# Patient Record
Sex: Female | Born: 1960 | Race: White | Hispanic: No | Marital: Married | Smoking: Never smoker
Health system: Southern US, Community
[De-identification: ages and names within clinical notes are randomized; demographics above are authoritative.]

## PROBLEM LIST (undated history)

## (undated) DIAGNOSIS — J069 Acute upper respiratory infection, unspecified: Secondary | ICD-10-CM

## (undated) DIAGNOSIS — M2242 Chondromalacia patellae, left knee: Secondary | ICD-10-CM

## (undated) DIAGNOSIS — J45909 Unspecified asthma, uncomplicated: Secondary | ICD-10-CM

## (undated) DIAGNOSIS — E7800 Pure hypercholesterolemia, unspecified: Secondary | ICD-10-CM

## (undated) DIAGNOSIS — N950 Postmenopausal bleeding: Secondary | ICD-10-CM

## (undated) DIAGNOSIS — Z87442 Personal history of urinary calculi: Secondary | ICD-10-CM

## (undated) DIAGNOSIS — M2241 Chondromalacia patellae, right knee: Secondary | ICD-10-CM

## (undated) HISTORY — DX: Chondromalacia patellae, left knee: M22.42

## (undated) HISTORY — PX: OTHER SURGICAL HISTORY: SHX169

## (undated) HISTORY — DX: Pure hypercholesterolemia, unspecified: E78.00

## (undated) HISTORY — DX: Chondromalacia patellae, right knee: M22.41

## (undated) HISTORY — DX: Unspecified asthma, uncomplicated: J45.909

---

## 1994-02-10 DIAGNOSIS — T8859XA Other complications of anesthesia, initial encounter: Secondary | ICD-10-CM

## 1994-02-10 HISTORY — DX: Other complications of anesthesia, initial encounter: T88.59XA

## 1996-02-11 HISTORY — PX: TUBAL LIGATION: SHX77

## 1998-10-31 ENCOUNTER — Other Ambulatory Visit: Admission: RE | Admit: 1998-10-31 | Discharge: 1998-10-31 | Payer: Self-pay | Admitting: Gynecology

## 1998-11-19 ENCOUNTER — Encounter: Admission: RE | Admit: 1998-11-19 | Discharge: 1999-02-17 | Payer: Self-pay | Admitting: Gynecology

## 2000-04-07 ENCOUNTER — Other Ambulatory Visit: Admission: RE | Admit: 2000-04-07 | Discharge: 2000-04-07 | Payer: Self-pay | Admitting: Gynecology

## 2001-04-07 ENCOUNTER — Other Ambulatory Visit: Admission: RE | Admit: 2001-04-07 | Discharge: 2001-04-07 | Payer: Self-pay | Admitting: Gynecology

## 2001-04-07 ENCOUNTER — Ambulatory Visit (HOSPITAL_COMMUNITY): Admission: RE | Admit: 2001-04-07 | Discharge: 2001-04-07 | Payer: Self-pay | Admitting: Gynecology

## 2001-04-07 ENCOUNTER — Encounter: Payer: Self-pay | Admitting: Gynecology

## 2002-04-11 ENCOUNTER — Other Ambulatory Visit: Admission: RE | Admit: 2002-04-11 | Discharge: 2002-04-11 | Payer: Self-pay | Admitting: Gynecology

## 2002-12-12 ENCOUNTER — Inpatient Hospital Stay (HOSPITAL_COMMUNITY): Admission: EM | Admit: 2002-12-12 | Discharge: 2002-12-15 | Payer: Self-pay | Admitting: Emergency Medicine

## 2003-09-01 ENCOUNTER — Other Ambulatory Visit: Admission: RE | Admit: 2003-09-01 | Discharge: 2003-09-01 | Payer: Self-pay | Admitting: Gynecology

## 2004-03-04 ENCOUNTER — Emergency Department (HOSPITAL_COMMUNITY): Admission: EM | Admit: 2004-03-04 | Discharge: 2004-03-04 | Payer: Self-pay | Admitting: Emergency Medicine

## 2004-10-01 ENCOUNTER — Other Ambulatory Visit: Admission: RE | Admit: 2004-10-01 | Discharge: 2004-10-01 | Payer: Self-pay | Admitting: Gynecology

## 2006-03-25 ENCOUNTER — Other Ambulatory Visit: Admission: RE | Admit: 2006-03-25 | Discharge: 2006-03-25 | Payer: Self-pay | Admitting: Gynecology

## 2006-05-21 ENCOUNTER — Emergency Department (HOSPITAL_COMMUNITY): Admission: EM | Admit: 2006-05-21 | Discharge: 2006-05-21 | Payer: Self-pay | Admitting: Emergency Medicine

## 2007-04-04 HISTORY — PX: ENDOMETRIAL ABLATION: SHX621

## 2007-05-10 ENCOUNTER — Ambulatory Visit (HOSPITAL_COMMUNITY): Admission: RE | Admit: 2007-05-10 | Discharge: 2007-05-10 | Payer: Self-pay | Admitting: Family Medicine

## 2007-05-14 ENCOUNTER — Other Ambulatory Visit: Admission: RE | Admit: 2007-05-14 | Discharge: 2007-05-14 | Payer: Self-pay | Admitting: Gynecology

## 2008-12-26 ENCOUNTER — Other Ambulatory Visit: Admission: RE | Admit: 2008-12-26 | Discharge: 2008-12-26 | Payer: Self-pay | Admitting: Gynecology

## 2008-12-26 ENCOUNTER — Encounter: Payer: Self-pay | Admitting: Gynecology

## 2008-12-26 ENCOUNTER — Ambulatory Visit: Payer: Self-pay | Admitting: Gynecology

## 2009-08-24 IMAGING — CT CT HEAD W/O CM
1 series · 16 of 30 positions shown, 20 images · non-contrast
Comparison: None

CLINICAL DATA: HEAD INJURY

CT HEAD WITHOUT CONTRAST
TECHNIQUE: Contiguous axial images were obtained from the base of
the skull through the vertex without contrast

[Series 2: headtrauma 4.8 h37s · axial · 0.45mm/px · z∈[+1268,+1406]mm · 16 of 30 slices shown, 20 images]
[im 2/30  brain]
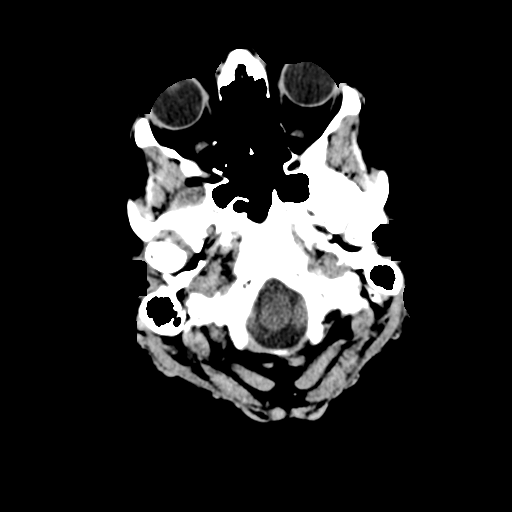
[im 2/30  bone]
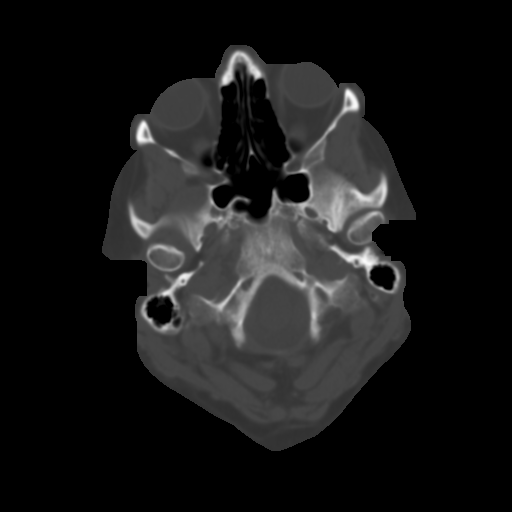
[im 4/30  brain]
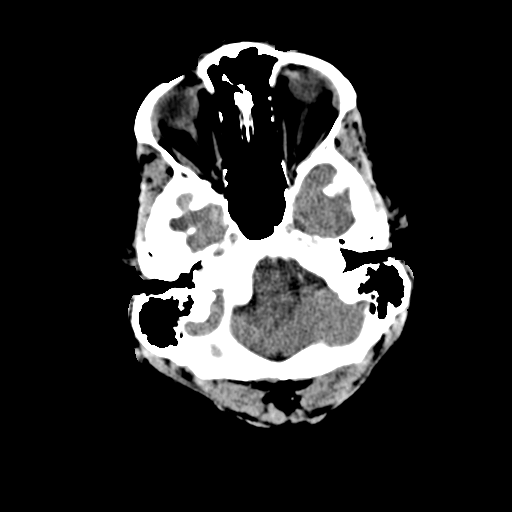
[im 6/30  brain]
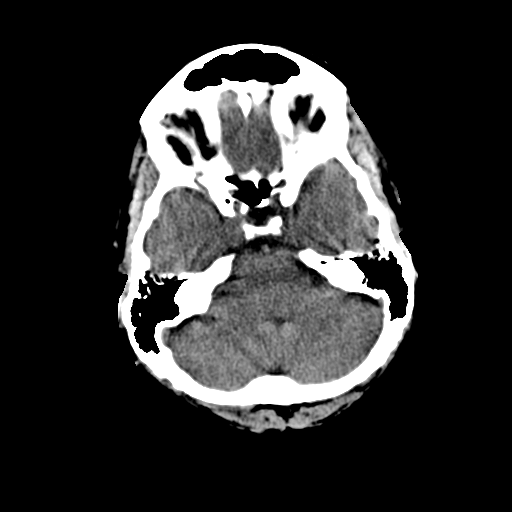
[im 8/30  brain]
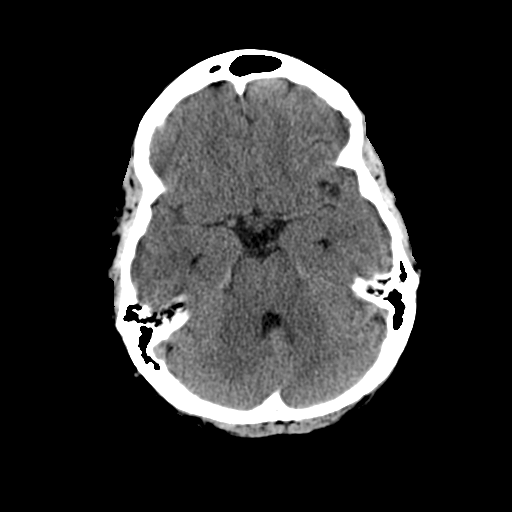
[im 9/30  brain]
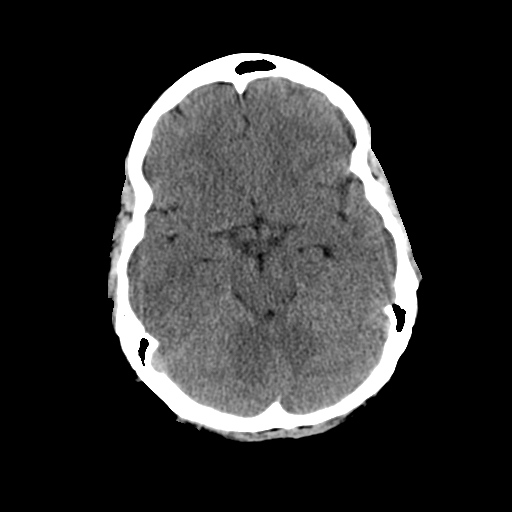
[im 9/30  bone]
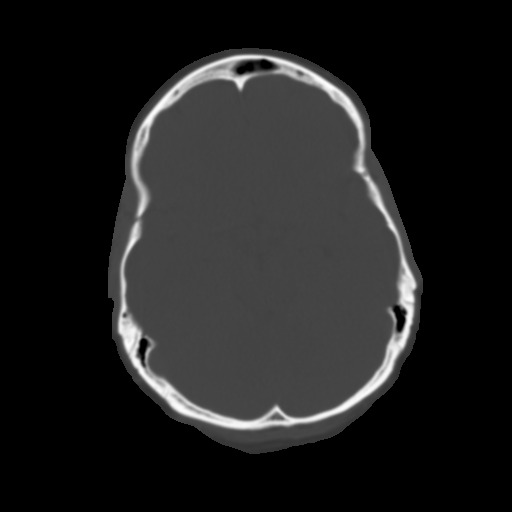
[im 11/30  brain]
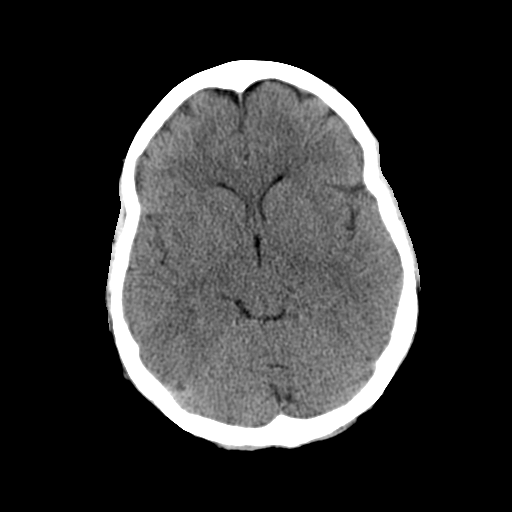
[im 13/30  brain]
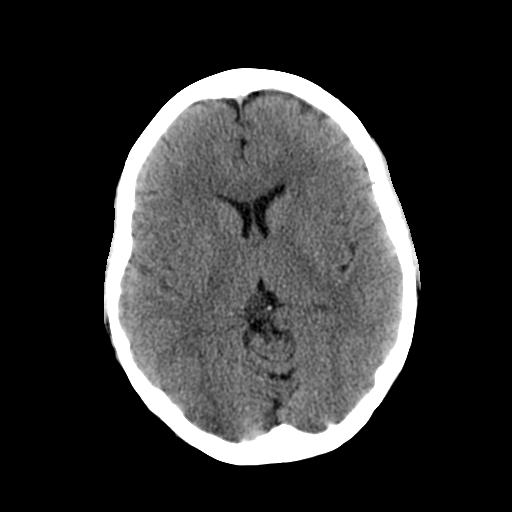
[im 15/30  brain]
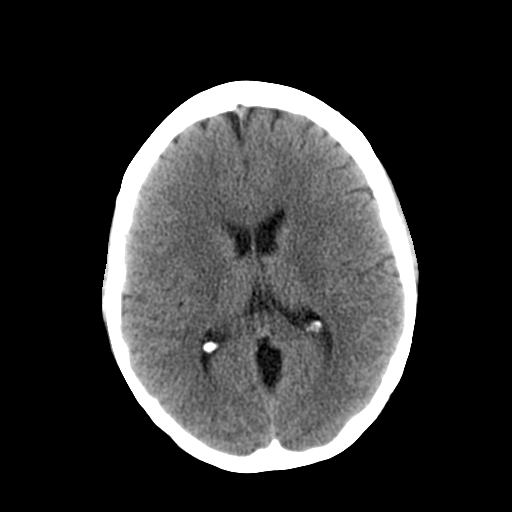
[im 16/30  brain]
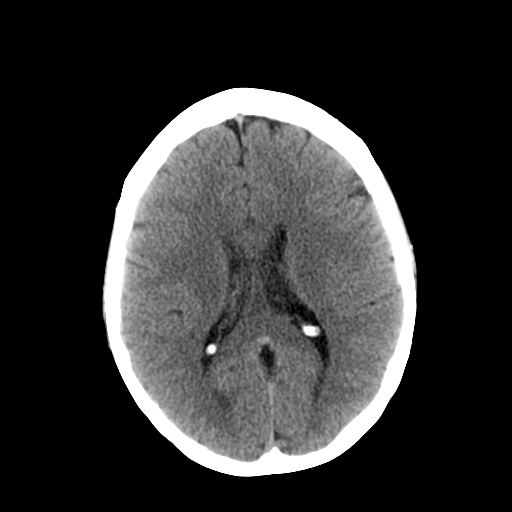
[im 16/30  bone]
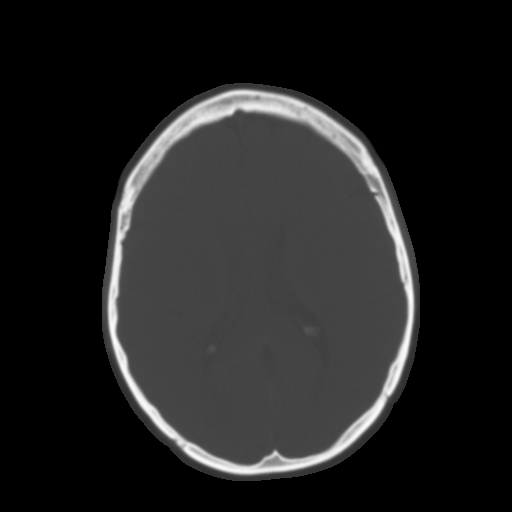
[im 18/30  brain]
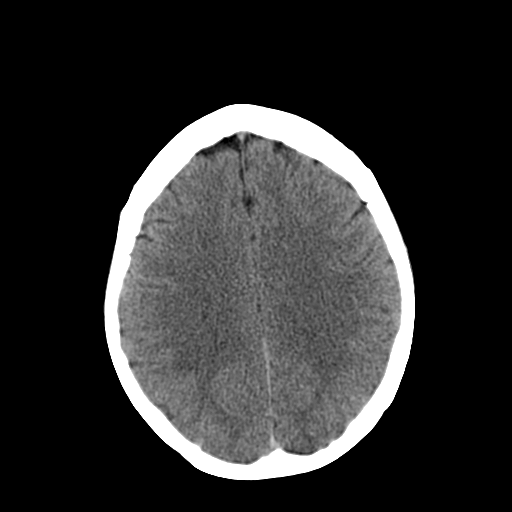
[im 20/30  brain]
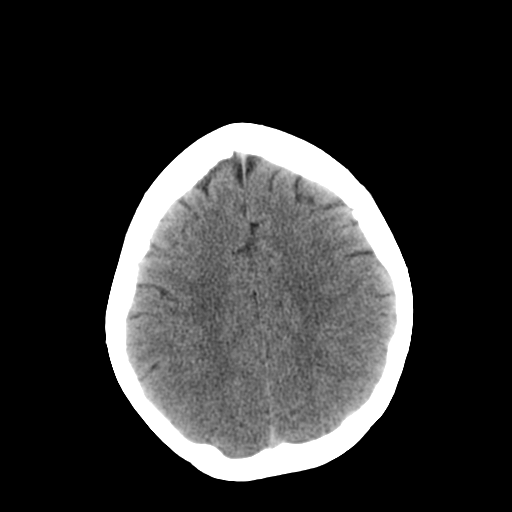
[im 22/30  brain]
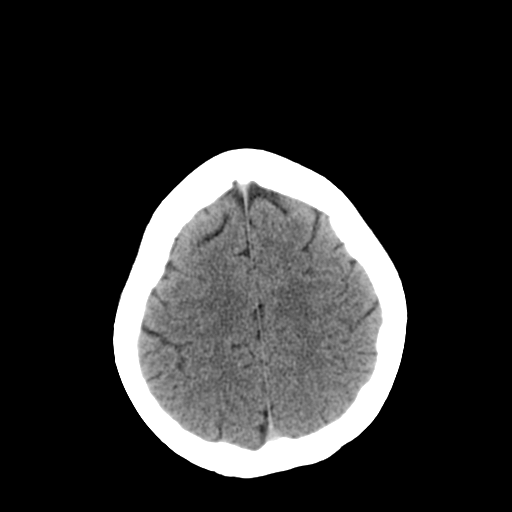
[im 23/30  brain]
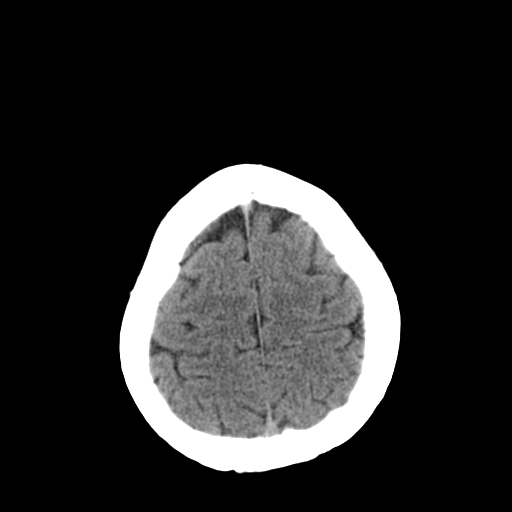
[im 23/30  bone]
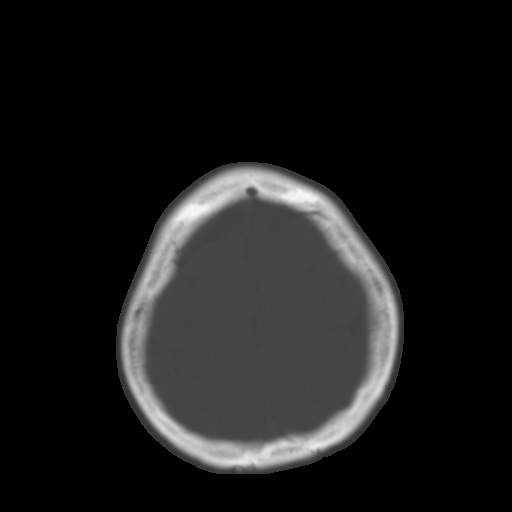
[im 25/30  brain]
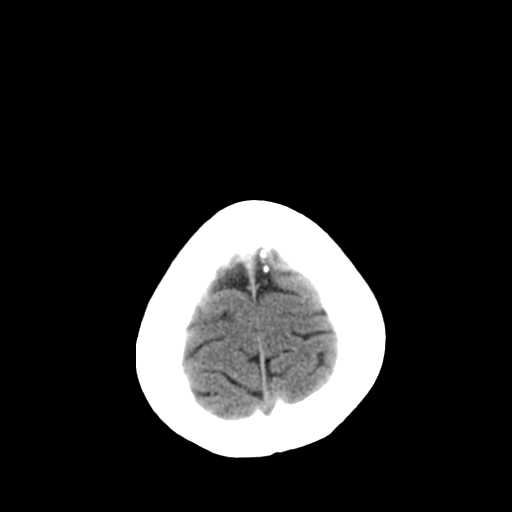
[im 27/30  brain]
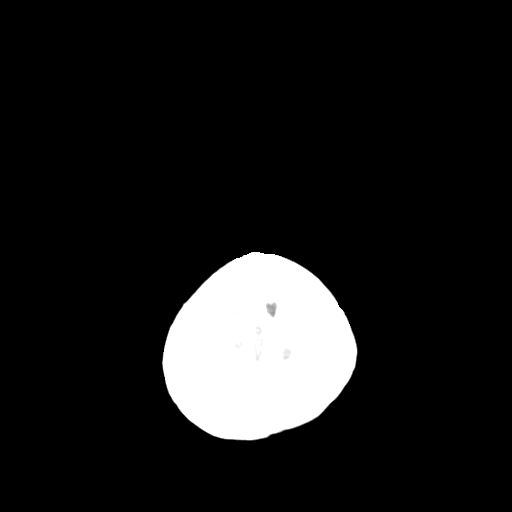
[im 29/30  brain]
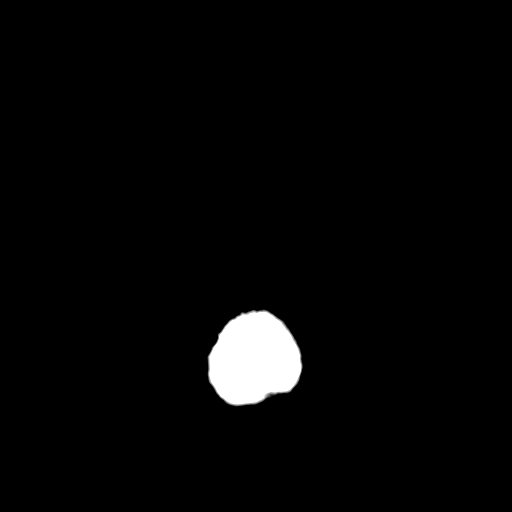

[16 of 30 positions shown; findings below may reference images not displayed]

FINDINGS: The brain has a normal appearance without evidence for
hemorrhage, acute infarction, hydrocephalus, or mass lesion.  There
is no extra axial fluid collection.  The skull and paranasal
sinuses are normal.
IMPRESSION: Normal CT of the head without contrast.

## 2009-08-24 IMAGING — CR DG CERVICAL SPINE COMPLETE 4+V
6 series · 6 of 6 positions shown · non-contrast
Comparison: None available

CLINICAL DATA: Headache, neck pain, hit head with to to see cover.

CERVICAL SPINE - COMPLETE 4+ VIEW

[view not recorded (1 of 6)]
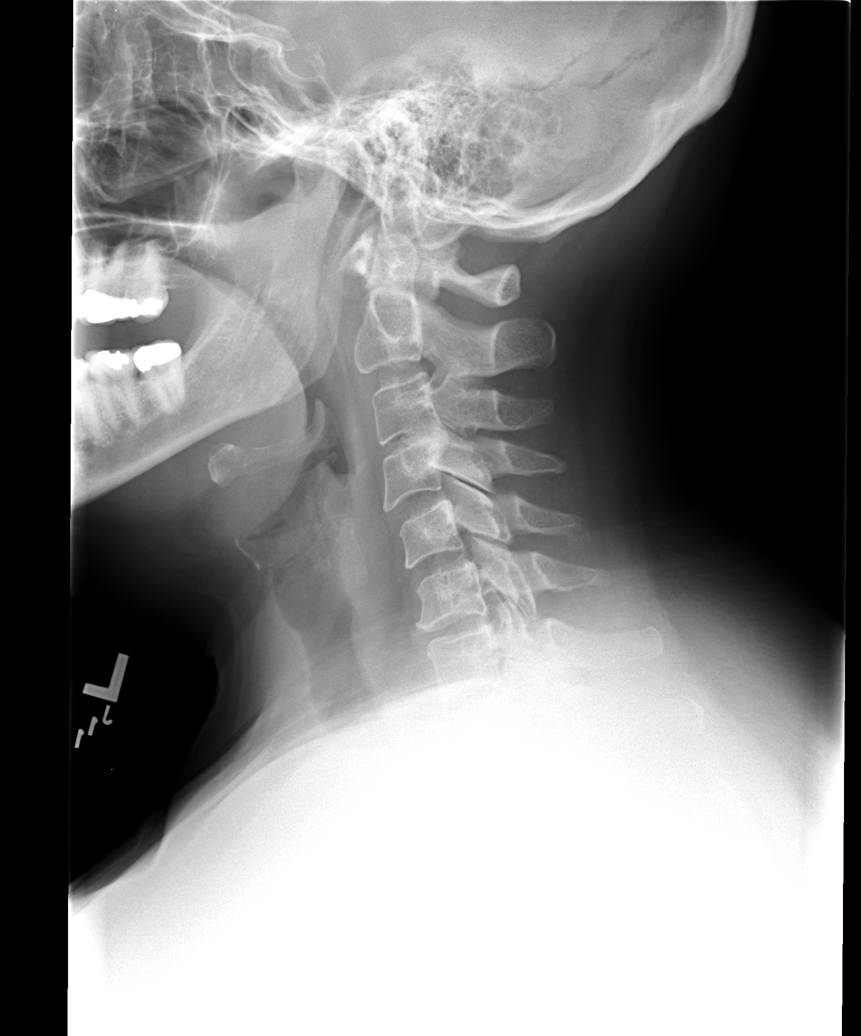

[view not recorded (2 of 6)]
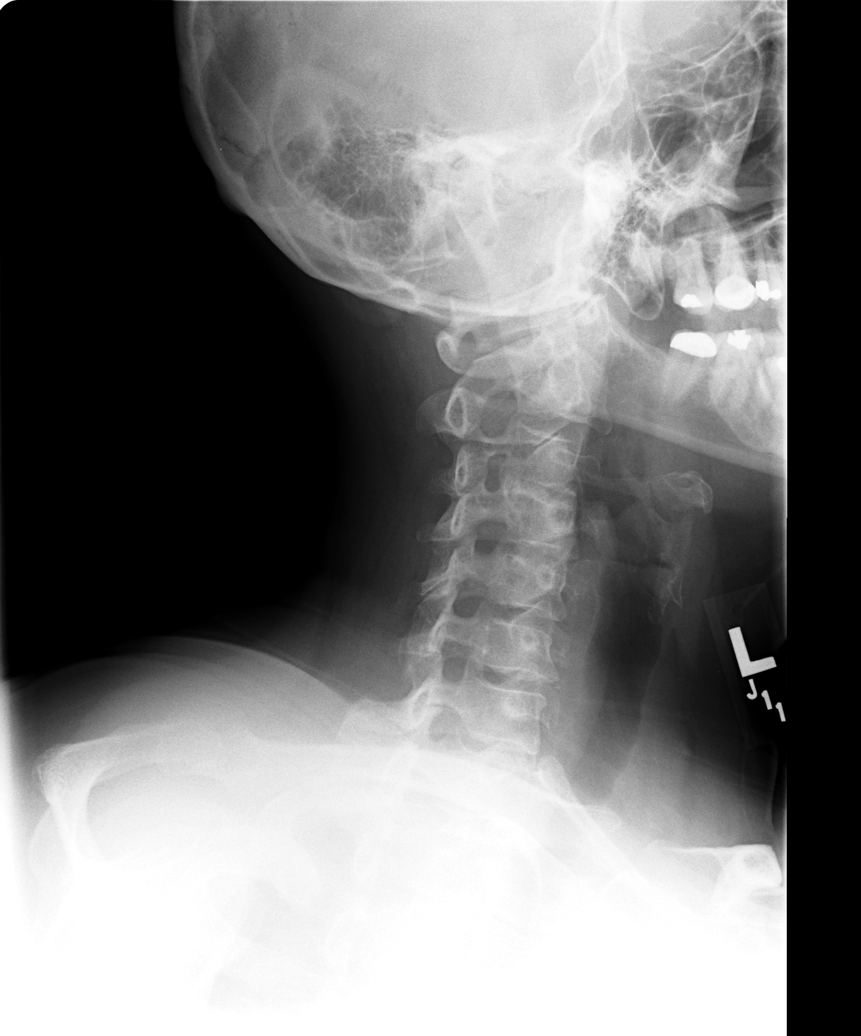

[view not recorded (3 of 6)]
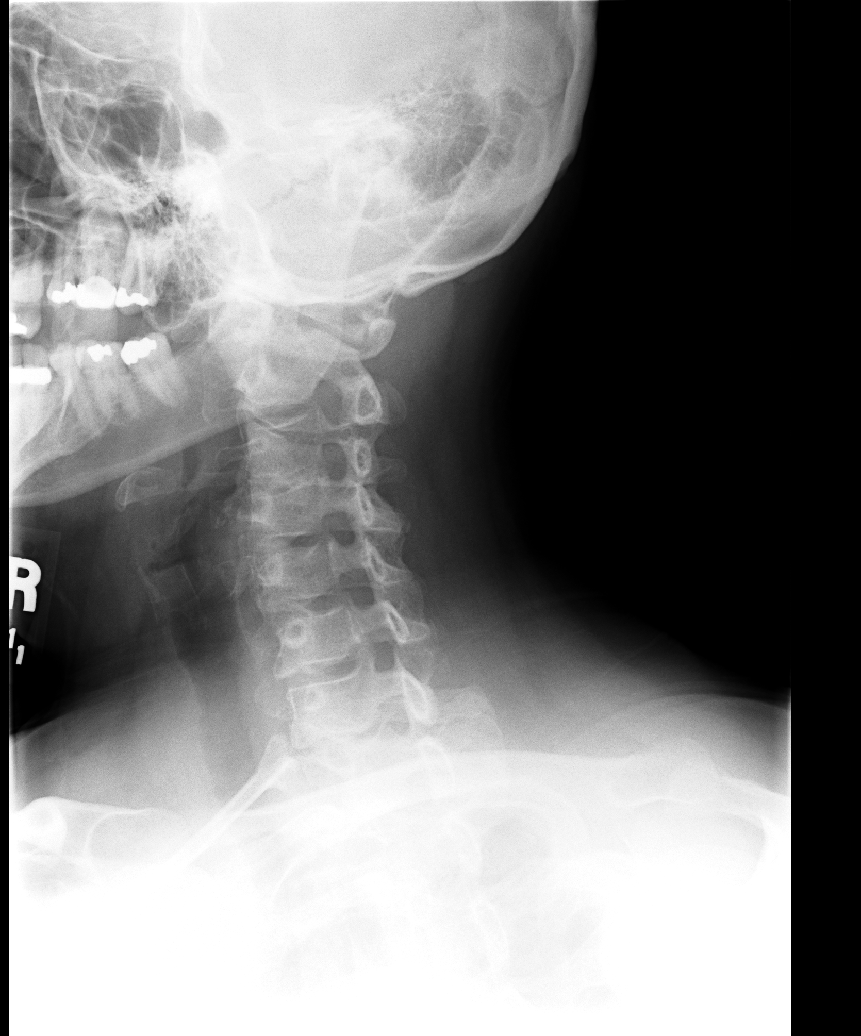

[view not recorded (4 of 6)]
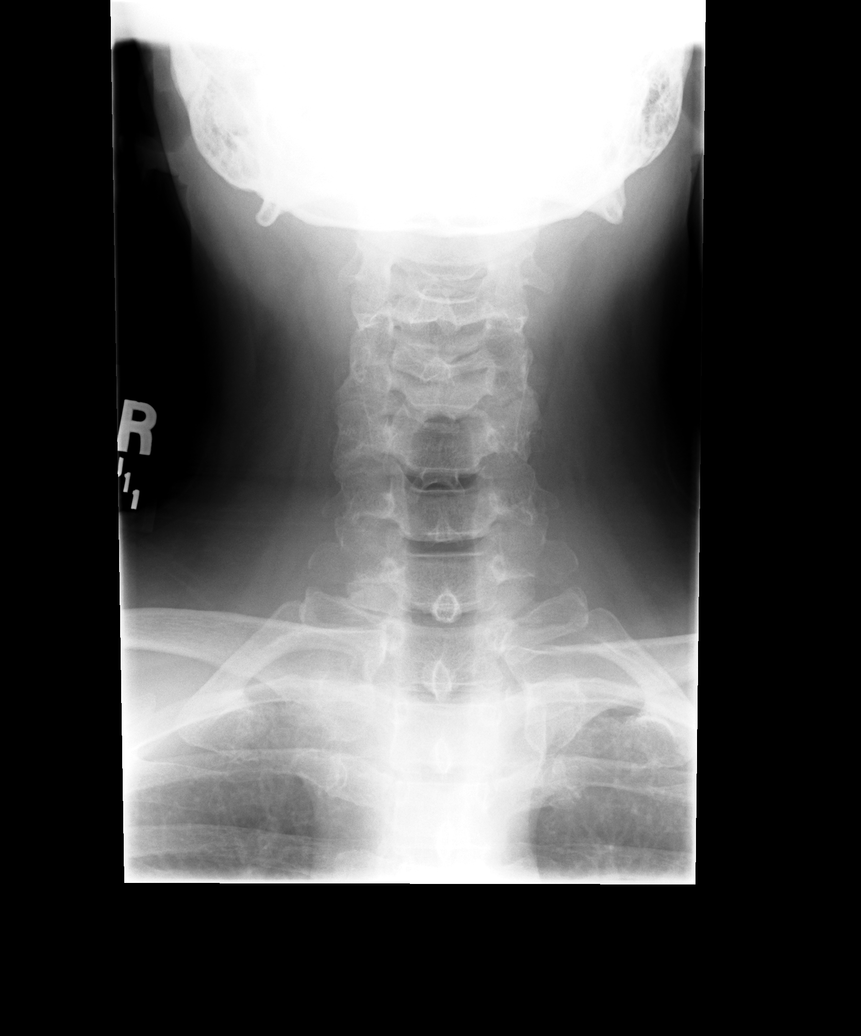

[view not recorded (5 of 6)]
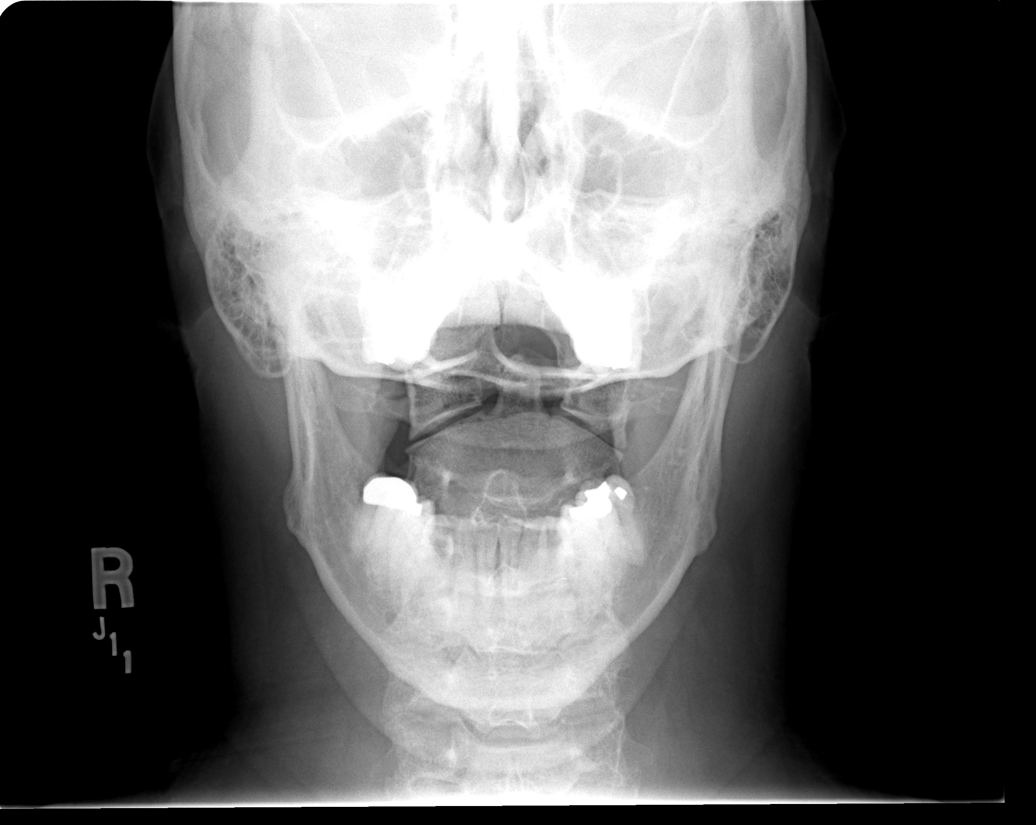

[view not recorded (6 of 6)]
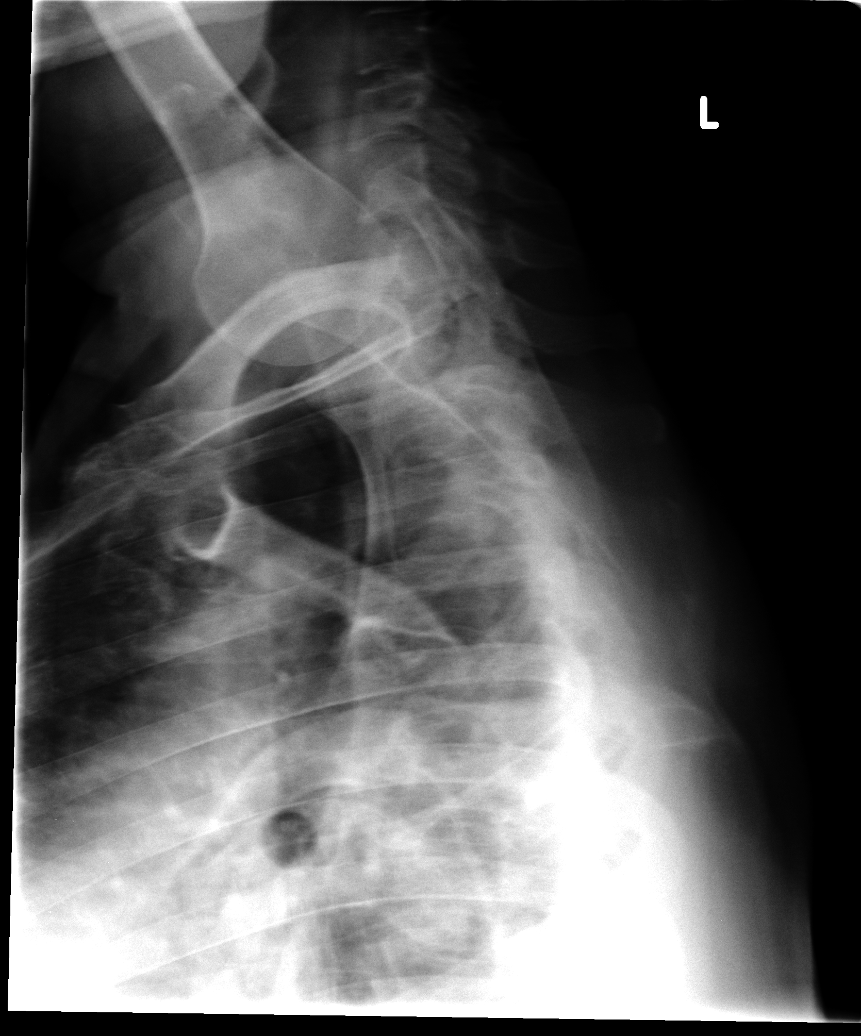

[6 of 6 positions shown; findings below may reference images not displayed]

FINDINGS: Alignment anatomic.  C6-C7 spondylosis, with minimal disc
space loss.  No fracture identified.  Prevertebral soft tissues
within normal limits.  No definite foraminal narrowing.  Odontoid
view unremarkable.  Cervicothoracic junction appears normal.
Swimmer's view nondiagnostic.
IMPRESSION: 1.  No osseous cervical spine trauma identified.

## 2010-02-06 ENCOUNTER — Other Ambulatory Visit
Admission: RE | Admit: 2010-02-06 | Discharge: 2010-02-06 | Payer: Self-pay | Source: Home / Self Care | Admitting: Gynecology

## 2010-02-06 ENCOUNTER — Ambulatory Visit: Payer: Self-pay | Admitting: Gynecology

## 2010-03-20 ENCOUNTER — Ambulatory Visit: Payer: Self-pay

## 2010-06-28 NOTE — H&P (Signed)
NAME:  Allison Frazier, Allison Frazier                         ACCOUNT NO.:  [id number]   MEDICAL RECORD NO.:  [id number]                   PATIENT TYPE:  INP   LOCATION:  A217                                 FACILITY:  [facility]   PHYSICIAN:  Corrie Mckusick, M.D.               DATE OF BIRTH:  16-Oct-1960   DATE OF ADMISSION:  12/12/2002  DATE OF DISCHARGE:                                HISTORY & PHYSICAL   PRIMARY PHYSICIAN:  Patrica Duel, M.D.   ADMISSION DIAGNOSIS:  Chest pain.   HISTORY OF PRESENTING ILLNESS:  The patient is a 50 year old female who was  on her treadmill and began to have tachycardia in the 150s, 160s.  She  stopped and checked her heart rate, and it was at that rate.  At that time  she had some left-sided chest pain which radiated to the left jaw and left  arm.  She also had some increase in her shortness of breath despite the fact  that she was already on the treadmill.  No nausea and no increased  diaphoresis that she could tell.   She has never had chest pain like this before.  She has never had exertional  angina of any kind.  She does have quite a strong family history of cardiac  disease in her father.  Lipids were elevated a few months ago in the 220  range when they were checked.  She came in the emergency department, and  initial ECG showed some slight ST changes.  It was prudent to admit her for  rule out and further risk stratification.   PAST MEDICAL HISTORY:  None.   PAST SURGICAL HISTORY:  1. C-section.  2. Tubal.   MEDICATIONS:  None.   ALLERGIES:  None.   PHYSICAL EXAM:  VITAL SIGNS:  Temperature 98.7, blood pressure 113/73, heart  rate 78, respiratory rate 20.  GENERAL:  When I saw the patient she was pleasant, talkative, in no acute  distress, and no chest pain.  HEENT:  Nasal and oropharynx are clear.  NECK:  No JVD.  CHEST:  Clear to auscultation bilaterally.  CARDIOVASCULAR:  Regular rate and rhythm.  Normal S1, S2.  No murmurs.  ABDOMEN:   Soft and nontender.  EXTREMITIES:  No cyanosis, no clubbing, no edema.   LABORATORIES:  CBC, Chem-12 normal.  Initial CPK, MB, troponin normal.   Chest x-ray negative per the emergency department report.   ECG on admission showed normal sinus rhythm, normal axis, normal intervals,  with just some nonspecific ST changes.   ASSESSMENT:  The patient is a 50 year old female with possible angina.   PLAN:  1. Admit for rule out myocardial infarction.  2. CPK, MB, troponin serially.  3. ECG daily.  4. Telemetry.  5. Aspirin 325 daily.  6. Hold on heparin for now.  7. Consult Dr. Domingo Sep with [facility].     ___________________________________________  Corrie Mckusick, M.D.   JCG/MEDQ  D:  12/13/2002  T:  12/13/2002  Job:  [id number]

## 2010-06-28 NOTE — Discharge Summary (Signed)
   NAME:  Allison Frazier, Allison Frazier                         ACCOUNT NO.:  [id number]   MEDICAL RECORD NO.:  [id number]                   PATIENT TYPE:  INP   LOCATION:  A217                                 FACILITY:  [facility]   PHYSICIAN:  Corrie Mckusick, M.D.               DATE OF BIRTH:  1960-05-30   DATE OF ADMISSION:  12/12/2002  DATE OF DISCHARGE:  12/15/2002                                 DISCHARGE SUMMARY   DISCHARGE DIAGNOSIS:  Chest pain, ruled out myocardial infarction.   For history of present illness and past medical history, please see admitted  H&P.   HOSPITAL COURSE:  A 50 year old female who was on the treadmill and began to  have tachycardia in the 150s-160s with some left-sided chest pain.  She was  admitted for rule out myocardial infarction with serial enzymes and  telemetry.  Dr. Domingo Sep was consulted from [facility].  Chest  CT was done which was negative.  Lovenox 1 mg/kg subcu q.12h. was started.  CPK and troponins were all negative x3.   Dr. Sherwood Gambler, on the 3rd, added a PPI and got a gallbladder ultrasound.  Gallbladder ultrasound showed a single stone and a hepatobiliary scan was  ordered which was negative.  She was set up as an outpatient with  [facility] for echocardiogram and nuclear stress test.  Low  dose beta blocker at 25 mg daily was started for discharge.  She was stable  and doing quite well and was ready for discharge on 12/15/02.  Again, follow  up with Dr. Domingo Sep on Monday, November 8, at 3 p.m.  She resumed her prior  hospitalization medications, along with baby aspirin 81 mg daily as well as  Toprol XL 25 mg daily.  She is to return if there is any chest pain  recurrence at all.   PHYSICAL EXAMINATION:  Discharge physical.  VITAL SIGNS:  She is afebrile.  Vital signs stable.  GENERAL:  A pleasant female in no acute distress.  CHEST:  Clear to auscultation bilaterally.  CARDIOVASCULAR:  Regular rate and rhythm, normal S1,  S2.  No murmurs.  ABDOMEN:  Soft, nontender.   CONDITION ON DISCHARGE:  Improved and stable.     ___________________________________________                                         Corrie Mckusick, M.D.   JCG/MEDQ  D:  12/22/2002  T:  12/22/2002  Job:  [id number]

## 2011-04-09 DIAGNOSIS — E7800 Pure hypercholesterolemia, unspecified: Secondary | ICD-10-CM | POA: Insufficient documentation

## 2011-04-16 ENCOUNTER — Encounter: Payer: Self-pay | Admitting: Gynecology

## 2011-04-30 ENCOUNTER — Ambulatory Visit (INDEPENDENT_AMBULATORY_CARE_PROVIDER_SITE_OTHER): Payer: 59 | Admitting: Gynecology

## 2011-04-30 ENCOUNTER — Encounter: Payer: Self-pay | Admitting: Gynecology

## 2011-04-30 VITALS — BP 128/92 | Ht 66.0 in | Wt 243.0 lb

## 2011-04-30 DIAGNOSIS — Z862 Personal history of diseases of the blood and blood-forming organs and certain disorders involving the immune mechanism: Secondary | ICD-10-CM

## 2011-04-30 DIAGNOSIS — R635 Abnormal weight gain: Secondary | ICD-10-CM

## 2011-04-30 DIAGNOSIS — N942 Vaginismus: Secondary | ICD-10-CM

## 2011-04-30 DIAGNOSIS — Z8639 Personal history of other endocrine, nutritional and metabolic disease: Secondary | ICD-10-CM

## 2011-04-30 DIAGNOSIS — Z01419 Encounter for gynecological examination (general) (routine) without abnormal findings: Secondary | ICD-10-CM

## 2011-04-30 LAB — GLUCOSE, RANDOM: Glucose, Bld: 97 mg/dL (ref 70–99)

## 2011-04-30 LAB — LIPID PANEL
Cholesterol: 238 mg/dL — ABNORMAL HIGH (ref 0–200)
HDL: 52 mg/dL (ref >39)
LDL Cholesterol: 155 mg/dL — ABNORMAL HIGH (ref 0–99)
Total CHOL/HDL Ratio: 4.6 Ratio
Triglycerides: 154 mg/dL — ABNORMAL HIGH (ref <150)
VLDL: 31 mg/dL (ref 0–40)

## 2011-04-30 LAB — CBC WITH DIFFERENTIAL/PLATELET
Basophils Absolute: 0.1 10*3/uL (ref 0.0–0.1)
Basophils Relative: 1 % (ref 0–1)
Eosinophils Absolute: 0.1 10*3/uL (ref 0.0–0.7)
Eosinophils Relative: 1 % (ref 0–5)
HCT: 45.4 % (ref 36.0–46.0)
Hemoglobin: 14.8 g/dL (ref 12.0–15.0)
Lymphocytes Relative: 25 % (ref 12–46)
Lymphs Abs: 1.6 10*3/uL (ref 0.7–4.0)
MCH: 28.2 pg (ref 26.0–34.0)
MCHC: 32.6 g/dL (ref 30.0–36.0)
MCV: 86.6 fL (ref 78.0–100.0)
Monocytes Absolute: 0.5 10*3/uL (ref 0.1–1.0)
Monocytes Relative: 8 % (ref 3–12)
Neutro Abs: 4.1 10*3/uL (ref 1.7–7.7)
Neutrophils Relative %: 66 % (ref 43–77)
Platelets: 273 10*3/uL (ref 150–400)
RBC: 5.24 MIL/uL — ABNORMAL HIGH (ref 3.87–5.11)
RDW: 14 % (ref 11.5–15.5)
WBC: 6.3 10*3/uL (ref 4.0–10.5)

## 2011-04-30 LAB — TSH: TSH: 0.782 u[IU]/mL (ref 0.350–4.500)

## 2011-04-30 MED ORDER — IBUPROFEN 800 MG PO TABS: 800.0000 mg | ORAL_TABLET | ORAL | Status: DC | PRN

## 2011-04-30 NOTE — Progress Notes (Signed)
Allison Frazier [birth year] [id number]   History:    51 y.o.  for annual exam who has had a previous tubal sterilization procedure and having normal cycles once a month sometimes lasting between 2-4 days. Her blood pressure today was 120/92. She stated her primary physician and placed on Zocor in the past because of hypercholesterolemia she took for 6 months but discontinued secondary to myalgias. She is taking omega 3 fish oil. Review of her record again she was weighing 2/39 of 242. Last Pap smear normal December 2011. Last mammogram para 2012. Patient does her monthly self breast examinations.  Past medical history,surgical history, family history and social history were all reviewed and documented in the EPIC chart.  Gynecologic History Patient's last menstrual period was 04/14/2011. Contraception: tubal ligation Last Pap: 2011. Results were: normal Last mammogram: 2012. Results were: normal  Obstetric History OB History    Grav Para Term Preterm Abortions TAB SAB Ect Mult Living   3 3 3       3      # Outc Date GA Lbr Len/2nd Wgt Sex Del Anes PTL Lv   1 TRM     F SVD  No Yes   2 TRM     M CS  No Yes   3 TRM     F CS  No Yes       ROS:  Was performed and pertinent positives and negatives are included in the history.  Exam: chaperone present  BP 128/92  Ht 5\' 6"  (1.676 m)  Wt 243 lb (110.224 kg)  BMI 39.22 kg/m2  LMP 04/14/2011  Body mass index is 39.22 kg/(m^2).  General appearance : Well developed well nourished female. No acute distress HEENT: Neck supple, trachea midline, no carotid bruits, no thyroidmegaly Lungs: Clear to auscultation, no rhonchi or wheezes, or rib retractions  Heart: Regular rate and rhythm, no murmurs or gallops Breast:Examined in sitting and supine position were symmetrical in appearance, no palpable masses or tenderness,  no skin retraction, no nipple inversion, no nipple discharge, no skin discoloration, no axillary or supraclavicular  lymphadenopathy Abdomen: no palpable masses or tenderness, no rebound or guarding Extremities: no edema or skin discoloration or tenderness  Pelvic:  Bartholin, Urethra, Skene Glands: Within normal limits             Vagina: No gross lesions or discharge  Cervix: No gross lesions or discharge  Uterus  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 female for annual exam will return back to the office within a week for an ultrasound for better assessment of her adnexa and her uterus did in fact that she is overweight and because of her vaginismus and adequate evaluation was not possible. She is encouraged her monthly self breast examination. Requisition to schedule her mammogram and colonoscopy were provided as well today. The following labs will be drawn today fasting lipid profile, CBC, TSH, fasting blood sugar and urinalysis. We discussed the new Pap smear screening guidelines since her last Pap was in 2011 her her next pack to schedule for 2014. Repeat blood pressure 120/86.   Ok Edwards MD, 10:50 AM 04/30/2011

## 2011-05-01 ENCOUNTER — Other Ambulatory Visit: Payer: Self-pay | Admitting: *Deleted

## 2011-05-01 DIAGNOSIS — E7800 Pure hypercholesterolemia, unspecified: Secondary | ICD-10-CM

## 2011-05-01 LAB — URINALYSIS W MICROSCOPIC + REFLEX CULTURE
Bilirubin Urine: NEGATIVE
Casts: NONE SEEN
Crystals: NONE SEEN
Glucose, UA: NEGATIVE mg/dL
Hgb urine dipstick: NEGATIVE
Ketones, ur: NEGATIVE mg/dL
Leukocytes, UA: NEGATIVE
Nitrite: NEGATIVE
Protein, ur: NEGATIVE mg/dL
Specific Gravity, Urine: 1.026 (ref 1.005–1.030)
Urobilinogen, UA: 0.2 mg/dL (ref 0.0–1.0)
pH: 6 (ref 5.0–8.0)

## 2011-05-02 ENCOUNTER — Other Ambulatory Visit: Payer: Self-pay | Admitting: *Deleted

## 2011-05-02 DIAGNOSIS — R718 Other abnormality of red blood cells: Secondary | ICD-10-CM

## 2011-05-02 LAB — URINE CULTURE
Colony Count: NO GROWTH
Organism ID, Bacteria: NO GROWTH

## 2011-05-05 ENCOUNTER — Ambulatory Visit (INDEPENDENT_AMBULATORY_CARE_PROVIDER_SITE_OTHER): Payer: 59

## 2011-05-05 ENCOUNTER — Other Ambulatory Visit: Payer: Self-pay | Admitting: Gynecology

## 2011-05-05 ENCOUNTER — Ambulatory Visit (INDEPENDENT_AMBULATORY_CARE_PROVIDER_SITE_OTHER): Payer: 59 | Admitting: Gynecology

## 2011-05-05 DIAGNOSIS — E65 Localized adiposity: Secondary | ICD-10-CM

## 2011-05-05 DIAGNOSIS — N831 Corpus luteum cyst of ovary, unspecified side: Secondary | ICD-10-CM

## 2011-05-05 DIAGNOSIS — D252 Subserosal leiomyoma of uterus: Secondary | ICD-10-CM

## 2011-05-05 DIAGNOSIS — N8300 Follicular cyst of ovary, unspecified side: Secondary | ICD-10-CM

## 2011-05-05 DIAGNOSIS — D259 Leiomyoma of uterus, unspecified: Secondary | ICD-10-CM

## 2011-05-05 DIAGNOSIS — D251 Intramural leiomyoma of uterus: Secondary | ICD-10-CM

## 2011-05-05 DIAGNOSIS — R718 Other abnormality of red blood cells: Secondary | ICD-10-CM

## 2011-05-05 DIAGNOSIS — N942 Vaginismus: Secondary | ICD-10-CM

## 2011-05-05 NOTE — Patient Instructions (Signed)
Remember to get your mammogram and in 6 months to get your fasting labs and follow up with your GP. See attached low cholesterol diet:                                                   Cholesterol Control Diet  Cholesterol levels in your body are determined significantly by your diet. Cholesterol levels may also be related to heart disease. The following material helps to explain this relationship and discusses what you can do to help keep your heart healthy. Not all cholesterol is bad. Low-density lipoprotein (LDL) cholesterol is the "bad" cholesterol. It may cause fatty deposits to build up inside your arteries. High-density lipoprotein (HDL) cholesterol is "good." It helps to remove the "bad" LDL cholesterol from your blood. Cholesterol is a very important risk factor for heart disease. Other risk factors are high blood pressure, smoking, stress, heredity, and weight. The heart muscle gets its supply of blood through the coronary arteries. If your LDL cholesterol is high and your HDL cholesterol is low, you are at risk for having fatty deposits build up in your coronary arteries. This leaves less room through which blood can flow. Without sufficient blood and oxygen, the heart muscle cannot function properly and you may feel chest pains (angina pectoris). When a coronary artery closes up entirely, a part of the heart muscle may die, causing a heart attack (myocardial infarction). CHECKING CHOLESTEROL When your caregiver sends your blood to a lab to be analyzed for cholesterol, a complete lipid (fat) profile may be done. With this test, the total amount of cholesterol and levels of LDL and HDL are determined. Triglycerides are a type of fat that circulates in the blood and can also be used to determine heart disease risk. The list below describes what the numbers should be: Test: Total Cholesterol.  Less than 200 mg/dl.  Test: LDL "bad cholesterol."  Less than 100 mg/dl.   Less than 70 mg/dl if you  are at very high risk of a heart attack or sudden cardiac death.  Test: HDL "good cholesterol."  Greater than 50 mg/dl for women.   Greater than 40 mg/dl for men.  Test: Triglycerides.  Less than 150 mg/dl.  CONTROLLING CHOLESTEROL WITH DIET Although exercise and lifestyle factors are important, your diet is key. That is because certain foods are known to raise cholesterol and others to lower it. The goal is to balance foods for their effect on cholesterol and more importantly, to replace saturated and trans fat with other types of fat, such as monounsaturated fat, polyunsaturated fat, and omega-3 fatty acids. On average, a person should consume no more than 15 to 17 g of saturated fat daily. Saturated and trans fats are considered "bad" fats, and they will raise LDL cholesterol. Saturated fats are primarily found in animal products such as meats, butter, and cream. However, that does not mean you need to sacrifice all your favorite foods. Today, there are good tasting, low-fat, low-cholesterol substitutes for most of the things you like to eat. Choose low-fat or nonfat alternatives. Choose round or loin cuts of red meat, since these types of cuts are lowest in fat and cholesterol. Chicken (without the skin), fish, veal, and ground [place] breast are excellent choices. Eliminate fatty meats, such as hot dogs and salami. Even shellfish have little or no  saturated fat. Have a 3 oz (85 g) portion when you eat lean meat, poultry, or fish. Trans fats are also called "partially hydrogenated oils." They are oils that have been scientifically manipulated so that they are solid at room temperature resulting in a longer shelf life and improved taste and texture of foods in which they are added. Trans fats are found in stick margarine, some tub margarines, cookies, crackers, and baked goods.  When baking and cooking, oils are an excellent substitute for butter. The monounsaturated oils are especially beneficial  since it is believed they lower LDL and raise HDL. The oils you should avoid entirely are saturated tropical oils, such as coconut and palm.  Remember to eat liberally from food groups that are naturally free of saturated and trans fat, including fish, fruit, vegetables, beans, grains (barley, rice, couscous, bulgur wheat), and pasta (without cream sauces).  IDENTIFYING FOODS THAT LOWER CHOLESTEROL  Soluble fiber may lower your cholesterol. This type of fiber is found in fruits such as apples, vegetables such as broccoli, potatoes, and carrots, legumes such as beans, peas, and lentils, and grains such as barley. Foods fortified with plant sterols (phytosterol) may also lower cholesterol. You should eat at least 2 g per day of these foods for a cholesterol lowering effect.  Read package labels to identify low-saturated fats, trans fats free, and low-fat foods at the supermarket. Select cheeses that have only 2 to 3 g saturated fat per ounce. Use a heart-healthy tub margarine that is free of trans fats or partially hydrogenated oil. When buying baked goods (cookies, crackers), avoid partially hydrogenated oils. Breads and muffins should be made from whole grains (whole-wheat or whole oat flour, instead of "flour" or "enriched flour"). Buy non-creamy canned soups with reduced salt and no added fats.  FOOD PREPARATION TECHNIQUES  Never deep-fry. If you must fry, either stir-fry, which uses very little fat, or use non-stick cooking sprays. When possible, broil, bake, or roast meats, and steam vegetables. Instead of dressing vegetables with butter or margarine, use lemon and herbs, applesauce and cinnamon (for squash and sweet potatoes), nonfat yogurt, salsa, and low-fat dressings for salads.  LOW-SATURATED FAT / LOW-FAT FOOD SUBSTITUTES Meats / Saturated Fat (g)  Avoid: Steak, marbled (3 oz/85 g) / 11 g   Choose: Steak, lean (3 oz/85 g) / 4 g   Avoid: Hamburger (3 oz/85 g) / 7 g   Choose: Hamburger, lean  (3 oz/85 g) / 5 g   Avoid: Ham (3 oz/85 g) / 6 g   Choose: Ham, lean cut (3 oz/85 g) / 2.4 g   Avoid: Chicken, with skin, dark meat (3 oz/85 g) / 4 g   Choose: Chicken, skin removed, dark meat (3 oz/85 g) / 2 g   Avoid: Chicken, with skin, light meat (3 oz/85 g) / 2.5 g   Choose: Chicken, skin removed, light meat (3 oz/85 g) / 1 g  Dairy / Saturated Fat (g)  Avoid: Whole milk (1 cup) / 5 g   Choose: Low-fat milk, 2% (1 cup) / 3 g   Choose: Low-fat milk, 1% (1 cup) / 1.5 g   Choose: Skim milk (1 cup) / 0.3 g   Avoid: Hard cheese (1 oz/28 g) / 6 g   Choose: Skim milk cheese (1 oz/28 g) / 2 to 3 g   Avoid: Cottage cheese, 4% fat (1 cup) / 6.5 g   Choose: Low-fat cottage cheese, 1% fat (1 cup) / 1.5 g  Avoid: Ice cream (1 cup) / 9 g   Choose: Sherbet (1 cup) / 2.5 g   Choose: Nonfat frozen yogurt (1 cup) / 0.3 g   Choose: Frozen fruit bar / trace   Avoid: Whipped cream (1 tbs) / 3.5 g   Choose: Nondairy whipped topping (1 tbs) / 1 g  Condiments / Saturated Fat (g)  Avoid: Mayonnaise (1 tbs) / 2 g   Choose: Low-fat mayonnaise (1 tbs) / 1 g   Avoid: Butter (1 tbs) / 7 g   Choose: Extra light margarine (1 tbs) / 1 g   Avoid: Coconut oil (1 tbs) / 11.8 g   Choose: Olive oil (1 tbs) / 1.8 g   Choose: Corn oil (1 tbs) / 1.7 g   Choose: Safflower oil (1 tbs) / 1.2 g   Choose: Sunflower oil (1 tbs) / 1.4 g   Choose: Soybean oil (1 tbs) / 2.4 g   Choose: Canola oil (1 tbs) / 1 g  Document Released: 01/27/2005 Document Revised: 10/09/2010 Document Reviewed: 07/18/2010 [facility] Patient Information 2012 [place], [place].  Exercise to Lose Weight Exercise and a healthy diet may help you lose weight. Your doctor may suggest specific exercises. EXERCISE IDEAS AND TIPS  Choose low-cost things you enjoy doing, such as walking, bicycling, or exercising to workout videos.   Take stairs instead of the elevator.   Walk during your lunch break.   Park your car  further away from work or school.   Go to a gym or an exercise class.   Start with 5 to 10 minutes of exercise each day. Build up to 30 minutes of exercise 4 to 6 days a week.   Wear shoes with good support and comfortable clothes.   Stretch before and after working out.   Work out until you breathe harder and your heart beats faster.   Drink extra water when you exercise.   Do not do so much that you hurt yourself, feel dizzy, or get very short of breath.  Exercises that burn about 150 calories:  Running 1  miles in 15 minutes.   Playing volleyball for 45 to 60 minutes.   Washing and waxing a car for 45 to 60 minutes.   Playing touch football for 45 minutes.   Walking 1  miles in 35 minutes.   Pushing a stroller 1  miles in 30 minutes.   Playing basketball for 30 minutes.   Raking leaves for 30 minutes.   Bicycling 5 miles in 30 minutes.   Walking 2 miles in 30 minutes.   Dancing for 30 minutes.   Shoveling snow for 15 minutes.   Swimming laps for 20 minutes.   Walking up stairs for 15 minutes.   Bicycling 4 miles in 15 minutes.   Gardening for 30 to 45 minutes.   Jumping rope for 15 minutes.   Washing windows or floors for 45 to 60 minutes.  Document Released: 03/01/2010 Document Revised: 10/09/2010 Document Reviewed: 03/01/2010 [facility] Patient Information 2012 [place], [place].

## 2011-05-05 NOTE — Progress Notes (Signed)
Patient is a 51 year old who was seen in the office on March 20 for her annual gynecological examination. Patient has had a prior tubal sterilization procedure is having normal menstrual cycle. Due to her vaginismus and her abdominal girth and adequate pelvic exam was not possible and she was asked to return today for an ultrasound.  Ultrasound report: Uterus measures 11.8 x 7.3 x 4.6 cm endometrial stripe 11.2 mm (patient on day 22 of cycle) patient with several subserosal and intramural myomas the largest one measuring 14 x 15 mm. Right ovary with follicle left ovary corpus luteum cyst measuring 20 x 18 mm negative color flow.  Patient with past history of hypercholesterolemia her primary physician and placed on Zocor she took it for short time because of myalgias. Lipid profile done here in the office on March 20 with total cholesterol elevated 239, triglycerides were elevated at 154 and her LDL was elevated at 155. Her HDL was normal at 52 and her VLDL was normal at 31 and her cholesterol/HDL ratio was normal at 4.6. It was recommended she followup with her internist in 6 months which is due for an exam and blood work and consider a different type a statin. We'll provide her with cholesterol-lowering diet as well as recommendation for a regular exercise. She was reassured that these fibroids are small and since she is asymptomatic we'll followup with an ultrasound in one year. Of note her recent fasting blood sugar was normal at 97. Her urinalysis did demonstrate 7-10 RBCs and she left the office for a urine sample today we'll be checking again today.

## 2011-05-06 LAB — URINALYSIS W MICROSCOPIC + REFLEX CULTURE
Bacteria, UA: NONE SEEN
Bilirubin Urine: NEGATIVE
Casts: NONE SEEN
Crystals: NONE SEEN
Glucose, UA: NEGATIVE mg/dL
Hgb urine dipstick: NEGATIVE
Ketones, ur: NEGATIVE mg/dL
Leukocytes, UA: NEGATIVE
Nitrite: NEGATIVE
Protein, ur: NEGATIVE mg/dL
Specific Gravity, Urine: 1.026 (ref 1.005–1.030)
Urobilinogen, UA: 0.2 mg/dL (ref 0.0–1.0)
pH: 6.5 (ref 5.0–8.0)

## 2011-05-13 ENCOUNTER — Encounter: Payer: Self-pay | Admitting: Gynecology

## 2011-06-03 ENCOUNTER — Ambulatory Visit: Payer: Self-pay | Admitting: Gynecology

## 2011-06-06 ENCOUNTER — Encounter: Payer: Self-pay | Admitting: Gynecology

## 2011-08-22 ENCOUNTER — Ambulatory Visit: Payer: Self-pay | Admitting: Unknown Physician Specialty

## 2011-09-02 ENCOUNTER — Encounter: Payer: Self-pay | Admitting: Gynecology

## 2012-05-16 ENCOUNTER — Other Ambulatory Visit: Payer: Self-pay | Admitting: Gynecology

## 2012-05-17 NOTE — Telephone Encounter (Signed)
Pt will be called to schedule her annual exam.

## 2012-06-10 ENCOUNTER — Encounter: Payer: Self-pay | Admitting: Gynecology

## 2012-06-10 ENCOUNTER — Ambulatory Visit (INDEPENDENT_AMBULATORY_CARE_PROVIDER_SITE_OTHER): Payer: 59 | Admitting: Gynecology

## 2012-06-10 ENCOUNTER — Other Ambulatory Visit (HOSPITAL_COMMUNITY)
Admission: RE | Admit: 2012-06-10 | Discharge: 2012-06-10 | Disposition: A | Discharge status: Home / Self Care | Payer: 59 | Source: Ambulatory Visit | Attending: Gynecology | Admitting: Gynecology

## 2012-06-10 VITALS — BP 124/82 | Ht 65.0 in | Wt 243.0 lb

## 2012-06-10 DIAGNOSIS — Z01419 Encounter for gynecological examination (general) (routine) without abnormal findings: Secondary | ICD-10-CM

## 2012-06-10 DIAGNOSIS — R635 Abnormal weight gain: Secondary | ICD-10-CM

## 2012-06-10 DIAGNOSIS — Z1151 Encounter for screening for human papillomavirus (HPV): Secondary | ICD-10-CM | POA: Insufficient documentation

## 2012-06-10 DIAGNOSIS — E785 Hyperlipidemia, unspecified: Secondary | ICD-10-CM

## 2012-06-10 DIAGNOSIS — N915 Oligomenorrhea, unspecified: Secondary | ICD-10-CM

## 2012-06-10 DIAGNOSIS — N951 Menopausal and female climacteric states: Secondary | ICD-10-CM

## 2012-06-10 DIAGNOSIS — D259 Leiomyoma of uterus, unspecified: Secondary | ICD-10-CM

## 2012-06-10 LAB — CBC WITH DIFFERENTIAL/PLATELET
Basophils Absolute: 0 10*3/uL (ref 0.0–0.1)
Basophils Relative: 1 % (ref 0–1)
Eosinophils Absolute: 0.1 10*3/uL (ref 0.0–0.7)
Eosinophils Relative: 2 % (ref 0–5)
HCT: 42.4 % (ref 36.0–46.0)
Hemoglobin: 14 g/dL (ref 12.0–15.0)
Lymphocytes Relative: 27 % (ref 12–46)
Lymphs Abs: 1.5 10*3/uL (ref 0.7–4.0)
MCH: 27.5 pg (ref 26.0–34.0)
MCHC: 33 g/dL (ref 30.0–36.0)
MCV: 83.1 fL (ref 78.0–100.0)
Monocytes Absolute: 0.4 10*3/uL (ref 0.1–1.0)
Monocytes Relative: 7 % (ref 3–12)
Neutro Abs: 3.5 10*3/uL (ref 1.7–7.7)
Neutrophils Relative %: 63 % (ref 43–77)
Platelets: 308 10*3/uL (ref 150–400)
RBC: 5.1 MIL/uL (ref 3.87–5.11)
RDW: 14.3 % (ref 11.5–15.5)
WBC: 5.4 10*3/uL (ref 4.0–10.5)

## 2012-06-10 LAB — COMPREHENSIVE METABOLIC PANEL
ALT: 20 U/L (ref 0–35)
AST: 16 U/L (ref 0–37)
Albumin: 4.3 g/dL (ref 3.5–5.2)
Alkaline Phosphatase: 73 U/L (ref 39–117)
BUN: 12 mg/dL (ref 6–23)
CO2: 26 mEq/L (ref 19–32)
Calcium: 10.1 mg/dL (ref 8.4–10.5)
Chloride: 104 mEq/L (ref 96–112)
Creat: 0.76 mg/dL (ref 0.50–1.10)
Glucose, Bld: 98 mg/dL (ref 70–99)
Potassium: 4.7 mEq/L (ref 3.5–5.3)
Sodium: 138 mEq/L (ref 135–145)
Total Bilirubin: 0.5 mg/dL (ref 0.3–1.2)
Total Protein: 6.6 g/dL (ref 6.0–8.3)

## 2012-06-10 LAB — LIPID PANEL
Cholesterol: 204 mg/dL — ABNORMAL HIGH (ref 0–200)
HDL: 58 mg/dL (ref >39)
LDL Cholesterol: 129 mg/dL — ABNORMAL HIGH (ref 0–99)
Total CHOL/HDL Ratio: 3.5 Ratio
Triglycerides: 84 mg/dL (ref <150)
VLDL: 17 mg/dL (ref 0–40)

## 2012-06-10 LAB — TSH: TSH: 0.854 u[IU]/mL (ref 0.350–4.500)

## 2012-06-10 NOTE — Patient Instructions (Addendum)
Perimenopause Perimenopause is the time when your body begins to move into the menopause (no menstrual period for 12 straight months). It is a natural process. Perimenopause can begin 2 to 8 years before the menopause and usually lasts for one year after the menopause. During this time, your ovaries may or may not produce an egg. The ovaries vary in their production of estrogen and progesterone hormones each month. This can cause irregular menstrual periods, difficulty in getting pregnant, vaginal bleeding between periods and uncomfortable symptoms. CAUSES  Irregular production of the ovarian hormones, estrogen and progesterone, and not ovulating every month.  Other causes include:  Tumor of the pituitary gland in the brain.  Medical disease that affects the ovaries.  Radiation treatment.  Chemotherapy.  Unknown causes.  Heavy smoking and excessive alcohol intake can bring on perimenopause sooner. SYMPTOMS   Hot flashes.  Night sweats.  Irregular menstrual periods.  Decrease sex drive.  Vaginal dryness.  Headaches.  Mood swings.  Depression.  Memory problems.  Irritability.  Tiredness.  Weight gain.  Trouble getting pregnant.  The beginning of losing bone cells (osteoporosis).  The beginning of hardening of the arteries (atherosclerosis). DIAGNOSIS  Your caregiver will make a diagnosis by analyzing your age, menstrual history and your symptoms. They will do a physical exam noting any changes in your body, especially your female organs. Female hormone tests may or may not be helpful depending on the amount and when you produce the female hormones. However, other hormone tests may be helpful (ex. thyroid hormone) to rule out other problems. TREATMENT  The decision to treat during the perimenopause should be made by you and your caregiver depending on how the symptoms are affecting you and your life style. There are various treatments available such as:  Treating  individual symptoms with a specific medication for that symptom (ex. tranquilizer for depression).  Herbal medications that can help specific symptoms.  Counseling.  Group therapy.  No treatment. HOME CARE INSTRUCTIONS   Before seeing your caregiver, make a list of your menstrual periods (when the occur, how heavy they are, how long between periods and how long they last), your symptoms and when they started.  Take the medication as recommended by your caregiver.  Sleep and rest.  Exercise.  Eat a diet that contains calcium (good for your bones) and soy (acts like estrogen hormone).  Do not smoke.  Avoid alcoholic beverages.  Taking vitamin E may help in certain cases.  Take calcium and vitamin D supplements to help prevent bone loss.  Group therapy is sometimes helpful.  Acupuncture may help in some cases. SEEK MEDICAL CARE IF:   You have any of the above and want to know if it is perimenopause.  You want advice and treatment for any of your symptoms mentioned above.  You need a referral to a specialist (gynecologist, psychiatrist or psychologist). SEEK IMMEDIATE MEDICAL CARE IF:   You have vaginal bleeding.  Your period lasts longer than 8 days.  You periods are recurring sooner than 21 days.  You have bleeding after intercourse.  You have severe depression.  You have pain when you urinate.  You have severe headaches.  You develop vision problems. Document Released: 03/06/2004 Document Revised: 04/21/2011 Document Reviewed: 11/25/2007 [facility] Patient Information 2013 [place], [place].  Hormone Therapy At menopause, your body begins making less estrogen and progesterone hormones. This causes the body to stop having menstrual periods. This is because estrogen and progesterone hormones control your periods and menstrual cycle.  A lack of estrogen may cause symptoms such as:  Hot flushes (or hot flashes).  Vaginal dryness.  Dry skin.  Loss of sex  drive.  Risk of bone loss (osteoporosis). When this happens, you may choose to take hormone therapy to get back the estrogen lost during menopause. When the hormone estrogen is given alone, it is usually referred to as ET (Estrogen Therapy). When the hormone progestin is combined with estrogen, it is generally called HT (Hormone Therapy). This was formerly known as hormone replacement therapy (HRT). Your caregiver can help you make a decision on what will be best for you. The decision to use HT seems to change often as new studies are done. Many studies do not agree on the benefits of hormone replacement therapy. LIKELY BENEFITS OF HT INCLUDE PROTECTION FROM:  Hot Flushes (also called hot flashes) - A hot flush is a sudden feeling of heat that spreads over the face and body. The skin may redden like a blush. It is connected with sweats and sleep disturbance. Women going through menopause may have hot flushes a few times a month or several times per day depending on the woman.  Osteoporosis (bone loss)- Estrogen helps guard against bone loss. After menopause, a woman's bones slowly lose calcium and become weak and brittle. As a result, bones are more likely to break. The hip, wrist, and spine are affected most often. Hormone therapy can help slow bone loss after menopause. Weight bearing exercise and taking calcium with vitamin D also can help prevent bone loss. There are also medications that your caregiver can prescribe that can help prevent osteoporosis.  Vaginal Dryness - Loss of estrogen causes changes in the vagina. Its lining may become thin and dry. These changes can cause pain and bleeding during sexual intercourse. Dryness can also lead to infections. This can cause burning and itching. (Vaginal estrogen treatment can help relieve pain, itching, and dryness.)  Urinary Tract Infections are more common after menopause because of lack of estrogen. Some women also develop urinary incontinence  because of low estrogen levels in the vagina and bladder.  Possible other benefits of estrogen include a positive effect on mood and short-term memory in women. RISKS AND COMPLICATIONS  Using estrogen alone without progesterone causes the lining of the uterus to grow. This increases the risk of lining of the uterus (endometrial) cancer. Your caregiver should give another hormone called progestin if you have a uterus.  Women who take combined (estrogen and progestin) HT appear to have an increased risk of breast cancer. The risk appears to be small, but increases throughout the time that HT is taken.  Combined therapy also makes the breast tissue slightly denser which makes it harder to read mammograms (breast X-rays).  Combined, estrogen and progesterone therapy can be taken together every day, in which case there may be spotting of blood. HT therapy can be taken cyclically in which case you will have menstrual periods. Cyclically means HT is taken for a set amount of days, then not taken, then this process is repeated.  HT may increase the risk of stroke, heart attack, breast cancer and forming blood clots in your leg.  Transdermal estrogen (estrogen that is absorbed through the skin with a patch or a cream) may have more positive results with:  Cholesterol.  Blood pressure.  Blood clots. Having the following conditions may indicate you should not have HT:  Endometrial cancer.  Liver disease.  Breast cancer.  Heart disease.  History of  blood clots.  Stroke. TREATMENT   If you choose to take HT and have a uterus, usually estrogen and progestin are prescribed.  Your caregiver will help you decide the best way to take the medications.  Possible ways to take estrogen include:  Pills.  Patches.  Gels.  Sprays.  Vaginal estrogen cream, rings and tablets.  It is best to take the lowest dose possible that will help your symptoms and take them for the shortest period of  time that you can.  Hormone therapy can help relieve some of the problems (symptoms) that affect women at menopause. Before making a decision about HT, talk to your caregiver about what is best for you. Be well informed and comfortable with your decisions. HOME CARE INSTRUCTIONS   Follow your caregivers advice when taking the medications.  A Pap test is done to screen for cervical cancer.  The first Pap test should be done at age 30.  Between ages 8 and 54, Pap tests are repeated every 2 years.  Beginning at age 23, you are advised to have a Pap test every 3 years as long as your past 3 Pap tests have been normal.  Some women have medical problems that increase the chance of getting cervical cancer. Talk to your caregiver about these problems. It is especially important to talk to your caregiver if a new problem develops soon after your last Pap test. In these cases, your caregiver may recommend more frequent screening and Pap tests.  The above recommendations are the same for women who have or have not gotten the vaccine for HPV (Human Papillomavirus).  If you had a hysterectomy for a problem that was not a cancer or a condition that could lead to cancer, then you no longer need Pap tests. However, even if you no longer need a Pap test, a regular exam is a good idea to make sure no other problems are starting.   If you are between ages 100 and 62, and you have had normal Pap tests going back 10 years, you no longer need Pap tests. However, even if you no longer need a Pap test, a regular exam is a good idea to make sure no other problems are starting.   If you have had past treatment for cervical cancer or a condition that could lead to cancer, you need Pap tests and screening for cancer for at least 20 years after your treatment.  If Pap tests have been discontinued, risk factors (such as a new sexual partner) need to be re-assessed to determine if screening should be  resumed.  Some women may need screenings more often if they are at high risk for cervical cancer.  Get mammograms done as per the advice of your caregiver. SEEK IMMEDIATE MEDICAL CARE IF:  You develop abnormal vaginal bleeding.  You have pain or swelling in your legs, shortness of breath, or chest pain.  You develop dizziness or headaches.  You have lumps or changes in your breasts or armpits.  You have slurred speech.  You develop weakness or numbness of your arms or legs.  You have pain, burning, or bleeding when urinating.  You develop abdominal pain. Document Released: 10/26/2002 Document Revised: 04/21/2011 Document Reviewed: 02/13/2010 [facility] Patient Information 2013 [place], [place].

## 2012-06-10 NOTE — Progress Notes (Signed)
Allison Frazier February 08, 1961 [id number]   History:    52 y.o.  for annual gyn exam with the only complaint is of her having cycles every other month and when she does have a cycle is very heavy for the first few days. She denies in any intermenstrual spotting. Patient has previous tubal sterilization procedure. Patient with past history of borderline hypertension and hyperlipidemia. There was an attempt last year to place her on Zocor but she had to discontinue it because of leg cramping. Patient has had issues with her weight. In 2009 patient had endometrial ablation. She has no vasomotor symptoms reported. She had a colonoscopy this year which was normal she also had a normal mammogram this year which was normal. Patient would know prior history of abnormal Pap smears. Her Tdap vaccine is up-to-date.  Past medical history,surgical history, family history and social history were all reviewed and documented in the EPIC chart.  Gynecologic History Patient's last menstrual period was 06/03/2012. Contraception: tubal ligation Last Pap: 2011. Results were: normal Last mammogram: 2014. Results were: normal  Obstetric History OB History   Grav Para Term Preterm Abortions TAB SAB Ect Mult Living   3 3 3       3      # Outc Date GA Lbr Len/2nd Wgt Sex Del Anes PTL Lv   1 TRM     F SVD  No Yes   2 TRM     M CS  No Yes   3 TRM     F CS  No Yes       ROS: A ROS was performed and pertinent positives and negatives are included in the history.  GENERAL: No fevers or chills. HEENT: No change in vision, no earache, sore throat or sinus congestion. NECK: No pain or stiffness. CARDIOVASCULAR: No chest pain or pressure. No palpitations. PULMONARY: No shortness of breath, cough or wheeze. GASTROINTESTINAL: No abdominal pain, nausea, vomiting or diarrhea, melena or bright red blood per rectum. GENITOURINARY: No urinary frequency, urgency, hesitancy or dysuria. MUSCULOSKELETAL: No joint or muscle pain, no back  pain, no recent trauma. DERMATOLOGIC: No rash, no itching, no lesions. ENDOCRINE: No polyuria, polydipsia, no heat or cold intolerance. No recent change in weight. HEMATOLOGICAL: No anemia or easy bruising or bleeding. NEUROLOGIC: No headache, seizures, numbness, tingling or weakness. PSYCHIATRIC: No depression, no loss of interest in normal activity or change in sleep pattern.     Exam: chaperone present  BP 140/90  Ht 5\' 5"  (1.651 m)  Wt 243 lb (110.224 kg)  BMI 40.44 kg/m2  LMP 06/03/2012  Body mass index is 40.44 kg/(m^2).  General appearance : Well developed well nourished female. No acute distress HEENT: Neck supple, trachea midline, no carotid bruits, no thyroidmegaly Lungs: Clear to auscultation, no rhonchi or wheezes, or rib retractions  Heart: Regular rate and rhythm, no murmurs or gallops Breast:Examined in sitting and supine position were symmetrical in appearance, no palpable masses or tenderness,  no skin retraction, no nipple inversion, no nipple discharge, no skin discoloration, no axillary or supraclavicular lymphadenopathy Abdomen: no palpable masses or tenderness, no rebound or guarding Extremities: no edema or skin discoloration or tenderness  Pelvic:  Bartholin, Urethra, Skene Glands: Within normal limits             Vagina: No gross lesions or discharge  Cervix: No gross lesions or discharge  Uterus  10 week size slightly irregular, normal size, shape and consistency, non-tender and mobile  Adnexa  Without  masses or tenderness  Anus and perineum  normal   Rectovaginal  normal sphincter tone without palpated masses or tenderness             Hemoccult colonoscopy this year negative     Assessment/Plan:  52 y.o. female for annual exam with apparent perimenopausal like symptoms. We are going to check her FSH level today. We'll also check her fasting lipid profile along with her comprehensive metabolic panel CBC and urinalysis, TSH and Pap smear. She will return  back to the office in one to 2 weeks for an ultrasound for followup of her fibroid uterus and discuss the above results. I have stressed upon her the importance of being followed by an internist and have given her name of some of my colleagues  in the community who are part of the [place] network so they would be able to look at her labs that are being drawn today. I have given the patient literature information on perimenopause as well as on hormone replacement therapy.    Ok Edwards MD, 10:47 AM 06/10/2012

## 2012-06-11 ENCOUNTER — Other Ambulatory Visit: Payer: Self-pay | Admitting: Gynecology

## 2012-06-11 ENCOUNTER — Telehealth: Payer: Self-pay

## 2012-06-11 LAB — URINALYSIS W MICROSCOPIC + REFLEX CULTURE
Bilirubin Urine: NEGATIVE
Crystals: NONE SEEN
Glucose, UA: NEGATIVE mg/dL
Ketones, ur: NEGATIVE mg/dL
Leukocytes, UA: NEGATIVE
Nitrite: NEGATIVE
Protein, ur: NEGATIVE mg/dL
Specific Gravity, Urine: 1.023 (ref 1.005–1.030)
Urobilinogen, UA: 0.2 mg/dL (ref 0.0–1.0)
pH: 5.5 (ref 5.0–8.0)

## 2012-06-11 LAB — FOLLICLE STIMULATING HORMONE: FSH: 15.9 m[IU]/mL

## 2012-06-11 MED ORDER — IBUPROFEN 800 MG PO TABS: 800.0000 mg | ORAL_TABLET | Freq: Three times a day (TID) | ORAL | Status: DC | PRN

## 2012-06-11 MED ORDER — MEDROXYPROGESTERONE ACETATE 10 MG PO TABS: ORAL_TABLET | ORAL | Status: DC

## 2012-06-11 NOTE — Telephone Encounter (Signed)
Patient was just in for yearly.  I spoke with her about labs today. She asked me at the time if you would prescribe her Ibuprofen 800 with refills for the year. OK?

## 2012-06-11 NOTE — Telephone Encounter (Signed)
Motrin 800 mg tid prn #30 refill 3

## 2012-06-11 NOTE — Telephone Encounter (Signed)
rx escribed to pharmacy 

## 2012-06-14 LAB — URINE CULTURE: Colony Count: 8000

## 2012-06-23 ENCOUNTER — Ambulatory Visit (INDEPENDENT_AMBULATORY_CARE_PROVIDER_SITE_OTHER): Payer: 59 | Admitting: Gynecology

## 2012-06-23 ENCOUNTER — Other Ambulatory Visit: Payer: Self-pay | Admitting: Gynecology

## 2012-06-23 ENCOUNTER — Encounter: Payer: Self-pay | Admitting: Gynecology

## 2012-06-23 ENCOUNTER — Ambulatory Visit (INDEPENDENT_AMBULATORY_CARE_PROVIDER_SITE_OTHER): Payer: 59

## 2012-06-23 DIAGNOSIS — D251 Intramural leiomyoma of uterus: Secondary | ICD-10-CM

## 2012-06-23 DIAGNOSIS — D259 Leiomyoma of uterus, unspecified: Secondary | ICD-10-CM

## 2012-06-23 DIAGNOSIS — N920 Excessive and frequent menstruation with regular cycle: Secondary | ICD-10-CM

## 2012-06-23 DIAGNOSIS — N852 Hypertrophy of uterus: Secondary | ICD-10-CM

## 2012-06-23 DIAGNOSIS — N831 Corpus luteum cyst of ovary, unspecified side: Secondary | ICD-10-CM

## 2012-06-23 DIAGNOSIS — N915 Oligomenorrhea, unspecified: Secondary | ICD-10-CM

## 2012-06-23 DIAGNOSIS — E785 Hyperlipidemia, unspecified: Secondary | ICD-10-CM

## 2012-06-23 DIAGNOSIS — E663 Overweight: Secondary | ICD-10-CM

## 2012-06-23 NOTE — Progress Notes (Signed)
Patient was seen in the office on May 1 for her annual exam. Patient has history of oligomenorrhea and was prescribed Provera to take 10 mg every 30 days if she did not have a spontaneous menses. Patient has previous tubal sterilization procedure. Patient with past history of borderline hypertension and hyperlipidemia. There was an attempt last year to place her on Zocor but she had to discontinue it because of leg cramping. Patient has had issues with her weight. In 2009 patient had endometrial ablation. She has no vasomotor symptoms reported. This patient had a history of leiomyomatous uteri she was asked to have an ultrasound come to the office today as well to discuss her lab results from her annual exam.   Ultrasound report as follows: Uterus measured 14 x 4 x 8.0 x 4.3 cm with endometrial stripe of 13 mm (patient getting ready to start her menses) patient with several subserosal intramural myomas the largest one measuring 44 x 32 mm. Right ovary with 2 hypoechoic cyst with internal low level echoes measured 17 x 40 mm and 17 x 17 mm. Left ovary was normal.  Labs from May 1 of this year: Normal comprehensive metabolic panel, CBC, TSH,blood sugar, FSH and Pap smear. Her lipid profile as compared to last year as follows:  Results for KENYATTE, GRUBER (MRN [id number]) as of 06/23/2012 15:06  Ref. Range 04/30/2011 10:27 06/10/2012 10:59  Cholesterol Latest Range: 0-200 mg/dL 086 (H) 578 (H)  Triglycerides Latest Range: <150 mg/dL 469 (H) 84  HDL Latest Range: >39 mg/dL 52 58  LDL (calc) Latest Range: 0-99 mg/dL 629 (H) 528 (H)  VLDL Latest Range: 0-40 mg/dL 31 17  Total CHOL/HDL Ratio No range found 4.6 3.5    Assessment/plan: Patient with episodes of oligomenorrhea every 2 months probably attributed to her weight. Patient will continue to take Provera 10 mg for 10 days of each month if she does not have a spontaneous menses every 30 days. Once again we address her weight issues importance of exercise  and regular diet. Patient's follow has history of cardiovascular disease. We discussed importance of starting her on a statin. Review her last lipid profile indicated that her LDL is still elevated at 129 although it was 155 last year. We are going to try different statin and hopefully she will not have the muscle aches as she did before. She had normal liver function tests recently. She will be started on Lipitor 10 mg by mouth daily. We will followup in one year with an ultrasound on her fibroid uterus. Literature information on diet and exercise was provided before.

## 2012-06-29 ENCOUNTER — Other Ambulatory Visit: Payer: Self-pay | Admitting: *Deleted

## 2012-06-29 MED ORDER — ATORVASTATIN CALCIUM 10 MG PO TABS: 10.0000 mg | ORAL_TABLET | Freq: Every day | ORAL | Status: DC

## 2012-06-29 NOTE — Telephone Encounter (Signed)
Pt states Dr Glenetta Hew didn't send at her OV. Per OV to start on Lipitor 10mg  Rx sent KW

## 2012-07-10 ENCOUNTER — Emergency Department (HOSPITAL_COMMUNITY): Payer: 59

## 2012-07-10 ENCOUNTER — Observation Stay (HOSPITAL_COMMUNITY)
Admission: EM | Admit: 2012-07-10 | Discharge: 2012-07-11 | Disposition: A | Discharge status: Home / Self Care | Payer: 59 | Attending: Internal Medicine | Admitting: Internal Medicine

## 2012-07-10 ENCOUNTER — Encounter (HOSPITAL_COMMUNITY): Payer: Self-pay | Admitting: *Deleted

## 2012-07-10 DIAGNOSIS — Z6841 Body Mass Index (BMI) 40.0 and over, adult: Secondary | ICD-10-CM | POA: Insufficient documentation

## 2012-07-10 DIAGNOSIS — E7800 Pure hypercholesterolemia, unspecified: Secondary | ICD-10-CM | POA: Insufficient documentation

## 2012-07-10 DIAGNOSIS — R079 Chest pain, unspecified: Principal | ICD-10-CM | POA: Insufficient documentation

## 2012-07-10 DIAGNOSIS — R0602 Shortness of breath: Secondary | ICD-10-CM | POA: Insufficient documentation

## 2012-07-10 DIAGNOSIS — E785 Hyperlipidemia, unspecified: Secondary | ICD-10-CM | POA: Insufficient documentation

## 2012-07-10 DIAGNOSIS — R110 Nausea: Secondary | ICD-10-CM | POA: Insufficient documentation

## 2012-07-10 LAB — HEPATIC FUNCTION PANEL
ALT: 17 U/L (ref 0–35)
Alkaline Phosphatase: 73 U/L (ref 39–117)
Bilirubin, Direct: 0.1 mg/dL (ref 0.0–0.3)
Indirect Bilirubin: 0.2 mg/dL — ABNORMAL LOW (ref 0.3–0.9)
Total Bilirubin: 0.3 mg/dL (ref 0.3–1.2)

## 2012-07-10 LAB — BASIC METABOLIC PANEL
CO2: 26 mEq/L (ref 19–32)
Calcium: 10.1 mg/dL (ref 8.4–10.5)
Creatinine, Ser: 0.77 mg/dL (ref 0.50–1.10)
Glucose, Bld: 104 mg/dL — ABNORMAL HIGH (ref 70–99)

## 2012-07-10 LAB — CBC
Hemoglobin: 13.5 g/dL (ref 12.0–15.0)
MCH: 28.1 pg (ref 26.0–34.0)
MCHC: 33.1 g/dL (ref 30.0–36.0)
MCV: 84.8 fL (ref 78.0–100.0)
RBC: 4.81 MIL/uL (ref 3.87–5.11)

## 2012-07-10 MED ORDER — NITROGLYCERIN 0.4 MG SL SUBL: 0.4000 mg | SUBLINGUAL_TABLET | Freq: Once | SUBLINGUAL | Status: DC

## 2012-07-10 MED ORDER — PANTOPRAZOLE SODIUM 40 MG IV SOLR
40.0000 mg | Freq: Once | INTRAVENOUS | Status: AC
Start: 2012-07-10 — End: 2012-07-10
  Administered 2012-07-10: 40 mg via INTRAVENOUS
  Filled 2012-07-10: qty 40

## 2012-07-10 MED ORDER — NITROGLYCERIN 0.4 MG SL SUBL
0.4000 mg | SUBLINGUAL_TABLET | Freq: Once | SUBLINGUAL | Status: AC
  Administered 2012-07-10: 0.4 mg via SUBLINGUAL
  Filled 2012-07-10: qty 25

## 2012-07-10 MED ORDER — ONDANSETRON HCL 4 MG/2ML IJ SOLN: 4.0000 mg | Freq: Three times a day (TID) | INTRAMUSCULAR | Status: DC | PRN

## 2012-07-10 NOTE — ED Notes (Signed)
Asa 81 mg given viw ems and zofran 4 mg ivp

## 2012-07-10 NOTE — H&P (Signed)
[facility] History and Physical  Allison Frazier  ZOX:[id number]  DOB: [birth year]   DOA: 07/10/2012   PCP:   Colette Ribas, MD   Chief Complaint:  Chest pain for about half an hour this afternoon  HPI: Allison Frazier is an 52 y.o. female.   Morbidly obese Caucasian lady with mild hyperlipidemia not yet started on the statin, and no past history of coronary artery disease, presents with sudden onset of burning epigastric pain, change in throat tightness and a pressing associated with shortness of breath some nausea, and rates it an 8/10. She felt it might be indigestion but did not take an antacid instead took 2 aspirin, and some expired nitroglycerin which had no effect. EMS was called and she received a total of 2 doses of sublingual nitroglycerin which eventually resolved the pain completely. She arrived to the emergency room within an hour and was pain free.  EKG and cardiac enzymes unremarkable and the hospitalist service was called for admission.  Patient does not exercise and is not on any particular dietary regimen for her obesity  For the past 2 years, She takes ibuprofen monthly for menstrual cramp; last menstrual period May 21 She has been prescribed progesterone tablets by her gynecologist but has not yet started Rewiew of Systems:   All systems negative except as marked bold or noted in the HPI;  Constitutional:    malaise, fever and chills. ;  Eyes:   eye pain, redness and discharge. ;  ENMT:   ear pain, hoarseness, nasal congestion, sinus pressure and sore throat. ;  Respiratory:   cough, hemoptysis, wheezing and stridor. ;  Gastrointestinal:  nausea, vomiting, diarrhea, constipation, abdominal pain, melena, blood in stool, hematemesis, jaundice and rectal bleeding. unusual weight loss..   Genitourinary:    frequency, dysuria, incontinence,flank pain and hematuria; Musculoskeletal:   back pain and neck pain.  swelling and trauma.;  Skin: .  pruritus, rash,  abrasions, bruising and skin lesion.; ulcerations Neuro:    headache, lightheadedness and neck stiffness.  weakness, altered level of consciousness, altered mental status, extremity weakness, burning feet, involuntary movement, seizure and syncope.  Psych:    anxiety, depression, insomnia, tearfulness, panic attacks, hallucinations, paranoia, suicidal or homicidal ideation   Past Medical History  Diagnosis Date  . PMDD (premenstrual dysphoric disorder)   . High cholesterol   . Chondromalacia of both patellae     Past Surgical History  Procedure Laterality Date  . Cesarean section  1996  . Cesarean section  1998  . Tubal ligation  1998  . Lasix      REFRACTIVE SURGERY-  RIGHT EYE  . Endometrial ablation  04/04/07    HER OPTION    Medications:  HOME MEDS: Prior to Admission medications   Medication Sig Start Date End Date Taking? Authorizing Provider  aspirin EC 81 MG tablet Take 162-324 mg by mouth 2 (two) times daily. Two tablets chewed on initial dose, then advised to take 4 additional tablets prior to EMS arrival   Yes Historical Provider, MD  cetirizine (ZYRTEC) 10 MG tablet Take 10 mg by mouth daily.   Yes Historical Provider, MD  ibuprofen (ADVIL,MOTRIN) 800 MG tablet Take 1 tablet (800 mg total) by mouth every 8 (eight) hours as needed for pain. 06/11/12  Yes Ok Edwards, MD  Oxymetazoline HCl [facility] LONG-ACTING NA) Place 1 spray into the nose daily as needed (for allergy symptoms).   Yes Historical Provider, MD  atorvastatin (LIPITOR) 10 MG tablet  Take 1 tablet (10 mg total) by mouth daily. 06/29/12   Ok Edwards, MD  medroxyPROGESTERone (PROVERA) 10 MG tablet Take one tablet by mouth daily for ten days. 06/11/12   Ok Edwards, MD     Allergies:  Allergies  Allergen Reactions  . Simvastatin Other (See Comments)    BODY ACHES    Social History:   reports that she has never smoked. She has never used smokeless tobacco. She reports that  drinks alcohol. She  reports that she does not use illicit drugs.  Family History: Family History  Problem Relation Age of Onset  . Heart disease Father      Physical Exam: Filed Vitals:   07/10/12 1916 07/10/12 1930  BP: 130/74 114/63  Pulse: 98 86  Temp: 99.2 F (37.3 C)   TempSrc: Oral   Resp: 16   Height: 5\' 5"  (1.651 m)   Weight: 109.77 kg (242 lb)   SpO2: 97% 100%   Blood pressure 114/63, pulse 86, temperature 99.2 F (37.3 C), temperature source Oral, resp. rate 16, height 5\' 5"  (1.651 m), weight 109.77 kg (242 lb), last menstrual period 06/03/2012, SpO2 100.00%.  GEN:  Pleasant obese middle-aged Caucasian lady sitting up bed in no acute distress;  cooperative with exam PSYCH:  alert and oriented x4;  neither anxious nor depressed; affect is appropriate. HEENT: Mucous membranes pink and anicteric; PERRLA; EOM intact; no cervical lymphadenopathy nor thyromegaly or carotid bruit; no JVD; Breasts:: Not examined CHEST WALL: No tenderness CHEST: Normal respiration, clear to auscultation bilaterally HEART: Regular rate and rhythm; no murmurs rubs or gallops BACK: No kyphosis no scoliosis; no CVA tenderness ABDOMEN: Obese, soft non-tender; no masses, no organomegaly, normal abdominal bowel sounds; no intertriginous candida. Rectal Exam: Not done EXTREMITIES:  age-appropriate arthropathy of the hands and knees; no edema; no ulcerations. Genitalia: not examined PULSES: 2+ and symmetric SKIN: Normal hydration no rash or ulceration CNS: Cranial nerves 2-12 grossly intact no focal lateralizing neurologic deficit   Labs on Admission:  Basic Metabolic Panel:  Recent Labs Lab 07/10/12 1930  NA 139  K 4.0  CL 102  CO2 26  GLUCOSE 104*  BUN 22  CREATININE 0.77  CALCIUM 10.1   Liver Function Tests:  Recent Labs Lab 07/10/12 1930  AST 14  ALT 17  ALKPHOS 73  BILITOT 0.3  PROT 6.7  ALBUMIN 3.9    Recent Labs Lab 07/10/12 1930  LIPASE 29   No results found for this basename:  AMMONIA,  in the last 168 hours CBC:  Recent Labs Lab 07/10/12 1930  WBC 8.1  HGB 13.5  HCT 40.8  MCV 84.8  PLT 301   Cardiac Enzymes:  Recent Labs Lab 07/10/12 1930  TROPONINI <0.30   BNP: No components found with this basename: POCBNP,  D-dimer: No components found with this basename: D-DIMER,  CBG: No results found for this basename: GLUCAP,  in the last 168 hours  Radiological Exams on Admission: Dg Chest Port 1 View  07/10/2012   *RADIOLOGY REPORT*  Clinical Data: Chest pain and shortness of breath.  PORTABLE CHEST - 1 VIEW  Comparison: CT chest to 12/13/2002.  Findings: Heart size is exaggerated by low lung volumes.  The visualized soft tissues and bony thorax are unremarkable.  IMPRESSION:  1.  Low lung volumes. 2.  No acute cardiopulmonary disease.   Original Report Authenticated By: Marin Roberts, M.D.    EKG: Independently reviewed. Normal sinus rhythm   Assessment/Plan  Principal Problem:  Chest pain Active Problems:   High cholesterol   Morbid obesity   PLAN: Bring this lady in on observation to rule out acute coronary syndrome; will give empiric treatment for GERD Consult importance of weight loss; advise outpatient cardiac stress test if lab work and EKG remained normal throughout his hospital course. Start Lipitor as prescribed by her primary care physician  Other plans as per orders.  Code Status: Full Family Communication: Rounds discussed with patient husband and family at bedside Disposition Plan: Likely home tomorrow    Mckinnley Smithey Nocturnist [facility] Pager [phone]   07/10/2012, 9:14 PM

## 2012-07-10 NOTE — ED Notes (Signed)
Took 6 bay asa and one nitro which was expired.

## 2012-07-10 NOTE — ED Provider Notes (Signed)
History  This chart was scribed for Dione Booze, MD by Bennett Scrape, ED Scribe. This patient was seen in room APA04/APA04 and the patient's care was started at 7:28 PM.  CSN: [id number]  Arrival date & time 07/10/12  1905   First MD Initiated Contact with Patient 07/10/12 1928      Chief Complaint  Patient presents with  . Chest Pain    feels ilke indigestion     The history is provided by the patient. No language interpreter was used.    HPI Comments: Allison Frazier is a 52 y.o. female brought in by ambulance, who presents to the Emergency Department complaining of gradually improving, constant, non-radiating epigastric abdominal pain described as "sharp indigestion" that started while at rest that then became a tightness when she went to sit down with associated SOB. She rates her pain an 8 out of 10 at its worst and rates it a 0-1 out of 10 currently. She denies having any modifying factors. She states that she developed nausea when EMS arrived. EMS administered 81 mg ASA, 1 SL NTG and 4 mg Zofran en route and pt took 6 baby ASA and one expired nitro from her husband's prescription prior to EMS arrival with improvement. She denies having a h/o cardiac or pulmonary problems, HTN and DM. She reports that her father had a CABG in his early 48s but denies any other significant family history. She denies emesis, cough and diaphoresis as associated symptoms. She has a h/o HLD and is an occasional alcohol user but denies smoking.  PCP is Dr. Phillips Odor.  Past Medical History  Diagnosis Date  . PMDD (premenstrual dysphoric disorder)   . High cholesterol   . Chondromalacia of both patellae     Past Surgical History  Procedure Laterality Date  . Cesarean section  1996  . Cesarean section  1998  . Tubal ligation  1998  . Lasix      REFRACTIVE SURGERY-  RIGHT EYE  . Endometrial ablation  04/04/07    HER OPTION    Family History  Problem Relation Age of Onset  . Heart disease Father      History  Substance Use Topics  . Smoking status: Never Smoker   . Smokeless tobacco: Never Used  . Alcohol Use: Yes     Comment: social    OB History   Grav Para Term Preterm Abortions TAB SAB Ect Mult Living   3 3 3       3       Review of Systems  Respiratory: Positive for shortness of breath. Negative for cough.   Cardiovascular: Negative for chest pain.  Gastrointestinal: Positive for nausea and abdominal pain. Negative for vomiting.  Neurological: Negative for headaches.  All other systems reviewed and are negative.    Allergies  Simvastatin  Home Medications   Current Outpatient Rx  Name  Route  Sig  Dispense  Refill  . atorvastatin (LIPITOR) 10 MG tablet   Oral   Take 1 tablet (10 mg total) by mouth daily.   30 tablet   6   . calcium carbonate (OS-CAL) 600 MG TABS   Oral   Take 600 mg by mouth 2 (two) times daily with a meal.         . cetirizine (ZYRTEC) 10 MG tablet   Oral   Take 10 mg by mouth daily.         . cholecalciferol (VITAMIN D) 1000 UNITS tablet   Oral  Take 1,000 Units by mouth daily.         . fish oil-omega-3 fatty acids 1000 MG capsule   Oral   Take 2 g by mouth daily.         . Fluoxetine HCl, PMDD, (SARAFEM) 10 MG TABS   Oral   Take by mouth.         Marland Kitchen ibuprofen (ADVIL,MOTRIN) 800 MG tablet   Oral   Take 1 tablet (800 mg total) by mouth every 8 (eight) hours as needed for pain.   30 tablet   3     Pt needs to schedule her annual exam   . medroxyPROGESTERone (PROVERA) 10 MG tablet      Take one tablet by mouth daily for ten days.   30 tablet   3     Triage Vitals: BP 130/74  Pulse 98  Temp(Src) 99.2 F (37.3 C) (Oral)  Resp 16  Ht 5\' 5"  (1.651 m)  Wt 242 lb (109.77 kg)  BMI 40.27 kg/m2  SpO2 97%  LMP 06/03/2012  Physical Exam  Nursing note and vitals reviewed. Constitutional: She is oriented to person, place, and time. She appears well-developed and well-nourished. No distress.  HENT:   Head: Normocephalic and atraumatic.  Eyes: Conjunctivae and EOM are normal.  Neck: Neck supple. No tracheal deviation present.  Cardiovascular: Normal rate and regular rhythm.   No murmur heard. Pulmonary/Chest: Effort normal and breath sounds normal. No respiratory distress.  Abdominal: Soft. There is tenderness (mild epigstric tenderness). There is no rebound and no guarding.  Musculoskeletal: Normal range of motion. She exhibits no edema.  Neurological: She is alert and oriented to person, place, and time.  Skin: Skin is warm and dry.  Psychiatric: She has a normal mood and affect. Her behavior is normal.    ED Course  Procedures (including critical care time)  Medications  nitroGLYCERIN (NITROSTAT) SL tablet 0.4 mg (not administered)    DIAGNOSTIC STUDIES: Oxygen Saturation is 97% on room air, normal by my interpretation.    COORDINATION OF CARE: 7:41 PM-Informed pt that her EKG is normal and advised pt that her symptoms are not likely to be cardiac related. Discussed treatment plan which includes CXR, CBC panel, CMP, UA with pt at bedside and pt agreed to plan.   Results for orders placed during the hospital encounter of 07/10/12  CBC      Result Value Range   WBC 8.1  4.0 - 10.5 K/uL   RBC 4.81  3.87 - 5.11 MIL/uL   Hemoglobin 13.5  12.0 - 15.0 g/dL   HCT 16.1  09.6 - 04.5 %   MCV 84.8  78.0 - 100.0 fL   MCH 28.1  26.0 - 34.0 pg   MCHC 33.1  30.0 - 36.0 g/dL   RDW 40.9  81.1 - 91.4 %   Platelets 301  150 - 400 K/uL  BASIC METABOLIC PANEL      Result Value Range   Sodium 139  135 - 145 mEq/L   Potassium 4.0  3.5 - 5.1 mEq/L   Chloride 102  96 - 112 mEq/L   CO2 26  19 - 32 mEq/L   Glucose, Bld 104 (*) 70 - 99 mg/dL   BUN 22  6 - 23 mg/dL   Creatinine, Ser 7.82  0.50 - 1.10 mg/dL   Calcium 95.6  8.4 - 21.3 mg/dL   GFR calc non Af Amer >90  >90 mL/min   GFR calc Af Amer >  90  >90 mL/min  TROPONIN I      Result Value Range   Troponin I <0.30  <0.30 ng/mL   HEPATIC FUNCTION PANEL      Result Value Range   Total Protein 6.7  6.0 - 8.3 g/dL   Albumin 3.9  3.5 - 5.2 g/dL   AST 14  0 - 37 U/L   ALT 17  0 - 35 U/L   Alkaline Phosphatase 73  39 - 117 U/L   Total Bilirubin 0.3  0.3 - 1.2 mg/dL   Bilirubin, Direct 0.1  0.0 - 0.3 mg/dL   Indirect Bilirubin 0.2 (*) 0.3 - 0.9 mg/dL  LIPASE, BLOOD      Result Value Range   Lipase 29  11 - 59 U/L   Dg Chest Port 1 View  07/10/2012   *RADIOLOGY REPORT*  Clinical Data: Chest pain and shortness of breath.  PORTABLE CHEST - 1 VIEW  Comparison: CT chest to 12/13/2002.  Findings: Heart size is exaggerated by low lung volumes.  The visualized soft tissues and bony thorax are unremarkable.  IMPRESSION:  1.  Low lung volumes. 2.  No acute cardiopulmonary disease.   Original Report Authenticated By: Marin Roberts, M.D.    ECG shows normal sinus rhythm with a rate of 95, no ectopy. Normal axis. Normal P wave. Normal QRS. Normal intervals. Normal ST and T waves. Impression: normal ECG. Compared with ECG of 05/21/2006, and no significant changes are seen.   1. Chest pain       MDM  Chest pain/epigastric pain. Description of pain is worrisome for cardiac disease but I suspect that this is GI in origin. Almost complete relief with single nitroglycerin is worrisome and she will require observation for serial cardiac markers.  After second nitroglycerin, she is a completely for pain. She is also given a dose of pantoprazole. Initial workup is negative then she will be admitted for serial cardiac markers. Case is discussed with Dr. Orvan Falconer of [facility] who agrees to admit the patient under observation status.  I personally performed the services described in this documentation, which was scribed in my presence. The recorded information has been reviewed and is accurate.          Dione Booze, MD 07/10/12 [id number]

## 2012-07-11 ENCOUNTER — Encounter (HOSPITAL_COMMUNITY): Payer: Self-pay | Admitting: Internal Medicine

## 2012-07-11 DIAGNOSIS — R079 Chest pain, unspecified: Secondary | ICD-10-CM | POA: Diagnosis present

## 2012-07-11 LAB — COMPREHENSIVE METABOLIC PANEL
AST: 14 U/L (ref 0–37)
Albumin: 3.5 g/dL (ref 3.5–5.2)
Alkaline Phosphatase: 67 U/L (ref 39–117)
BUN: 20 mg/dL (ref 6–23)
Chloride: 103 mEq/L (ref 96–112)
Potassium: 4 mEq/L (ref 3.5–5.1)
Sodium: 137 mEq/L (ref 135–145)
Total Bilirubin: 0.5 mg/dL (ref 0.3–1.2)
Total Protein: 6.5 g/dL (ref 6.0–8.3)

## 2012-07-11 LAB — HEMOGLOBIN A1C
Hgb A1c MFr Bld: 5.2 % (ref <5.7)
Mean Plasma Glucose: 103 mg/dL (ref <117)

## 2012-07-11 LAB — TROPONIN I
Troponin I: <0.3 ng/mL (ref <0.30)
Troponin I: <0.3 ng/mL (ref <0.30)

## 2012-07-11 LAB — MAGNESIUM: Magnesium: 2 mg/dL (ref 1.5–2.5)

## 2012-07-11 LAB — CBC
MCHC: 33 g/dL (ref 30.0–36.0)
Platelets: 296 10*3/uL (ref 150–400)
RDW: 14.5 % (ref 11.5–15.5)
WBC: 7.9 10*3/uL (ref 4.0–10.5)

## 2012-07-11 LAB — LIPID PANEL: Total CHOL/HDL Ratio: 3.9 RATIO

## 2012-07-11 LAB — TSH: TSH: 2.032 u[IU]/mL (ref 0.350–4.500)

## 2012-07-11 MED ORDER — PANTOPRAZOLE SODIUM 40 MG PO TBEC: 40.0000 mg | DELAYED_RELEASE_TABLET | Freq: Every day | ORAL | Status: DC

## 2012-07-11 MED ORDER — TRAZODONE HCL 50 MG PO TABS: 50.0000 mg | ORAL_TABLET | Freq: Every evening | ORAL | Status: DC | PRN

## 2012-07-11 MED ORDER — ASPIRIN EC 81 MG PO TBEC
81.0000 mg | DELAYED_RELEASE_TABLET | Freq: Every day | ORAL | Status: DC
  Administered 2012-07-11: 81 mg via ORAL
  Filled 2012-07-11: qty 1

## 2012-07-11 MED ORDER — PANTOPRAZOLE SODIUM 40 MG PO TBEC: 40.0000 mg | DELAYED_RELEASE_TABLET | Freq: Two times a day (BID) | ORAL | Status: DC

## 2012-07-11 MED ORDER — LORATADINE 10 MG PO TABS
10.0000 mg | ORAL_TABLET | Freq: Every day | ORAL | Status: DC
  Administered 2012-07-11: 10 mg via ORAL
  Filled 2012-07-11: qty 1

## 2012-07-11 MED ORDER — ENOXAPARIN SODIUM 40 MG/0.4ML ~~LOC~~ SOLN
40.0000 mg | SUBCUTANEOUS | Status: DC
  Administered 2012-07-11: 40 mg via SUBCUTANEOUS
  Filled 2012-07-11: qty 0.4

## 2012-07-11 MED ORDER — PANTOPRAZOLE SODIUM 40 MG PO TBEC
40.0000 mg | DELAYED_RELEASE_TABLET | Freq: Two times a day (BID) | ORAL | Status: DC
  Administered 2012-07-11: 40 mg via ORAL
  Filled 2012-07-11: qty 1

## 2012-07-11 MED ORDER — ATORVASTATIN CALCIUM 10 MG PO TABS: 10.0000 mg | ORAL_TABLET | Freq: Every day | ORAL | Status: DC

## 2012-07-11 MED ORDER — PANTOPRAZOLE SODIUM 40 MG IV SOLR: 40.0000 mg | Freq: Once | INTRAVENOUS | Status: DC

## 2012-07-11 MED ORDER — ACETAMINOPHEN 325 MG PO TABS: 650.0000 mg | ORAL_TABLET | ORAL | Status: DC | PRN

## 2012-07-11 MED ORDER — ONDANSETRON HCL 4 MG/2ML IJ SOLN: 4.0000 mg | INTRAMUSCULAR | Status: DC | PRN

## 2012-07-11 NOTE — Discharge Summary (Signed)
Physician Discharge Summary  Allison Frazier ZOX:[id number] DOB: 15-May-1960 DOA: 07/10/2012  PCP: Colette Ribas, MD  Admit date: 07/10/2012 Discharge date: 07/11/2012  Time spent: 45 minutes  Recommendations for Outpatient Follow-up:  1. Patient will followup with [place] [facility] [facility] for stress test. The patient works as a respiratory therapist in the stress lab.  Discharge Diagnoses:  Principal Problem:   Chest pain Active Problems:   High cholesterol   Morbid obesity   Discharge Condition: Improved  Diet recommendation: Low salt  Filed Weights   07/10/12 1916 07/10/12 2143  Weight: 109.77 kg (242 lb) 111.4 kg (245 lb 9.5 oz)    History of present illness:  Allison Frazier is an 52 y.o. female. Morbidly obese Caucasian lady with mild hyperlipidemia not yet started on the statin, and no past history of coronary artery disease, presents with sudden onset of burning epigastric pain, change in throat tightness and a pressing associated with shortness of breath some nausea, and rates it an 8/10. She felt it might be indigestion but did not take an antacid instead took 2 aspirin, and some expired nitroglycerin which had no effect. EMS was called and she received a total of 2 doses of sublingual nitroglycerin which eventually resolved the pain completely. She arrived to the emergency room within an hour and was pain free.  EKG and cardiac enzymes unremarkable and the hospitalist service was called for admission.  Patient does not exercise and is not on any particular dietary regimen for her obesity  For the past 2 years, She takes ibuprofen monthly for menstrual cramp; last menstrual period May 21  She has been prescribed progesterone tablets by her gynecologist but has not yet started   Hospital Course:  This lady was admitted to the hospital for chest pain. She described it as an indigestion that occurred while at rest. She had taken 2 aspirins without significant relief  of her symptoms. She took nitroglycerin which eventually resolved her symptoms. She was admitted to the hospital for observation. EKG did not show any acute changes and cardiac enzymes were found to be negative. Patient was started on Protonix for possible GERD. She does report improvement in her symptoms. Due to her risk factors, it was recommended that she undergo an outpatient stress test. 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 She reports that she will follow up over there to schedule an outpatient stress test. She is felt stable for discharge home today and has been advised to return to the emergency room if she has any recurrence of pain, shortness of breath or diaphoresis.  Procedures:  None   Consultations:  none  Discharge Exam: Filed Vitals:   07/10/12 2143 07/10/12 2208 07/11/12 0419 07/11/12 1300  BP: 135/81  112/58 109/60  Pulse: 83  70 78  Temp: 98 F (36.7 C)  98.2 F (36.8 C) 98.1 F (36.7 C)  TempSrc: Oral  Oral Oral  Resp: 16  16 16   Height: 5\' 5"  (1.651 m)     Weight: 111.4 kg (245 lb 9.5 oz)     SpO2: 95% 95% 99% 98%    General: No acute distress Cardiovascular: S1, S2, regular rate and rhythm Respiratory: Clear to auscultation bilaterally  Discharge Instructions  Discharge Orders   Future Orders Complete By Expires     Diet - low sodium heart healthy  As directed     Increase activity slowly  As directed  Medication List    STOP taking these medications       ibuprofen 800 MG tablet  Commonly known as:  ADVIL,MOTRIN      TAKE these medications       aspirin EC 81 MG tablet  Take 162-324 mg by mouth 2 (two) times daily. Two tablets chewed on initial dose, then advised to take 4 additional tablets prior to EMS arrival     atorvastatin 10 MG tablet  Commonly known as:  LIPITOR  Take 1 tablet (10 mg total) by mouth daily.     cetirizine 10 MG tablet  Commonly known as:  ZYRTEC  Take 10 mg by  mouth daily.     medroxyPROGESTERone 10 MG tablet  Commonly known as:  PROVERA  Take one tablet by mouth daily for ten days.     pantoprazole 40 MG tablet  Commonly known as:  PROTONIX  Take 1 tablet (40 mg total) by mouth daily.     SINEX LONG-ACTING NA  Place 1 spray into the nose daily as needed (for allergy symptoms).       Allergies  Allergen Reactions  . Simvastatin Other (See Comments)    BODY ACHES       Follow-up Information   Follow up with Colette Ribas, MD. (As needed)    Contact information:   [address] [place] [address] [phone]       Follow up with follow up with cardiology at [facility] for stress test asap.       The results of significant diagnostics from this hospitalization (including imaging, microbiology, ancillary and laboratory) are listed below for reference.    Significant Diagnostic Studies: Dg Chest Port 1 View  07/10/2012   *RADIOLOGY REPORT*  Clinical Data: Chest pain and shortness of breath.  PORTABLE CHEST - 1 VIEW  Comparison: CT chest to 12/13/2002.  Findings: Heart size is exaggerated by low lung volumes.  The visualized soft tissues and bony thorax are unremarkable.  IMPRESSION:  1.  Low lung volumes. 2.  No acute cardiopulmonary disease.   Original Report Authenticated By: Marin Roberts, M.D.    Microbiology: No results found for this or any previous visit (from the past 240 hour(s)).   Labs: Basic Metabolic Panel:  Recent Labs Lab 07/10/12 1930 07/11/12 0758  NA 139 137  K 4.0 4.0  CL 102 103  CO2 26 26  GLUCOSE 104* 113*  BUN 22 20  CREATININE 0.77 0.70  CALCIUM 10.1 9.3  MG  --  2.0   Liver Function Tests:  Recent Labs Lab 07/10/12 1930 07/11/12 0758  AST 14 14  ALT 17 17  ALKPHOS 73 67  BILITOT 0.3 0.5  PROT 6.7 6.5  ALBUMIN 3.9 3.5    Recent Labs Lab 07/10/12 1930  LIPASE 29   No results found for this basename: AMMONIA,  in the last 168  hours CBC:  Recent Labs Lab 07/10/12 1930 07/11/12 0758  WBC 8.1 7.9  HGB 13.5 13.7  HCT 40.8 41.5  MCV 84.8 84.9  PLT 301 296   Cardiac Enzymes:  Recent Labs Lab 07/10/12 1930 07/11/12 0152 07/11/12 0758  TROPONINI <0.30 <0.30 <0.30   BNP: BNP (last 3 results) No results found for this basename: PROBNP,  in the last 8760 hours CBG: No results found for this basename: GLUCAP,  in the last 168 hours     Signed:  MEMON,JEHANZEB  [facility] 07/11/2012, 2:28 PM

## 2013-02-09 ENCOUNTER — Encounter (HOSPITAL_COMMUNITY): Payer: Self-pay | Admitting: Emergency Medicine

## 2013-02-09 ENCOUNTER — Emergency Department (HOSPITAL_COMMUNITY)
Admission: EM | Admit: 2013-02-09 | Discharge: 2013-02-09 | Disposition: A | Discharge status: Home / Self Care | Payer: 59 | Attending: Emergency Medicine | Admitting: Emergency Medicine

## 2013-02-09 DIAGNOSIS — Z7982 Long term (current) use of aspirin: Secondary | ICD-10-CM | POA: Insufficient documentation

## 2013-02-09 DIAGNOSIS — Z79899 Other long term (current) drug therapy: Secondary | ICD-10-CM | POA: Insufficient documentation

## 2013-02-09 DIAGNOSIS — I10 Essential (primary) hypertension: Secondary | ICD-10-CM | POA: Insufficient documentation

## 2013-02-09 DIAGNOSIS — R5381 Other malaise: Secondary | ICD-10-CM | POA: Insufficient documentation

## 2013-02-09 DIAGNOSIS — E7800 Pure hypercholesterolemia, unspecified: Secondary | ICD-10-CM | POA: Insufficient documentation

## 2013-02-09 DIAGNOSIS — K219 Gastro-esophageal reflux disease without esophagitis: Secondary | ICD-10-CM | POA: Insufficient documentation

## 2013-02-09 DIAGNOSIS — Z8742 Personal history of other diseases of the female genital tract: Secondary | ICD-10-CM | POA: Insufficient documentation

## 2013-02-09 DIAGNOSIS — Z8739 Personal history of other diseases of the musculoskeletal system and connective tissue: Secondary | ICD-10-CM | POA: Insufficient documentation

## 2013-02-09 DIAGNOSIS — R531 Weakness: Secondary | ICD-10-CM

## 2013-02-09 LAB — COMPREHENSIVE METABOLIC PANEL
ALT: 52 U/L — ABNORMAL HIGH (ref 0–35)
Alkaline Phosphatase: 101 U/L (ref 39–117)
CO2: 25 mEq/L (ref 19–32)
GFR calc Af Amer: >90 mL/min (ref >90)
GFR calc non Af Amer: >90 mL/min (ref >90)
Glucose, Bld: 93 mg/dL (ref 70–99)
Potassium: 4.3 mEq/L (ref 3.7–5.3)
Sodium: 140 mEq/L (ref 137–147)

## 2013-02-09 LAB — CBC WITH DIFFERENTIAL/PLATELET
Eosinophils Relative: 3 % (ref 0–5)
Hemoglobin: 14.5 g/dL (ref 12.0–15.0)
Lymphocytes Relative: 15 % (ref 12–46)
Lymphs Abs: 0.9 10*3/uL (ref 0.7–4.0)
MCV: 84.1 fL (ref 78.0–100.0)
Monocytes Relative: 10 % (ref 3–12)
Neutrophils Relative %: 71 % (ref 43–77)
Platelets: 246 10*3/uL (ref 150–400)
RBC: 5.23 MIL/uL — ABNORMAL HIGH (ref 3.87–5.11)
WBC: 5.8 10*3/uL (ref 4.0–10.5)

## 2013-02-09 NOTE — ED Provider Notes (Signed)
CSN: [id number]     Arrival date & time 02/09/13  1253 History   First MD Initiated Contact with Patient 02/09/13 1345     Chief Complaint  Patient presents with  . Hypertension   (Consider location/radiation/quality/duration/timing/severity/associated sxs/prior Treatment) HPI Comments: Patient is a 52 year old female with history of high cholesterol and acid reflux. She presents today with complaints of not feeling well. 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 She denies any headache, chest pain, or shortness of breath. She states she just didn't feel quite right. She went to her primary doctor's office for evaluation. Apparently her blood pressure was taken there and was found to be 200 systolic. She was then sent directly here to be evaluated. She is currently feeling better and her blood pressure has improved.  Patient is a 52 y.o. female presenting with hypertension. The history is provided by the patient.  Hypertension This is a new problem. The current episode started 1 to 2 hours ago. The problem occurs constantly. The problem has been resolved. Pertinent negatives include no chest pain and no shortness of breath. Nothing aggravates the symptoms. Nothing relieves the symptoms. She has tried nothing for the symptoms. The treatment provided no relief.    Past Medical History  Diagnosis Date  . PMDD (premenstrual dysphoric disorder)   . High cholesterol   . Chondromalacia of both patellae    Past Surgical History  Procedure Laterality Date  . Cesarean section  1996  . Cesarean section  1998  . Tubal ligation  1998  . Lasix      REFRACTIVE SURGERY-  RIGHT EYE  . Endometrial ablation  04/04/07    HER OPTION   Family History  Problem Relation Age of Onset  . Heart disease Father    History  Substance Use Topics  . Smoking status: Never Smoker   . Smokeless tobacco: Never Used  . Alcohol Use: Yes     Comment:  social   OB History   Grav Para Term Preterm Abortions TAB SAB Ect Mult Living   3 3 3       3      Review of Systems  Constitutional: Positive for fatigue.  Respiratory: Negative for shortness of breath.   Cardiovascular: Negative for chest pain.  All other systems reviewed and are negative.    Allergies  Simvastatin  Home Medications   Current Outpatient Rx  Name  Route  Sig  Dispense  Refill  . aspirin EC 81 MG tablet   Oral   Take 162-324 mg by mouth 2 (two) times daily. Two tablets chewed on initial dose, then advised to take 4 additional tablets prior to EMS arrival         . atorvastatin (LIPITOR) 10 MG tablet   Oral   Take 1 tablet (10 mg total) by mouth daily.   30 tablet   6   . cetirizine (ZYRTEC) 10 MG tablet   Oral   Take 10 mg by mouth daily.         . medroxyPROGESTERone (PROVERA) 10 MG tablet      Take one tablet by mouth daily for ten days.   30 tablet   3   . Oxymetazoline HCl (SINEX LONG-ACTING NA)   Nasal   Place 1 spray into the nose daily as needed (for allergy symptoms).         . pantoprazole (PROTONIX) 40 MG tablet  Oral   Take 1 tablet (40 mg total) by mouth daily.   30 tablet   1    BP 142/75  Pulse 86  Temp(Src) 98.4 F (36.9 C) (Oral)  Resp 16  SpO2 97%  LMP 12/29/2012 Physical Exam  Nursing note and vitals reviewed. Constitutional: She is oriented to person, place, and time. She appears well-developed and well-nourished. No distress.  HENT:  Head: Normocephalic and atraumatic.  Eyes: EOM are normal. Pupils are equal, round, and reactive to light.  Neck: Normal range of motion. Neck supple.  Cardiovascular: Normal rate and regular rhythm.  Exam reveals no gallop and no friction rub.   No murmur heard. Pulmonary/Chest: Effort normal and breath sounds normal. No respiratory distress. She has no wheezes.  Abdominal: Soft. Bowel sounds are normal. She exhibits no distension. There is no tenderness.  Musculoskeletal:  Normal range of motion.  Neurological: She is alert and oriented to person, place, and time. No cranial nerve deficit. She exhibits normal muscle tone. Coordination normal.  Skin: Skin is warm and dry. She is not diaphoretic.    ED Course  Procedures (including critical care time) Labs Review Labs Reviewed  CBC WITH DIFFERENTIAL  COMPREHENSIVE METABOLIC PANEL  TROPONIN I  TSH   Imaging Review No results found.  EKG Interpretation    Date/Time:  Wednesday February 09 2013 13:04:09 EST Ventricular Rate:  101 PR Interval:  140 QRS Duration: 82 QT Interval:  332 QTC Calculation: 430 R Axis:   53 Text Interpretation:  Sinus tachycardia Otherwise normal ECG Confirmed by DELOS  MD, Lavontay Kirk ([id number]) on 02/09/2013 2:33:24 PM            MDM  No diagnosis found. Patient is a 52 year old female presents after an episode of elevated blood pressure and weakness. This came on abruptly today while she was at work. She went to her doctor's office and due to the elevated blood pressure was sent here for further workup. Workup here reveals a normal EKG and laboratory studies which are essentially unremarkable. She was observed here for 2 hours during which time her blood pressure improved and her symptoms resolved. I am uncertain as to the exact etiology of this episode however nothing appears emergent. I will discharge her with instructions to followup or return if her symptoms worsen or change. She had no chest discomfort or shortness of breath during this episode and her EKG and troponin are negative. I strongly doubt a cardiac etiology however she understands to return if she develops chest pain or other symptoms.    Geoffery Lyons, MD 02/09/13 [phone]

## 2013-02-09 NOTE — ED Notes (Signed)
Pt. Stated, i was across the street and the nurse took my BP and I feel a little weak , and just unusual.

## 2013-02-17 ENCOUNTER — Encounter: Payer: Self-pay | Admitting: Cardiovascular Disease

## 2013-02-17 ENCOUNTER — Ambulatory Visit (INDEPENDENT_AMBULATORY_CARE_PROVIDER_SITE_OTHER): Payer: 59 | Admitting: Cardiovascular Disease

## 2013-02-17 VITALS — BP 139/89 | HR 98 | Ht 65.0 in | Wt 250.0 lb

## 2013-02-17 DIAGNOSIS — E7800 Pure hypercholesterolemia, unspecified: Secondary | ICD-10-CM

## 2013-02-17 DIAGNOSIS — R55 Syncope and collapse: Secondary | ICD-10-CM

## 2013-02-17 DIAGNOSIS — R002 Palpitations: Secondary | ICD-10-CM

## 2013-02-17 DIAGNOSIS — H539 Unspecified visual disturbance: Secondary | ICD-10-CM

## 2013-02-17 MED ORDER — NITROGLYCERIN 0.4 MG SL SUBL: 0.4000 mg | SUBLINGUAL_TABLET | SUBLINGUAL | Status: DC | PRN

## 2013-02-17 NOTE — Assessment & Plan Note (Signed)
Suggested she try Lipitor 10 mg daily. If she develops myalgias, could try 10 mg alternating with 5 mg. Also add coenzyme Q 10

## 2013-02-17 NOTE — Assessment & Plan Note (Signed)
Etiology of her episodes is not clear. She has vision blurriness, a strange feeling come over her like she is going to pass out, blood pressure is very high, red-faced, all from a sitting position on previous 4 episodes. She is curious to make sure that she's not having underlying arrhythmia. We'll order a 30 day monitor as episodes are rare and 48-hour monitor would likely not capture her episodes. 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

## 2013-02-17 NOTE — Progress Notes (Signed)
Patient ID: Allison Frazier, female    DOB: 1960-05-19, 53 y.o.   MRN: [id number]  HPI Comments: Allison Frazier is a pleasant 53 year old woman who works at [employer] with history of hyperlipidemia, obesity, presenting for evaluation of recent episodes of near syncope.  She reports that she's had for episodes of near syncope dating back to the summer of 2014. May 31st 2014 she was seen in the emergency room for a feeling that she was going to pass out/lightheadedness, feeling hot all over, severe hypertension with systolic pressures in the 200s, blurry vision. She reports that symptoms last for approximately 15 minutes or so. She has had 4 episodes total. Typically they come on while in a sitting position. Uncertain if she is having palpitations but feels her pulse is regular. Initially symptoms started 2 days after taking hormones provided by her GYN. She is uncertain if this is related. She did not take additional hormones after the event and has had several additional episodes.  Most recent episode 02/09/2013. One episode one week prior to that. On lasting approximately 15 minutes with similar symptoms of blurred vision, lightheadedness, red in the face, systolic pressures of 431, feeling that she is going to pass out, , feeling hot all over. Over the past several weeks, 3 weeks in particular, she has felt very tired.  She reports that she is tolerating Lipitor 10 mg every other day. Cholesterol has dropped from 240 down to 198  EKG is essentially normal with no significant ST or T wave changes   Outpatient Encounter Prescriptions as of 02/17/2013  Medication Sig  . aspirin EC 81 MG tablet Take 81 mg by mouth daily.   Marland Kitchen atorvastatin (LIPITOR) 10 MG tablet Take 10 mg by mouth every other day.  . cetirizine (ZYRTEC) 10 MG tablet Take 10 mg by mouth daily.  Marland Kitchen doxylamine, Sleep, (UNISOM) 25 MG tablet Take 25 mg by mouth at bedtime as needed for sleep.  . fluticasone (FLONASE) 50  MCG/ACT nasal spray Place 2 sprays into both nostrils daily.  Marland Kitchen ibuprofen (ADVIL,MOTRIN) 800 MG tablet Take 800 mg by mouth as needed.   . Oxymetazoline HCl (SINEX LONG-ACTING NA) Place 1 spray into the nose daily as needed (for allergy symptoms).  . pantoprazole (PROTONIX) 40 MG tablet Take 40 mg by mouth as needed (for reflux).   . Pseudoephedrine-DM-GG (PSEUDO-GUAIFEN-DEX PO) Take by mouth daily.     Review of Systems  Constitutional: Positive for fatigue.  HENT: Negative.   Eyes: Negative.   Respiratory: Negative.   Cardiovascular: Positive for palpitations.  Gastrointestinal: Negative.   Endocrine: Negative.   Musculoskeletal: Negative.   Skin: Negative.   Allergic/Immunologic: Negative.   Neurological: Negative.        Near syncope  Hematological: Negative.   Psychiatric/Behavioral: Negative.   All other systems reviewed and are negative.    BP 139/89  Pulse 98  Ht 5\' 5"  (1.651 m)  Wt 250 lb (113.399 kg)  BMI 41.60 kg/m2  LMP 12/29/2012  Physical Exam  Nursing note and vitals reviewed. Constitutional: She is oriented to person, place, and time. She appears well-developed and well-nourished.  HENT:  Head: Normocephalic.  Nose: Nose normal.  Mouth/Throat: Oropharynx is clear and moist.  Eyes: Conjunctivae are normal. Pupils are equal, round, and reactive to light.  Neck: Normal range of motion. Neck supple. No JVD present.  Cardiovascular: Normal rate, regular rhythm, S1 normal, S2 normal, normal heart sounds and intact distal pulses.  Exam reveals no gallop and no friction rub.   No murmur heard. Pulmonary/Chest: Effort normal and breath sounds normal. No respiratory distress. She has no wheezes. She has no rales. She exhibits no tenderness.  Abdominal: Soft. Bowel sounds are normal. She exhibits no distension. There is no tenderness.  Musculoskeletal: Normal range of motion. She exhibits no edema and no tenderness.  Lymphadenopathy:    She has no cervical  adenopathy.  Neurological: She is alert and oriented to person, place, and time. Coordination normal.  Skin: Skin is warm and dry. No rash noted. No erythema.  Psychiatric: She has a normal mood and affect. Her behavior is normal. Judgment and thought content normal.    Assessment and Plan

## 2013-02-17 NOTE — Patient Instructions (Addendum)
We will order an event monitor for near syncope, vision changes Take NTG for severely  Elevated blood pressures, near syncope episodes We will set up a treadmill at some point (check schedule)  Please call us if you have new issues that need to be addressed before your next appt.

## 2013-02-17 NOTE — Assessment & Plan Note (Signed)
We have encouraged continued exercise, careful diet management in an effort to lose weight. 

## 2013-02-22 DIAGNOSIS — R55 Syncope and collapse: Secondary | ICD-10-CM

## 2013-02-24 ENCOUNTER — Other Ambulatory Visit (HOSPITAL_COMMUNITY): Payer: Self-pay | Admitting: Family Medicine

## 2013-02-24 DIAGNOSIS — R55 Syncope and collapse: Secondary | ICD-10-CM

## 2013-02-28 ENCOUNTER — Other Ambulatory Visit (HOSPITAL_COMMUNITY): Payer: 59

## 2013-02-28 ENCOUNTER — Ambulatory Visit (HOSPITAL_COMMUNITY)
Admission: RE | Admit: 2013-02-28 | Discharge: 2013-02-28 | Disposition: A | Discharge status: Home / Self Care | Payer: 59 | Source: Ambulatory Visit | Attending: Family Medicine | Admitting: Family Medicine

## 2013-02-28 DIAGNOSIS — R55 Syncope and collapse: Secondary | ICD-10-CM

## 2013-03-01 ENCOUNTER — Ambulatory Visit (HOSPITAL_COMMUNITY)
Admission: RE | Admit: 2013-03-01 | Discharge: 2013-03-01 | Disposition: A | Discharge status: Home / Self Care | Payer: 59 | Source: Ambulatory Visit | Attending: Family Medicine | Admitting: Family Medicine

## 2013-03-01 DIAGNOSIS — R5381 Other malaise: Secondary | ICD-10-CM | POA: Insufficient documentation

## 2013-03-01 DIAGNOSIS — R55 Syncope and collapse: Secondary | ICD-10-CM | POA: Insufficient documentation

## 2013-03-01 DIAGNOSIS — R5383 Other fatigue: Secondary | ICD-10-CM

## 2013-03-01 MED ORDER — GADOBENATE DIMEGLUMINE 529 MG/ML IV SOLN
15.0000 mL | Freq: Once | INTRAVENOUS | Status: AC | PRN
Start: 2013-03-01 — End: 2013-03-01
  Administered 2013-03-01: 15 mL via INTRAVENOUS

## 2013-04-04 ENCOUNTER — Telehealth: Payer: Self-pay

## 2013-04-04 NOTE — Telephone Encounter (Signed)
Reviewed event monitor results w/ pt, "NSR w/ rare PVCs". Pt to call w/ any questions or concerns.

## 2013-04-06 ENCOUNTER — Encounter (INDEPENDENT_AMBULATORY_CARE_PROVIDER_SITE_OTHER): Payer: 59

## 2013-04-06 ENCOUNTER — Other Ambulatory Visit: Payer: Self-pay

## 2013-04-06 DIAGNOSIS — H539 Unspecified visual disturbance: Secondary | ICD-10-CM

## 2013-04-06 DIAGNOSIS — R002 Palpitations: Secondary | ICD-10-CM

## 2013-04-06 DIAGNOSIS — R55 Syncope and collapse: Secondary | ICD-10-CM

## 2013-05-31 ENCOUNTER — Encounter: Payer: Self-pay | Admitting: Women's Health

## 2013-05-31 ENCOUNTER — Ambulatory Visit (INDEPENDENT_AMBULATORY_CARE_PROVIDER_SITE_OTHER): Payer: 59 | Admitting: Women's Health

## 2013-05-31 DIAGNOSIS — N949 Unspecified condition associated with female genital organs and menstrual cycle: Secondary | ICD-10-CM

## 2013-05-31 DIAGNOSIS — N912 Amenorrhea, unspecified: Secondary | ICD-10-CM

## 2013-05-31 DIAGNOSIS — Z79890 Hormone replacement therapy (postmenopausal): Secondary | ICD-10-CM

## 2013-05-31 DIAGNOSIS — R350 Frequency of micturition: Secondary | ICD-10-CM

## 2013-05-31 DIAGNOSIS — N390 Urinary tract infection, site not specified: Secondary | ICD-10-CM

## 2013-05-31 LAB — URINALYSIS W MICROSCOPIC + REFLEX CULTURE
Bilirubin Urine: NEGATIVE
CASTS: NONE SEEN
CRYSTALS: NONE SEEN
Glucose, UA: NEGATIVE mg/dL
Ketones, ur: NEGATIVE mg/dL
NITRITE: NEGATIVE
PH: 5.5 (ref 5.0–8.0)
Protein, ur: NEGATIVE mg/dL
SPECIFIC GRAVITY, URINE: 1.025 (ref 1.005–1.030)
UROBILINOGEN UA: 0.2 mg/dL (ref 0.0–1.0)

## 2013-05-31 MED ORDER — ESTRADIOL 0.05 MG/24HR TD PTWK: 0.0500 mg | MEDICATED_PATCH | TRANSDERMAL | Status: DC

## 2013-05-31 MED ORDER — PROGESTERONE MICRONIZED 100 MG PO CAPS: 100.0000 mg | ORAL_CAPSULE | Freq: Every day | ORAL | Status: DC

## 2013-05-31 MED ORDER — CIPROFLOXACIN HCL 250 MG PO TABS: 250.0000 mg | ORAL_TABLET | Freq: Two times a day (BID) | ORAL | Status: DC

## 2013-05-31 NOTE — Progress Notes (Signed)
Patient ID: Allison Frazier, female   DOB: 03-19-1960, 53 y.o.   MRN: [id number] Presents with frequent urination for past week and feeling UTI not completely resolved from treatment from UTI at urgent care April 14 with Bactrim. Took three-day prescribed Bactrim and then also took a few additional doses from daughters acne prescription. Also complaint of  vaginal dryness, hot flushes for past few months and amenorrhea.   Denies discharge, fever, back pain. History of ablation for menorrhagia. LMP 12/2012. Same partner/BTL.  Exam:  Appears well, fanning self. UA: 21-50 WBC, 0-2 RBC, few bacteria  UTI Perimenopausal symptoms  Plan: Cipro 250 mg BID x 5 days Rx, proper use and side effects discussed. HRT options reviewed, Climara 0.05 mg weekly, proper use and risk of stroke/clot/breast cancer discussed. Prometrium 100 mg x 12 days, proper use and side effects discussed. Follow up at annual visit with Dr. Toney Rakes on Jul 01, 2013 for evaluation of medications/symptom relief. UTI prevention discussed. Urine culture and FSH pending.

## 2013-05-31 NOTE — Patient Instructions (Signed)
hrt Hormone Therapy At menopause, your body begins making less estrogen and progesterone hormones. This causes the body to stop having menstrual periods. This is because estrogen and progesterone hormones control your periods and menstrual cycle. A lack of estrogen may cause symptoms such as:  Hot flushes (or hot flashes).  Vaginal dryness.  Dry skin.  Loss of sex drive.  Risk of bone loss (osteoporosis). When this happens, you may choose to take hormone therapy to get back the estrogen lost during menopause. When the hormone estrogen is given alone, it is usually referred to as ET (Estrogen Therapy). When the hormone progestin is combined with estrogen, it is generally called HT (Hormone Therapy). This was formerly known as hormone replacement therapy (HRT). Your caregiver can help you make a decision on what will be best for you. The decision to use HT seems to change often as new studies are done. Many studies do not agree on the benefits of hormone replacement therapy. LIKELY BENEFITS OF HT INCLUDE PROTECTION FROM:  Hot Flushes (also called hot flashes) - A hot flush is a sudden feeling of heat that spreads over the face and body. The skin may redden like a blush. It is connected with sweats and sleep disturbance. Women going through menopause may have hot flushes a few times a month or several times per day depending on the woman.  Osteoporosis (bone loss)- Estrogen helps guard against bone loss. After menopause, a woman's bones slowly lose calcium and become weak and brittle. As a result, bones are more likely to break. The hip, wrist, and spine are affected most often. Hormone therapy can help slow bone loss after menopause. Weight bearing exercise and taking calcium with vitamin D also can help prevent bone loss. There are also medications that your caregiver can prescribe that can help prevent osteoporosis.  Vaginal Dryness - Loss of estrogen causes changes in the vagina. Its lining may  become thin and dry. These changes can cause pain and bleeding during sexual intercourse. Dryness can also lead to infections. This can cause burning and itching. (Vaginal estrogen treatment can help relieve pain, itching, and dryness.)  Urinary Tract Infections are more common after menopause because of lack of estrogen. Some women also develop urinary incontinence because of low estrogen levels in the vagina and bladder.  Possible other benefits of estrogen include a positive effect on mood and short-term memory in women. RISKS AND COMPLICATIONS  Using estrogen alone without progesterone causes the lining of the uterus to grow. This increases the risk of lining of the uterus (endometrial) cancer. Your caregiver should give another hormone called progestin if you have a uterus.  Women who take combined (estrogen and progestin) HT appear to have an increased risk of breast cancer. The risk appears to be small, but increases throughout the time that HT is taken.  Combined therapy also makes the breast tissue slightly denser which makes it harder to read mammograms (breast X-rays).  Combined, estrogen and progesterone therapy can be taken together every day, in which case there may be spotting of blood. HT therapy can be taken cyclically in which case you will have menstrual periods. Cyclically means HT is taken for a set amount of days, then not taken, then this process is repeated.  HT may increase the risk of stroke, heart attack, breast cancer and forming blood clots in your leg.  Transdermal estrogen (estrogen that is absorbed through the skin with a patch or a cream) may have more positive results  with:  Cholesterol.  Blood pressure.  Blood clots. Having the following conditions may indicate you should not have HT:  Endometrial cancer.  Liver disease.  Breast cancer.  Heart disease.  History of blood clots.  Stroke. TREATMENT   If you choose to take HT and have a uterus,  usually estrogen and progestin are prescribed.  Your caregiver will help you decide the best way to take the medications.  Possible ways to take estrogen include:  Pills.  Patches.  Gels.  Sprays.  Vaginal estrogen cream, rings and tablets.  It is best to take the lowest dose possible that will help your symptoms and take them for the shortest period of time that you can.  Hormone therapy can help relieve some of the problems (symptoms) that affect women at menopause. Before making a decision about HT, talk to your caregiver about what is best for you. Be well informed and comfortable with your decisions. HOME CARE INSTRUCTIONS   Follow your caregivers advice when taking the medications.  A Pap test is done to screen for cervical cancer.  The first Pap test should be done at age 85.  Between ages 60 and 60, Pap tests are repeated every 2 years.  Beginning at age 7, you are advised to have a Pap test every 3 years as long as your past 3 Pap tests have been normal.  Some women have medical problems that increase the chance of getting cervical cancer. Talk to your caregiver about these problems. It is especially important to talk to your caregiver if a new problem develops soon after your last Pap test. In these cases, your caregiver may recommend more frequent screening and Pap tests.  The above recommendations are the same for women who have or have not gotten the vaccine for HPV (Human Papillomavirus).  If you had a hysterectomy for a problem that was not a cancer or a condition that could lead to cancer, then you no longer need Pap tests. However, even if you no longer need a Pap test, a regular exam is a good idea to make sure no other problems are starting.   If you are between ages 63 and 57, and you have had normal Pap tests going back 10 years, you no longer need Pap tests. However, even if you no longer need a Pap test, a regular exam is a good idea to make sure no  other problems are starting.   If you have had past treatment for cervical cancer or a condition that could lead to cancer, you need Pap tests and screening for cancer for at least 20 years after your treatment.  If Pap tests have been discontinued, risk factors (such as a new sexual partner) need to be re-assessed to determine if screening should be resumed.  Some women may need screenings more often if they are at high risk for cervical cancer.  Get mammograms done as per the advice of your caregiver. SEEK IMMEDIATE MEDICAL CARE IF:  You develop abnormal vaginal bleeding.  You have pain or swelling in your legs, shortness of breath, or chest pain.  You develop dizziness or headaches.  You have lumps or changes in your breasts or armpits.  You have slurred speech.  You develop weakness or numbness of your arms or legs.  You have pain, burning, or bleeding when urinating.  You develop abdominal pain. Document Released: 10/26/2002 Document Revised: 04/21/2011 Document Reviewed: 02/13/2010 [facility] Patient Information 2014 [employer], [place]. Urinary Tract Infection Urinary  tract infections (UTIs) can develop anywhere along your urinary tract. Your urinary tract is your body's drainage system for removing wastes and extra water. Your urinary tract includes two kidneys, two ureters, a bladder, and a urethra. Your kidneys are a pair of bean-shaped organs. Each kidney is about the size of your fist. They are located below your ribs, one on each side of your spine. CAUSES Infections are caused by microbes, which are microscopic organisms, including fungi, viruses, and bacteria. These organisms are so small that they can only be seen through a microscope. Bacteria are the microbes that most commonly cause UTIs. SYMPTOMS  Symptoms of UTIs may vary by age and gender of the patient and by the location of the infection. Symptoms in Ademide Schaberg women typically include a frequent and intense urge to  urinate and a painful, burning feeling in the bladder or urethra during urination. Older women and men are more likely to be tired, shaky, and weak and have muscle aches and abdominal pain. A fever may mean the infection is in your kidneys. Other symptoms of a kidney infection include pain in your back or sides below the ribs, nausea, and vomiting. DIAGNOSIS To diagnose a UTI, your caregiver will ask you about your symptoms. Your caregiver also will ask to provide a urine sample. The urine sample will be tested for bacteria and white blood cells. White blood cells are made by your body to help fight infection. TREATMENT  Typically, UTIs can be treated with medication. Because most UTIs are caused by a bacterial infection, they usually can be treated with the use of antibiotics. The choice of antibiotic and length of treatment depend on your symptoms and the type of bacteria causing your infection. HOME CARE INSTRUCTIONS  If you were prescribed antibiotics, take them exactly as your caregiver instructs you. Finish the medication even if you feel better after you have only taken some of the medication.  Drink enough water and fluids to keep your urine clear or pale yellow.  Avoid caffeine, tea, and carbonated beverages. They tend to irritate your bladder.  Empty your bladder often. Avoid holding urine for long periods of time.  Empty your bladder before and after sexual intercourse.  After a bowel movement, women should cleanse from front to back. Use each tissue only once. SEEK MEDICAL CARE IF:   You have back pain.  You develop a fever.  Your symptoms do not begin to resolve within 3 days. SEEK IMMEDIATE MEDICAL CARE IF:   You have severe back pain or lower abdominal pain.  You develop chills.  You have nausea or vomiting.  You have continued burning or discomfort with urination. MAKE SURE YOU:   Understand these instructions.  Will watch your condition.  Will get help right  away if you are not doing well or get worse. Document Released: 11/06/2004 Document Revised: 07/29/2011 Document Reviewed: 03/07/2011 [facility] Patient Information 2014 [place].

## 2013-06-01 LAB — FOLLICLE STIMULATING HORMONE: FSH: 48.3 m[IU]/mL

## 2013-06-03 LAB — URINE CULTURE: Colony Count: >=100000

## 2013-06-14 ENCOUNTER — Other Ambulatory Visit: Payer: Self-pay | Admitting: Gynecology

## 2013-06-20 ENCOUNTER — Telehealth: Payer: Self-pay

## 2013-06-20 NOTE — Telephone Encounter (Signed)
Took antibiotic last week for UTI and now with a vag yeast infection. Would like Diflucan Rx. Leaving for vacation on Weds. Rx?

## 2013-06-21 MED ORDER — FLUCONAZOLE 150 MG PO TABS: 150.0000 mg | ORAL_TABLET | Freq: Once | ORAL | Status: DC

## 2013-06-21 NOTE — Telephone Encounter (Signed)
Left on voicemail this has been done.

## 2013-06-21 NOTE — Telephone Encounter (Signed)
Diflucan 150 rx ok

## 2013-07-01 ENCOUNTER — Encounter: Payer: 59 | Admitting: Gynecology

## 2013-07-22 ENCOUNTER — Encounter: Payer: 59 | Admitting: Gynecology

## 2013-08-23 ENCOUNTER — Encounter: Payer: 59 | Admitting: Gynecology

## 2013-09-20 ENCOUNTER — Encounter: Payer: 59 | Admitting: Gynecology

## 2013-12-12 ENCOUNTER — Encounter: Payer: Self-pay | Admitting: Women's Health

## 2015-04-02 ENCOUNTER — Other Ambulatory Visit (HOSPITAL_COMMUNITY)
Admission: RE | Admit: 2015-04-02 | Discharge: 2015-04-02 | Disposition: A | Discharge status: Home / Self Care | Payer: Managed Care, Other (non HMO) | Source: Ambulatory Visit | Attending: Gynecology | Admitting: Gynecology

## 2015-04-02 ENCOUNTER — Ambulatory Visit (INDEPENDENT_AMBULATORY_CARE_PROVIDER_SITE_OTHER): Payer: Managed Care, Other (non HMO) | Admitting: Gynecology

## 2015-04-02 ENCOUNTER — Encounter: Payer: Self-pay | Admitting: Gynecology

## 2015-04-02 VITALS — BP 128/80 | Ht 65.0 in | Wt 261.0 lb

## 2015-04-02 DIAGNOSIS — Z01419 Encounter for gynecological examination (general) (routine) without abnormal findings: Secondary | ICD-10-CM

## 2015-04-02 DIAGNOSIS — R232 Flushing: Secondary | ICD-10-CM

## 2015-04-02 DIAGNOSIS — Z124 Encounter for screening for malignant neoplasm of cervix: Secondary | ICD-10-CM | POA: Diagnosis not present

## 2015-04-02 DIAGNOSIS — Z1159 Encounter for screening for other viral diseases: Secondary | ICD-10-CM | POA: Diagnosis not present

## 2015-04-02 DIAGNOSIS — Z1151 Encounter for screening for human papillomavirus (HPV): Secondary | ICD-10-CM | POA: Diagnosis present

## 2015-04-02 DIAGNOSIS — Z79890 Hormone replacement therapy (postmenopausal): Secondary | ICD-10-CM

## 2015-04-02 DIAGNOSIS — E663 Overweight: Secondary | ICD-10-CM | POA: Diagnosis not present

## 2015-04-02 LAB — COMPREHENSIVE METABOLIC PANEL
ALBUMIN: 4.4 g/dL (ref 3.6–5.1)
ALK PHOS: 74 U/L (ref 33–130)
ALT: 25 U/L (ref 6–29)
AST: 13 U/L (ref 10–35)
BUN: 15 mg/dL (ref 7–25)
CALCIUM: 10.1 mg/dL (ref 8.6–10.4)
CO2: 21 mmol/L (ref 20–31)
Chloride: 104 mmol/L (ref 98–110)
Creat: 0.6 mg/dL (ref 0.50–1.05)
Glucose, Bld: 98 mg/dL (ref 65–99)
POTASSIUM: 4.2 mmol/L (ref 3.5–5.3)
Sodium: 138 mmol/L (ref 135–146)
TOTAL PROTEIN: 7 g/dL (ref 6.1–8.1)
Total Bilirubin: 0.6 mg/dL (ref 0.2–1.2)

## 2015-04-02 LAB — CBC WITH DIFFERENTIAL/PLATELET
BASOS ABS: 0.1 10*3/uL (ref 0.0–0.1)
Basophils Relative: 1 % (ref 0–1)
Eosinophils Absolute: 0.1 10*3/uL (ref 0.0–0.7)
Eosinophils Relative: 2 % (ref 0–5)
HEMATOCRIT: 45.6 % (ref 36.0–46.0)
Hemoglobin: 15.4 g/dL — ABNORMAL HIGH (ref 12.0–15.0)
LYMPHS ABS: 1.4 10*3/uL (ref 0.7–4.0)
LYMPHS PCT: 25 % (ref 12–46)
MCH: 28.7 pg (ref 26.0–34.0)
MCHC: 33.8 g/dL (ref 30.0–36.0)
MCV: 84.9 fL (ref 78.0–100.0)
MPV: 10.9 fL (ref 8.6–12.4)
Monocytes Absolute: 0.4 10*3/uL (ref 0.1–1.0)
Monocytes Relative: 7 % (ref 3–12)
NEUTROS PCT: 65 % (ref 43–77)
Neutro Abs: 3.6 10*3/uL (ref 1.7–7.7)
Platelets: 276 10*3/uL (ref 150–400)
RBC: 5.37 MIL/uL — ABNORMAL HIGH (ref 3.87–5.11)
RDW: 13.8 % (ref 11.5–15.5)
WBC: 5.6 10*3/uL (ref 4.0–10.5)

## 2015-04-02 LAB — LIPID PANEL
CHOL/HDL RATIO: 5.2 ratio — AB (ref <=5.0)
CHOLESTEROL: 240 mg/dL — AB (ref 125–200)
HDL: 46 mg/dL (ref >=46)
LDL Cholesterol: 144 mg/dL — ABNORMAL HIGH (ref <130)
Triglycerides: 250 mg/dL — ABNORMAL HIGH (ref <150)
VLDL: 50 mg/dL — ABNORMAL HIGH (ref <30)

## 2015-04-02 LAB — URINALYSIS W MICROSCOPIC + REFLEX CULTURE
BILIRUBIN URINE: NEGATIVE
CRYSTALS: NONE SEEN [HPF]
Casts: NONE SEEN [LPF]
Glucose, UA: NEGATIVE
Hgb urine dipstick: NEGATIVE
KETONES UR: NEGATIVE
Nitrite: NEGATIVE
Protein, ur: NEGATIVE
RBC / HPF: NONE SEEN RBC/HPF (ref <=2)
SPECIFIC GRAVITY, URINE: 1.023 (ref 1.001–1.035)
Yeast: NONE SEEN [HPF]
pH: 5.5 (ref 5.0–8.0)

## 2015-04-02 LAB — HEPATITIS C ANTIBODY: HCV AB: NEGATIVE

## 2015-04-02 LAB — TSH: TSH: 0.97 mIU/L

## 2015-04-02 MED ORDER — ESTRADIOL 1 MG PO TABS: 1.0000 mg | ORAL_TABLET | Freq: Every day | ORAL | Status: DC

## 2015-04-02 MED ORDER — PROGESTERONE MICRONIZED 100 MG PO CAPS: ORAL_CAPSULE | ORAL | Status: DC

## 2015-04-02 NOTE — Progress Notes (Signed)
Allison Frazier [birth year] [id number]   History:    55 y.o.  for annual gyn exam who was last gynecological examination was in 2015. Patient stated her last menstrual cycle was in 2015 at which time she had an [facility] which was in the menopausal range with a value 48.3. When she was treated for urinary tract infection April 2015 due to the fact that she was menopausal I are nurse practitioner start her on Climara transdermal patch once weekly 0.05 mg with the addition of Prometrium for 12 days of the month but she states she bled for an entire month that she discontinued. She is suffering from hot flashes, irritability, mood swing, vaginal dryness and decreased libido. She was weighing 243 pounds and is up to 261 pounds but is currently on a weight reduction program at [facility]. Patient with no previous abnormal Pap smear. She had a normal colonoscopy at the age of 38. And she's had a previous tubal ligation. She was being followed by [facility] but has not seen her PCP in over a year. She has had history of hyperlipidemia but discontinued the Zocor that she had been prescribed by them because of side effects. She's currently on no medication. She is fasting today.  Past medical history,surgical history, family history and social history were all reviewed and documented in the EPIC chart.  Gynecologic History Patient's last menstrual period was 12/29/2012. Contraception: tubal ligation Last Pap: 2014. Results were: normal Last mammogram: 2013. Results were: normal  Obstetric History OB History  Gravida Para Term Preterm AB SAB TAB Ectopic Multiple Living  3 3 3       3     # Outcome Date GA Lbr Len/2nd Weight Sex Delivery Anes PTL Lv  3 Term     F CS-Unspec  N Y  2 Term     M CS-Unspec  N Y  1 Term     F Vag-Spont  N Y       ROS: A ROS was performed and pertinent positives and negatives are included in the history.  GENERAL: No fevers or chills. HEENT:  No change in vision, no earache, sore throat or sinus congestion. NECK: No pain or stiffness. CARDIOVASCULAR: No chest pain or pressure. No palpitations. PULMONARY: No shortness of breath, cough or wheeze. GASTROINTESTINAL: No abdominal pain, nausea, vomiting or diarrhea, melena or bright red blood per rectum. GENITOURINARY: No urinary frequency, urgency, hesitancy or dysuria. MUSCULOSKELETAL: No joint or muscle pain, no back pain, no recent trauma. DERMATOLOGIC: No rash, no itching, no lesions. ENDOCRINE: No polyuria, polydipsia, no heat or cold intolerance. No recent change in weight. HEMATOLOGICAL: No anemia or easy bruising or bleeding. NEUROLOGIC: No headache, seizures, numbness, tingling or weakness. PSYCHIATRIC: No depression, no loss of interest in normal activity or change in sleep pattern.     Exam: chaperone present  BP 128/80 mmHg  Ht 5\' 5"  (1.651 m)  Wt 261 lb (118.389 kg)  BMI 43.43 kg/m2  LMP 12/29/2012  Body mass index is 43.43 kg/(m^2).  General appearance : Well developed well nourished female. No acute distress HEENT: Eyes: no retinal hemorrhage or exudates,  Neck supple, trachea midline, no carotid bruits, no thyroidmegaly Lungs: Clear to auscultation, no rhonchi or wheezes, or rib retractions  Heart: Regular rate and rhythm, no murmurs or gallops Breast:Examined in sitting and supine position were symmetrical in appearance, no palpable masses or tenderness,  no skin retraction, no nipple inversion, no nipple discharge,  no skin discoloration, no axillary or supraclavicular lymphadenopathy Abdomen: no palpable masses or tenderness, no rebound or guarding Extremities: no edema or skin discoloration or tenderness  Pelvic:  Bartholin, Urethra, Skene Glands: Within normal limits             Vagina: No gross lesions or discharge  Cervix: No gross lesions or discharge  Uterus  retroverted, normal size, shape and consistency, non-tender and mobile  Adnexa  Without masses or  tenderness  Anus and perineum  normal   Rectovaginal  normal sphincter tone without palpated masses or tenderness             Hemoccult cards provided     Assessment/Plan:  55 y.o. female for annual exam with past history of hyperlipidemia has not followed up with her PCP and she discontinued her Zocor herself because of side effects. Currently on no medication. We are going to check the following screening blood work: Fasting lipid profile, comprehensive metabolic panel, TSH, CBC, and urinalysis. Pap smear with HPV screening was done today. Patient was provided with fecal Hemoccult cards to submit to the office for testing. Patient to schedule mammogram in the next few months I recommended three-dimensional mammogram due to the fact that her previous mammogram demonstrated that her breasts were dense. For her vasomotor symptoms of the menopause she's going to be started on Estrace 1 mg daily with the addition of Prometrium 200 mg for the first 12 days of the month. The risks and benefits and pros and cons of hormone replacement therapy were discussed with the patient to include DVT, pulmonary embolism, and breast cancer. We discussed in detail the women's health initiative study as well as the new guidelines. Literature information was provided.   New CDC guidelines is recommending patients be tested once in her lifetime for hepatitis C antibody who were born between [birth year] through 1965. This was discussed with the patient today and has agreed to be tested today.   Terrance Mass MD, 10:16 AM 04/02/2015

## 2015-04-02 NOTE — Patient Instructions (Signed)
Progesterone capsules What is this medicine? PROGESTERONE (proe JES ter one) is a female hormone. This medicine is used to prevent the overgrowth of the lining of the uterus in women who are taking estrogens for the symptoms of menopause. It is also used to treat secondary amenorrhea. This is when a woman stops getting menstrual periods due to low levels of progesterone. This medicine may be used for other purposes; ask your health care provider or pharmacist if you have questions. What should I tell my health care provider before I take this medicine? They need to know if you have any of these conditions: -autoimmune disease like systemic lupus erythematosus (SLE) -blood vessel disease, blood clotting disorder, or suffered a stroke -breast, cervical or vaginal cancer -dementia -diabetes -kidney or liver disease -heart disease, high blood pressure or recent heart attack -high blood lipids or cholesterol -hysterectomy -recent miscarriage -tobacco smoker -vaginal bleeding -an unusual or allergic reaction to progesterone, peanuts, other medicines, foods, dyes, or preservatives -pregnant or trying to get pregnant -breast-feeding How should I use this medicine? Take this medicine by mouth with a glass of water. Follow the directions on the prescription label. Take your doses at regular intervals. Do not take your medicine more often than directed. Talk to your pediatrician regarding the use of this medicine in children. Special care may be needed. A patient package insert for the product will be given with each prescription and refill. Read this sheet carefully each time. The sheet may change frequently. Overdosage: If you think you have taken too much of this medicine contact a poison control center or emergency room at once. NOTE: This medicine is only for you. Do not share this medicine with others. What if I miss a dose? If you miss a dose, take it as soon as you can. If it is almost time  for your next dose, take only that dose. Do not take double or extra doses. What may interact with this medicine? Do not take this medicine with any of the following medications: -bosentan This medicine may also interact with the following medications: -barbiturate medicines for sleep or seizures -bexarotene -carbamazepine -ethotoin -ketoconazole -phenytoin -rifampin This list may not describe all possible interactions. Give your health care provider a list of all the medicines, herbs, non-prescription drugs, or dietary supplements you use. Also tell them if you smoke, drink alcohol, or use illegal drugs. Some items may interact with your medicine. What should I watch for while using this medicine? Visit your doctor or health care professional for regular checks on your progress. This medicine can cause swelling, tenderness, or bleeding of the gums. Be careful when brushing and flossing teeth. See your dentist regularly for routine dental care. You may get drowsy or dizzy. Do not drive, use machinery, or do anything that needs mental alertness until you know how this drug affects you. Do not stand or sit up quickly, especially if you are an older patient. This reduces the risk of dizzy or fainting spells. What side effects may I notice from receiving this medicine? Side effects that you should report to your doctor or health care professional as soon as possible: -allergic reactions like skin rash, itching or hives, swelling of the face, lips, or tongue -breast tissue changes or discharge -changes in vaginal bleeding during your period or between your periods -depression -muscle or bone pain -numbness or pain in the arm or leg -pain in the chest, groin or leg -seizures or tremors -severe headache -stomach pain -sudden  shortness of breath -unusually weak or tired -vision or speech problems -yellowing of skin or eyes Side effects that usually do not require medical attention (report to  your doctor or health care professional if they continue or are bothersome): -acne -fluid retention and swelling -increased in appetite -mood changes, anxiety, depression, frustration, anger, or emotional outbursts -nausea, vomiting -sweating or hot flashes This list may not describe all possible side effects. Call your doctor for medical advice about side effects. You may report side effects to FDA at 1-800-FDA-1088. Where should I keep my medicine? Keep out of the reach of children. Store at room temperature between 15 and 30 degrees C (59 and 86 degrees F). Protect from light. Keep container tightly closed. Throw away any unused medicine after the expiration date. NOTE: This sheet is a summary. It may not cover all possible information. If you have questions about this medicine, talk to your doctor, pharmacist, or health care provider.    2016, Elsevier/Gold Standard. (2008-01-13 11:43:48) Estradiol tablets What is this medicine? ESTRADIOL (es tra DYE ole) is an estrogen. It is mostly used as hormone replacement in menopausal women. It helps to treat hot flashes and prevent osteoporosis. It is also used to treat women with low estrogen levels or those who have had their ovaries removed. This medicine may be used for other purposes; ask your health care provider or pharmacist if you have questions. What should I tell my health care provider before I take this medicine? They need to know if you have or ever had any of these conditions: -abnormal vaginal bleeding -blood vessel disease or blood clots -breast, cervical, endometrial, ovarian, liver, or uterine cancer -dementia -diabetes -gallbladder disease -heart disease or recent heart attack -high blood pressure -high cholesterol -high level of calcium in the blood -hysterectomy -kidney disease -liver disease -migraine headaches -protein C deficiency -protein S deficiency -stroke -systemic lupus erythematosus (SLE) -tobacco  smoker -an unusual or allergic reaction to estrogens, other hormones, medicines, foods, dyes, or preservatives -pregnant or trying to get pregnant -breast-feeding How should I use this medicine? Take this medicine by mouth. To reduce nausea, this medicine may be taken with food. Follow the directions on the prescription label. Take this medicine at the same time each day and in the order directed on the package. Do not take your medicine more often than directed. Contact your pediatrician regarding the use of this medicine in children. Special care may be needed. A patient package insert for the product will be given with each prescription and refill. Read this sheet carefully each time. The sheet may change frequently. Overdosage: If you think you have taken too much of this medicine contact a poison control center or emergency room at once. NOTE: This medicine is only for you. Do not share this medicine with others. What if I miss a dose? If you miss a dose, take it as soon as you can. If it is almost time for your next dose, take only that dose. Do not take double or extra doses. What may interact with this medicine? Do not take this medicine with any of the following medications: -aromatase inhibitors like aminoglutethimide, anastrozole, exemestane, letrozole, testolactone This medicine may also interact with the following medications: -carbamazepine -certain antibiotics used to treat infections -certain barbiturates or benzodiazepines used for inducing sleep or treating seizures -grapefruit juice -medicines for fungus infections like itraconazole and ketoconazole -raloxifene or tamoxifen -rifabutin, rifampin, or rifapentine -ritonavir -St. John's Wort -warfarin This list may not describe all  possible interactions. Give your health care provider a list of all the medicines, herbs, non-prescription drugs, or dietary supplements you use. Also tell them if you smoke, drink alcohol, or use  illegal drugs. Some items may interact with your medicine. What should I watch for while using this medicine? Visit your doctor or health care professional for regular checks on your progress. You will need a regular breast and pelvic exam and Pap smear while on this medicine. You should also discuss the need for regular mammograms with your health care professional, and follow his or her guidelines for these tests. This medicine can make your body retain fluid, making your fingers, hands, or ankles swell. Your blood pressure can go up. Contact your doctor or health care professional if you feel you are retaining fluid. If you have any reason to think you are pregnant, stop taking this medicine right away and contact your doctor or health care professional. Smoking increases the risk of getting a blood clot or having a stroke while you are taking this medicine, especially if you are more than 55 years old. You are strongly advised not to smoke. If you wear contact lenses and notice visual changes, or if the lenses begin to feel uncomfortable, consult your eye doctor or health care professional. This medicine can increase the risk of developing a condition (endometrial hyperplasia) that may lead to cancer of the lining of the uterus. Taking progestins, another hormone drug, with this medicine lowers the risk of developing this condition. Therefore, if your uterus has not been removed (by a hysterectomy), your doctor may prescribe a progestin for you to take together with your estrogen. You should know, however, that taking estrogens with progestins may have additional health risks. You should discuss the use of estrogens and progestins with your health care professional to determine the benefits and risks for you. If you are going to have surgery, you may need to stop taking this medicine. Consult your health care professional for advice before you schedule the surgery. What side effects may I notice from  receiving this medicine? Side effects that you should report to your doctor or health care professional as soon as possible: -allergic reactions like skin rash, itching or hives, swelling of the face, lips, or tongue -breast tissue changes or discharge -changes in vision -chest pain -confusion, trouble speaking or understanding -dark urine -general ill feeling or flu-like symptoms -light-colored stools -nausea, vomiting -pain, swelling, warmth in the leg -right upper belly pain -severe headaches -shortness of breath -sudden numbness or weakness of the face, arm or leg -trouble walking, dizziness, loss of balance or coordination -unusual vaginal bleeding -yellowing of the eyes or skin Side effects that usually do not require medical attention (report to your doctor or health care professional if they continue or are bothersome): -hair loss -increased hunger or thirst -increased urination -symptoms of vaginal infection like itching, irritation or unusual discharge -unusually weak or tired This list may not describe all possible side effects. Call your doctor for medical advice about side effects. You may report side effects to FDA at 1-800-FDA-1088. Where should I keep my medicine? Keep out of the reach of children. Store at room temperature between 20 and 25 degrees C (68 and 77 degrees F). Keep container tightly closed. Protect from light. Throw away any unused medicine after the expiration date. NOTE: This sheet is a summary. It may not cover all possible information. If you have questions about this medicine, talk to your doctor, pharmacist,  or health care provider.    2016, Elsevier/Gold Standard. (2010-05-01 [id number]) Hormone Therapy At menopause, your body begins making less estrogen and progesterone hormones. This causes the body to stop having menstrual periods. This is because estrogen and progesterone hormones control your periods and menstrual cycle. A lack of estrogen may  cause symptoms such as:  Hot flushes (or hot flashes).  Vaginal dryness.  Dry skin.  Loss of sex drive.  Risk of bone loss (osteoporosis). When this happens, you may choose to take hormone therapy to get back the estrogen lost during menopause. When the hormone estrogen is given alone, it is usually referred to as ET (Estrogen Therapy). When the hormone progestin is combined with estrogen, it is generally called HT (Hormone Therapy). This was formerly known as hormone replacement therapy (HRT). Your caregiver can help you make a decision on what will be best for you. The decision to use HT seems to change often as new studies are done. Many studies do not agree on the benefits of hormone replacement therapy. LIKELY BENEFITS OF HT INCLUDE PROTECTION FROM:  Hot Flushes (also called hot flashes) - A hot flush is a sudden feeling of heat that spreads over the face and body. The skin may redden like a blush. It is connected with sweats and sleep disturbance. Women going through menopause may have hot flushes a few times a month or several times per day depending on the woman.  Osteoporosis (bone loss) - Estrogen helps guard against bone loss. After menopause, a woman's bones slowly lose calcium and become weak and brittle. As a result, bones are more likely to break. The hip, wrist, and spine are affected most often. Hormone therapy can help slow bone loss after menopause. Weight bearing exercise and taking calcium with vitamin D also can help prevent bone loss. There are also medications that your caregiver can prescribe that can help prevent osteoporosis.  Vaginal dryness - Loss of estrogen causes changes in the vagina. Its lining may become thin and dry. These changes can cause pain and bleeding during sexual intercourse. Dryness can also lead to infections. This can cause burning and itching. (Vaginal estrogen treatment can help relieve pain, itching, and dryness.)  Urinary tract infections are  more common after menopause because of lack of estrogen. Some women also develop urinary incontinence because of low estrogen levels in the vagina and bladder.  Possible other benefits of estrogen include a positive effect on mood and short-term memory in women. RISKS AND COMPLICATIONS  Using estrogen alone without progesterone causes the lining of the uterus to grow. This increases the risk of lining of the uterus (endometrial) cancer. Your caregiver should give another hormone called progestin if you have a uterus.  Women who take combined (estrogen and progestin) HT appear to have an increased risk of breast cancer. The risk appears to be small, but increases throughout the time that HT is taken.  Combined therapy also makes the breast tissue slightly denser which makes it harder to read mammograms (breast X-rays).  Combined, estrogen and progesterone therapy can be taken together every day, in which case there may be spotting of blood. HT therapy can be taken cyclically in which case you will have menstrual periods. Cyclically means HT is taken for a set amount of days, then not taken, then this process is repeated.  HT may increase the risk of stroke, heart attack, breast cancer and forming blood clots in your leg.  Transdermal estrogen (estrogen that is absorbed through  the skin with a patch or a cream) may have better results with:  Cholesterol.  Blood pressure.  Blood clots. Having the following conditions may indicate you should not have HT:  Endometrial cancer.  Liver disease.  Breast cancer.  Heart disease.  History of blood clots.  Stroke. TREATMENT   If you choose to take HT and have a uterus, usually estrogen and progestin are prescribed.  Your caregiver will help you decide the best way to take the medications.  Possible ways to take estrogen include:  Pills.  Patches.  Gels.  Sprays.  Vaginal estrogen cream, rings and tablets.  It is best to take  the lowest dose possible that will help your symptoms and take them for the shortest period of time that you can.  Hormone therapy can help relieve some of the problems (symptoms) that affect women at menopause. Before making a decision about HT, talk to your caregiver about what is best for you. Be well informed and comfortable with your decisions. HOME CARE INSTRUCTIONS   Follow your caregivers advice when taking the medications.  A Pap test is done to screen for cervical cancer.  The first Pap test should be done at age 31.  Between ages 54 and 42, Pap tests are repeated every 2 years.  Beginning at age 102, you are advised to have a Pap test every 3 years as long as the past 3 Pap tests have been normal.  Some women have medical problems that increase the chance of getting cervical cancer. Talk to your caregiver about these problems. It is especially important to talk to your caregiver if a new problem develops soon after your last Pap test. In these cases, your caregiver may recommend more frequent screening and Pap tests.  The above recommendations are the same for women who have or have not gotten the vaccine for HPV (human papillomavirus).  If you had a hysterectomy for a problem that was not a cancer or a condition that could lead to cancer, then you no longer need Pap tests. However, even if you no longer need a Pap test, a regular exam is a good idea to make sure no other problems are starting.  If you are between ages 60 and 72, and you have had normal Pap tests going back 10 years, you no longer need Pap tests. However, even if you no longer need a Pap test, a regular exam is a good idea to make sure no other problems are starting.  If you have had past treatment for cervical cancer or a condition that could lead to cancer, you need Pap tests and screening for cancer for at least 20 years after your treatment.  If Pap tests have been discontinued, risk factors (such as a new  sexual partner)need to be re-assessed to determine if screening should be resumed.  Some women may need screenings more often if they are at high risk for cervical cancer.  Get mammograms done as per the advice of your caregiver. SEEK IMMEDIATE MEDICAL CARE IF:  You develop abnormal vaginal bleeding.  You have pain or swelling in your legs, shortness of breath, or chest pain.  You develop dizziness or headaches.  You have lumps or changes in your breasts or armpits.  You have slurred speech.  You develop weakness or numbness of your arms or legs.  You have pain, burning, or bleeding when urinating.  You develop abdominal pain.   This information is not intended to replace advice  given to you by your health care provider. Make sure you discuss any questions you have with your health care provider.   Document Released: 10/26/2002 Document Revised: 06/13/2014 Document Reviewed: 07/31/2014 Elsevier Interactive Patient Education 2016 [place]. Menopause Menopause is the normal time of life when menstrual periods stop completely. Menopause is complete when you have missed 12 consecutive menstrual periods. It usually occurs between the ages of 79 years and 52 years. Very rarely does a woman develop menopause before the age of 38 years. At menopause, your ovaries stop producing the female hormones estrogen and progesterone. This can cause undesirable symptoms and also affect your health. Sometimes the symptoms may occur 4-5 years before the menopause begins. There is no relationship between menopause and:  Oral contraceptives.  Number of children you had.  Race.  The age your menstrual periods started (menarche). Heavy smokers and very thin women may develop menopause earlier in life. CAUSES  The ovaries stop producing the female hormones estrogen and progesterone.  Other causes include:  Surgery to remove both ovaries.  The ovaries stop functioning for no known  reason.  Tumors of the pituitary gland in the brain.  Medical disease that affects the ovaries and hormone production.  Radiation treatment to the abdomen or pelvis.  Chemotherapy that affects the ovaries. SYMPTOMS   Hot flashes.  Night sweats.  Decrease in sex drive.  Vaginal dryness and thinning of the vagina causing painful intercourse.  Dryness of the skin and developing wrinkles.  Headaches.  Tiredness.  Irritability.  Memory problems.  Weight gain.  Bladder infections.  Hair growth of the face and chest.  Infertility. More serious symptoms include:  Loss of bone (osteoporosis) causing breaks (fractures).  Depression.  Hardening and narrowing of the arteries (atherosclerosis) causing heart attacks and strokes. DIAGNOSIS   When the menstrual periods have stopped for 12 straight months.  Physical exam.  Hormone studies of the blood. TREATMENT  There are many treatment choices and nearly as many questions about them. The decisions to treat or not to treat menopausal changes is an individual choice made with your health care provider. Your health care provider can discuss the treatments with you. Together, you can decide which treatment will work best for you. Your treatment choices may include:   Hormone therapy (estrogen and progesterone).  Non-hormonal medicines.  Treating the individual symptoms with medicine (for example antidepressants for depression).  Herbal medicines that may help specific symptoms.  Counseling by a psychiatrist or psychologist.  Group therapy.  Lifestyle changes including:  Eating healthy.  Regular exercise.  Limiting caffeine and alcohol.  Stress management and meditation.  No treatment. HOME CARE INSTRUCTIONS   Take the medicine your health care provider gives you as directed.  Get plenty of sleep and rest.  Exercise regularly.  Eat a diet that contains calcium (good for the bones) and soy products (acts  like estrogen hormone).  Avoid alcoholic beverages.  Do not smoke.  If you have hot flashes, dress in layers.  Take supplements, calcium, and vitamin D to strengthen bones.  You can use over-the-counter lubricants or moisturizers for vaginal dryness.  Group therapy is sometimes very helpful.  Acupuncture may be helpful in some cases. SEEK MEDICAL CARE IF:   You are not sure you are in menopause.  You are having menopausal symptoms and need advice and treatment.  You are still having menstrual periods after age 43 years.  You have pain with intercourse.  Menopause is complete (no menstrual period  for 12 months) and you develop vaginal bleeding.  You need a referral to a specialist (gynecologist, psychiatrist, or psychologist) for treatment. SEEK IMMEDIATE MEDICAL CARE IF:   You have severe depression.  You have excessive vaginal bleeding.  You fell and think you have a broken bone.  You have pain when you urinate.  You develop leg or chest pain.  You have a fast pounding heart beat (palpitations).  You have severe headaches.  You develop vision problems.  You feel a lump in your breast.  You have abdominal pain or severe indigestion.   This information is not intended to replace advice given to you by your health care provider. Make sure you discuss any questions you have with your health care provider.   Document Released: 04/19/2003 Document Revised: 09/29/2012 Document Reviewed: 08/26/2012 Elsevier Interactive Patient Education [employer].

## 2015-04-04 LAB — CYTOLOGY - PAP

## 2015-04-05 ENCOUNTER — Other Ambulatory Visit: Payer: Self-pay | Admitting: Gynecology

## 2015-04-05 MED ORDER — NITROFURANTOIN MONOHYD MACRO 100 MG PO CAPS: 100.0000 mg | ORAL_CAPSULE | Freq: Two times a day (BID) | ORAL | Status: DC

## 2015-04-06 LAB — URINE CULTURE

## 2015-04-12 ENCOUNTER — Encounter: Payer: Self-pay | Admitting: Gynecology

## 2015-06-05 ENCOUNTER — Other Ambulatory Visit: Payer: Self-pay | Admitting: Anesthesiology

## 2015-06-05 DIAGNOSIS — Z1211 Encounter for screening for malignant neoplasm of colon: Secondary | ICD-10-CM

## 2015-06-06 ENCOUNTER — Encounter: Payer: Self-pay | Admitting: Gynecology

## 2016-03-21 ENCOUNTER — Encounter: Payer: Self-pay | Admitting: Gynecology

## 2016-04-21 ENCOUNTER — Other Ambulatory Visit: Payer: Self-pay | Admitting: Gynecology

## 2016-05-05 ENCOUNTER — Ambulatory Visit (INDEPENDENT_AMBULATORY_CARE_PROVIDER_SITE_OTHER): Payer: 59 | Admitting: Gynecology

## 2016-05-05 ENCOUNTER — Encounter: Payer: Self-pay | Admitting: Gynecology

## 2016-05-05 VITALS — BP 136/84 | Ht 65.0 in | Wt 234.0 lb

## 2016-05-05 DIAGNOSIS — Z780 Asymptomatic menopausal state: Secondary | ICD-10-CM

## 2016-05-05 DIAGNOSIS — Z01419 Encounter for gynecological examination (general) (routine) without abnormal findings: Secondary | ICD-10-CM | POA: Diagnosis not present

## 2016-05-05 DIAGNOSIS — Z79890 Hormone replacement therapy (postmenopausal): Secondary | ICD-10-CM

## 2016-05-05 MED ORDER — ESTRADIOL 1 MG PO TABS: 1.0000 mg | ORAL_TABLET | Freq: Every day | ORAL | 4 refills | Status: DC

## 2016-05-05 MED ORDER — PROGESTERONE MICRONIZED 100 MG PO CAPS: ORAL_CAPSULE | ORAL | 4 refills | Status: DC

## 2016-05-05 NOTE — Progress Notes (Signed)
Allison Frazier [birth year] [id number]   History:    56 y.o.  for annual gyn exam with no complaints today. This is patient's second year on hormone replacement therapy. She is currently on Estrace 1 mg daily with the addition of Prometrium 200 mg for first 12 days of the month. She reports no vaginal bleeding. She is in a weight reduction plan with [facility] and had been weighing 261 pounds and is currently down to 234 pounds. They have been doing all her blood work. Patient had a normal colonoscopy in 2013. She has never had any abnormal Pap smears. She has not had a bone density study as of yet. Many years ago patient had endometrial ablation secondary to menorrhagia.  Past medical history,surgical history, family history and social history were all reviewed and documented in the EPIC chart.  Gynecologic History Patient's last menstrual period was 12/29/2012. Contraception: tubal ligation Last Pap: 2017. Results were: normal Last mammogram: 2018. Results were: normal  Obstetric History OB History  Gravida Para Term Preterm AB Living  3 3 3     3   SAB TAB Ectopic Multiple Live Births          3    # Outcome Date GA Lbr Len/2nd Weight Sex Delivery Anes PTL Lv  3 Term     F CS-Unspec  N LIV  2 Term     M CS-Unspec  N LIV  1 Term     F Vag-Spont  N LIV       ROS: A ROS was performed and pertinent positives and negatives are included in the history.  GENERAL: No fevers or chills. HEENT: No change in vision, no earache, sore throat or sinus congestion. NECK: No pain or stiffness. CARDIOVASCULAR: No chest pain or pressure. No palpitations. PULMONARY: No shortness of breath, cough or wheeze. GASTROINTESTINAL: No abdominal pain, nausea, vomiting or diarrhea, melena or bright red blood per rectum. GENITOURINARY: No urinary frequency, urgency, hesitancy or dysuria. MUSCULOSKELETAL: No joint or muscle pain, no back pain, no recent trauma. DERMATOLOGIC: No rash, no itching, no  lesions. ENDOCRINE: No polyuria, polydipsia, no heat or cold intolerance. No recent change in weight. HEMATOLOGICAL: No anemia or easy bruising or bleeding. NEUROLOGIC: No headache, seizures, numbness, tingling or weakness. PSYCHIATRIC: No depression, no loss of interest in normal activity or change in sleep pattern.     Exam: chaperone present  BP 136/84   Ht 5\' 5"  (1.651 m)   Wt 234 lb (106.1 kg)   LMP 12/29/2012   BMI 38.94 kg/m   Body mass index is 38.94 kg/m.  General appearance : Well developed well nourished female. No acute distress HEENT: Eyes: no retinal hemorrhage or exudates,  Neck supple, trachea midline, no carotid bruits, no thyroidmegaly Lungs: Clear to auscultation, no rhonchi or wheezes, or rib retractions  Heart: Regular rate and rhythm, no murmurs or gallops Breast:Examined in sitting and supine position were symmetrical in appearance, no palpable masses or tenderness,  no skin retraction, no nipple inversion, no nipple discharge, no skin discoloration, no axillary or supraclavicular lymphadenopathy Abdomen: no palpable masses or tenderness, no rebound or guarding Extremities: no edema or skin discoloration or tenderness  Pelvic:  Bartholin, Urethra, Skene Glands: Within normal limits             Vagina: No gross lesions or discharge  Cervix: No gross lesions or discharge  Uterus  anteverted, normal size, shape and consistency, non-tender and mobile  Adnexa  Without masses or tenderness  Anus and perineum  normal   Rectovaginal  normal sphincter tone without palpated masses or tenderness             Hemoccult cards provided     Assessment/Plan:  56 y.o. female for annual exam doing well on second year formal replacement therapy. Prescription refill provided. PCP has done her blood work. Her flu vaccine is up-to-date. Pap smear not indicated this year. Patient to schedule bone density study here in the office in next few weeks. She was provided with fecal  Hemoccult cards for testing. We discussed importance of calcium vitamin D and weightbearing exercises for osteoporosis prevention.   Terrance Mass MD, 8:54 AM 05/05/2016

## 2016-05-05 NOTE — Patient Instructions (Signed)
Bone Densitometry Bone densitometry is an imaging test that uses a special X-ray to measure the amount of calcium and other minerals in your bones (bone density). This test is also known as a bone mineral density test or dual-energy X-ray absorptiometry (DXA). The test can measure bone density at your hip and your spine. It is similar to having a regular X-ray. You may have this test to:  Diagnose a condition that causes weak or thin bones (osteoporosis).  Predict your risk of a broken bone (fracture).  Determine how well osteoporosis treatment is working. Tell a health care provider about:  Any allergies you have.  All medicines you are taking, including vitamins, herbs, eye drops, creams, and over-the-counter medicines.  Any problems you or family members have had with anesthetic medicines.  Any blood disorders you have.  Any surgeries you have had.  Any medical conditions you have.  Possibility of pregnancy.  Any other medical test you had within the previous 14 days that used contrast material. What are the risks? Generally, this is a safe procedure. However, problems can occur and may include the following:  This test exposes you to a very small amount of radiation.  The risks of radiation exposure may be greater to unborn children. What happens before the procedure?  Do not take any calcium supplements for 24 hours before having the test. You can otherwise eat and drink what you usually do.  Take off all metal jewelry, eyeglasses, dental appliances, and any other metal objects. What happens during the procedure?  You may lie on an exam table. There will be an X-ray generator below you and an imaging device above you.  Other devices, such as boxes or braces, may be used to position your body properly for the scan.  You will need to lie still while the machine slowly scans your body.  The images will show up on a computer monitor. What happens after the  procedure? You may need more testing at a later time. This information is not intended to replace advice given to you by your health care provider. Make sure you discuss any questions you have with your health care provider. Document Released: 02/19/2004 Document Revised: 07/05/2015 Document Reviewed: 07/07/2013 Elsevier Interactive Patient Education  2017 [employer]vier Inc.  

## 2016-05-08 ENCOUNTER — Telehealth: Payer: Self-pay | Admitting: *Deleted

## 2016-05-08 MED ORDER — TRIAMCINOLONE ACETONIDE 0.1 % EX CREA: 1.0000 "application " | TOPICAL_CREAM | Freq: Two times a day (BID) | CUTANEOUS | 3 refills | Status: DC

## 2016-05-08 MED ORDER — NYSTATIN-TRIAMCINOLONE 100000-0.1 UNIT/GM-% EX OINT: 1.0000 "application " | TOPICAL_OINTMENT | Freq: Two times a day (BID) | CUTANEOUS | 3 refills | Status: DC

## 2016-05-08 MED ORDER — NYSTATIN 100000 UNIT/GM EX CREA: 1.0000 "application " | TOPICAL_CREAM | Freq: Two times a day (BID) | CUTANEOUS | 3 refills | Status: DC

## 2016-05-08 NOTE — Telephone Encounter (Signed)
Pt informed, Rx sent. 

## 2016-05-08 NOTE — Telephone Encounter (Signed)
Pt was here for annual on 05/05/16 mentioned a rash on lower abdomen ( from where she ties her scrub pants) states a cream was going to prescribed a cream to apply. No Rx was sent. Please advise

## 2016-05-08 NOTE — Telephone Encounter (Signed)
Rx is not covered with combination, will need to separate Rx. Nystatin and triamcinolone cream sent.

## 2016-05-08 NOTE — Telephone Encounter (Signed)
Sorry I forgot. Please call in Mytrex Cream apply BID-TID 7-10 Days. 1 tube with 3refils

## 2016-05-29 ENCOUNTER — Other Ambulatory Visit: Payer: Self-pay | Admitting: Gynecology

## 2016-05-29 ENCOUNTER — Ambulatory Visit (INDEPENDENT_AMBULATORY_CARE_PROVIDER_SITE_OTHER): Payer: 59

## 2016-05-29 DIAGNOSIS — Z79890 Hormone replacement therapy (postmenopausal): Secondary | ICD-10-CM | POA: Diagnosis not present

## 2016-05-29 DIAGNOSIS — Z13820 Encounter for screening for osteoporosis: Secondary | ICD-10-CM | POA: Diagnosis not present

## 2016-05-29 DIAGNOSIS — M8588 Other specified disorders of bone density and structure, other site: Secondary | ICD-10-CM

## 2016-05-29 DIAGNOSIS — Z780 Asymptomatic menopausal state: Secondary | ICD-10-CM

## 2016-06-25 ENCOUNTER — Encounter: Payer: Self-pay | Admitting: Gynecology

## 2016-07-21 ENCOUNTER — Telehealth: Payer: Self-pay

## 2016-07-21 NOTE — Telephone Encounter (Signed)
Patient called stating that she was not given any refills on her estradiol this year. Usually gets a years Rx. A 90 days supply w 4 refills was sent. I called her [facility] and they received that Rx and have it ready for her. I called her back and left message in voice mail and let her know this.

## 2016-07-22 ENCOUNTER — Other Ambulatory Visit: Payer: Self-pay | Admitting: Anesthesiology

## 2016-07-22 DIAGNOSIS — Z1211 Encounter for screening for malignant neoplasm of colon: Secondary | ICD-10-CM

## 2016-12-12 ENCOUNTER — Ambulatory Visit: Payer: Self-pay | Admitting: Family

## 2016-12-12 VITALS — BP 130/90 | HR 81 | Temp 98.0°F

## 2016-12-12 DIAGNOSIS — R300 Dysuria: Secondary | ICD-10-CM

## 2016-12-12 LAB — POCT URINALYSIS DIPSTICK
Bilirubin, UA: NEGATIVE
Blood, UA: NEGATIVE
Glucose, UA: NEGATIVE
Ketones, UA: NEGATIVE
LEUKOCYTES UA: NEGATIVE
Nitrite, UA: NEGATIVE
PH UA: 5.5 (ref 5.0–8.0)
PROTEIN UA: NEGATIVE
SPEC GRAV UA: 1.025 (ref 1.010–1.025)
UROBILINOGEN UA: 0.2 U/dL

## 2016-12-12 NOTE — Progress Notes (Signed)
S/: In today to rule out urinary tract infection due to urinary frequency without any other symptoms. O/: Vital signs stable alert pleasant no acute distress UA dip completely negative exam normal  A/: Urinary frequency  P/: Monitor for worsening of symptoms. hydration encouraged and avoidance of carbonated and caffeinated beverages. Follow-up when necessary not improving.

## 2017-03-24 DIAGNOSIS — Z1231 Encounter for screening mammogram for malignant neoplasm of breast: Secondary | ICD-10-CM | POA: Diagnosis not present

## 2017-03-26 DIAGNOSIS — R928 Other abnormal and inconclusive findings on diagnostic imaging of breast: Secondary | ICD-10-CM | POA: Diagnosis not present

## 2017-05-06 ENCOUNTER — Encounter: Payer: 59 | Admitting: Obstetrics & Gynecology

## 2017-05-28 ENCOUNTER — Other Ambulatory Visit: Payer: Self-pay

## 2017-06-12 ENCOUNTER — Telehealth: Payer: Self-pay | Admitting: *Deleted

## 2017-06-12 DIAGNOSIS — Z79890 Hormone replacement therapy (postmenopausal): Secondary | ICD-10-CM

## 2017-06-12 MED ORDER — PROGESTERONE MICRONIZED 100 MG PO CAPS: ORAL_CAPSULE | ORAL | 4 refills | Status: DC

## 2017-06-12 NOTE — Telephone Encounter (Signed)
Patient has annual exam scheduled on 08/19/17, needs refill on progesterone 100 mg day 1-12 monthly. Rx sent.

## 2017-07-12 ENCOUNTER — Encounter (HOSPITAL_COMMUNITY): Payer: Self-pay | Admitting: *Deleted

## 2017-07-12 ENCOUNTER — Other Ambulatory Visit: Payer: Self-pay

## 2017-07-12 ENCOUNTER — Emergency Department (HOSPITAL_COMMUNITY): Payer: Commercial Managed Care - PPO

## 2017-07-12 ENCOUNTER — Emergency Department (HOSPITAL_COMMUNITY)
Admission: EM | Admit: 2017-07-12 | Discharge: 2017-07-12 | Disposition: A | Discharge status: Home / Self Care | Payer: Commercial Managed Care - PPO | Attending: Emergency Medicine | Admitting: Emergency Medicine

## 2017-07-12 DIAGNOSIS — E7800 Pure hypercholesterolemia, unspecified: Secondary | ICD-10-CM | POA: Insufficient documentation

## 2017-07-12 DIAGNOSIS — Z79899 Other long term (current) drug therapy: Secondary | ICD-10-CM | POA: Diagnosis not present

## 2017-07-12 DIAGNOSIS — Z7982 Long term (current) use of aspirin: Secondary | ICD-10-CM | POA: Diagnosis not present

## 2017-07-12 DIAGNOSIS — R1084 Generalized abdominal pain: Secondary | ICD-10-CM | POA: Diagnosis present

## 2017-07-12 DIAGNOSIS — R1032 Left lower quadrant pain: Secondary | ICD-10-CM | POA: Diagnosis not present

## 2017-07-12 DIAGNOSIS — N201 Calculus of ureter: Secondary | ICD-10-CM | POA: Diagnosis not present

## 2017-07-12 DIAGNOSIS — N132 Hydronephrosis with renal and ureteral calculous obstruction: Secondary | ICD-10-CM | POA: Diagnosis not present

## 2017-07-12 LAB — BASIC METABOLIC PANEL
Anion gap: 11 (ref 5–15)
BUN: 22 mg/dL — ABNORMAL HIGH (ref 6–20)
CO2: 23 mmol/L (ref 22–32)
Calcium: 11 mg/dL — ABNORMAL HIGH (ref 8.9–10.3)
Chloride: 106 mmol/L (ref 101–111)
Creatinine, Ser: 0.88 mg/dL (ref 0.44–1.00)
GFR calc Af Amer: >60 mL/min (ref >60)
GFR calc non Af Amer: >60 mL/min (ref >60)
Glucose, Bld: 131 mg/dL — ABNORMAL HIGH (ref 65–99)
Potassium: 3.8 mmol/L (ref 3.5–5.1)
Sodium: 140 mmol/L (ref 135–145)

## 2017-07-12 LAB — CBC WITH DIFFERENTIAL/PLATELET
Basophils Absolute: 0 10*3/uL (ref 0.0–0.1)
Basophils Relative: 0 %
Eosinophils Absolute: 0.1 10*3/uL (ref 0.0–0.7)
Eosinophils Relative: 2 %
HCT: 46.4 % — ABNORMAL HIGH (ref 36.0–46.0)
Hemoglobin: 15.7 g/dL — ABNORMAL HIGH (ref 12.0–15.0)
Lymphocytes Relative: 36 %
Lymphs Abs: 2.6 10*3/uL (ref 0.7–4.0)
MCH: 29.2 pg (ref 26.0–34.0)
MCHC: 33.8 g/dL (ref 30.0–36.0)
MCV: 86.4 fL (ref 78.0–100.0)
Monocytes Absolute: 0.5 10*3/uL (ref 0.1–1.0)
Monocytes Relative: 7 %
Neutro Abs: 4 10*3/uL (ref 1.7–7.7)
Neutrophils Relative %: 55 %
Platelets: 267 10*3/uL (ref 150–400)
RBC: 5.37 MIL/uL — ABNORMAL HIGH (ref 3.87–5.11)
RDW: 13.5 % (ref 11.5–15.5)
WBC: 7.3 10*3/uL (ref 4.0–10.5)

## 2017-07-12 LAB — URINALYSIS, ROUTINE W REFLEX MICROSCOPIC
Bacteria, UA: NONE SEEN
Bilirubin Urine: NEGATIVE
Glucose, UA: NEGATIVE mg/dL
Ketones, ur: NEGATIVE mg/dL
Leukocytes, UA: NEGATIVE
Nitrite: NEGATIVE
Protein, ur: NEGATIVE mg/dL
Specific Gravity, Urine: 1.015 (ref 1.005–1.030)
pH: 5 (ref 5.0–8.0)

## 2017-07-12 MED ORDER — OXYCODONE-ACETAMINOPHEN 5-325 MG PO TABS: 1.0000 | ORAL_TABLET | ORAL | 0 refills | Status: DC | PRN

## 2017-07-12 MED ORDER — PROMETHAZINE HCL 25 MG/ML IJ SOLN
12.5000 mg | Freq: Once | INTRAMUSCULAR | Status: AC
  Administered 2017-07-12: 12.5 mg via INTRAVENOUS
  Filled 2017-07-12: qty 1

## 2017-07-12 MED ORDER — HYDROMORPHONE HCL 1 MG/ML IJ SOLN
1.0000 mg | Freq: Once | INTRAMUSCULAR | Status: AC
  Administered 2017-07-12: 1 mg via INTRAVENOUS
  Filled 2017-07-12: qty 1

## 2017-07-12 MED ORDER — ONDANSETRON HCL 4 MG PO TABS: 4.0000 mg | ORAL_TABLET | Freq: Four times a day (QID) | ORAL | 0 refills | Status: DC

## 2017-07-12 MED ORDER — SODIUM CHLORIDE 0.9 % IV BOLUS
1000.0000 mL | Freq: Once | INTRAVENOUS | Status: AC
  Administered 2017-07-12: 1000 mL via INTRAVENOUS

## 2017-07-12 MED ORDER — MORPHINE SULFATE (PF) 4 MG/ML IV SOLN
8.0000 mg | Freq: Once | INTRAVENOUS | Status: AC
  Administered 2017-07-12: 8 mg via INTRAVENOUS
  Filled 2017-07-12: qty 2

## 2017-07-12 MED ORDER — OXYCODONE-ACETAMINOPHEN 5-325 MG PO TABS: 2.0000 | ORAL_TABLET | ORAL | 0 refills | Status: DC | PRN

## 2017-07-12 MED ORDER — ONDANSETRON HCL 4 MG/2ML IJ SOLN
4.0000 mg | Freq: Once | INTRAMUSCULAR | Status: AC
  Administered 2017-07-12: 4 mg via INTRAVENOUS
  Filled 2017-07-12: qty 2

## 2017-07-12 MED ORDER — ONDANSETRON HCL 4 MG PO TABS: 4.0000 mg | ORAL_TABLET | Freq: Three times a day (TID) | ORAL | 0 refills | Status: DC | PRN

## 2017-07-12 MED ORDER — KETOROLAC TROMETHAMINE 30 MG/ML IJ SOLN
15.0000 mg | Freq: Once | INTRAMUSCULAR | Status: AC
  Administered 2017-07-12: 15 mg via INTRAVENOUS
  Filled 2017-07-12: qty 1

## 2017-07-12 NOTE — ED Triage Notes (Signed)
Pt denies hx of kidney stones

## 2017-07-12 NOTE — ED Triage Notes (Signed)
Pt with severe left sided abd pain with nausea due to pain, denies diarrhea or emesis.  LBM today and normal per pt.

## 2017-07-12 NOTE — ED Provider Notes (Signed)
[facility] EMERGENCY DEPARTMENT Provider Note   CSN: [id number] Arrival date & time: 07/12/17  1532     History   Chief Complaint Chief Complaint  Patient presents with  . Abdominal Pain    HPI ERCEL NORMOYLE is a 57 y.o. female.  HPI   57 year old female with left abdominal left flank pain.  She initially noticed it while at rest at her granddaughter's recital yesterday.  The pain resolved after couple hours.  Today she began with pain in the same area but much more severe and now unrelenting.  Associate with nausea.  No urinary complaints.  No fevers or chills.  Denies any past history of kidney stones or similar type symptoms.  She has not tried taking anything.  Past Medical History:  Diagnosis Date  . Chondromalacia of both patellae   . High cholesterol     Patient Active Problem List   Diagnosis Date Noted  . Near syncope 02/17/2013  . Chest pain 07/11/2012  . Morbid obesity ([place]) 07/11/2012  . Overweight(278.02) 06/23/2012  . Fibroid uterus 05/05/2011  . High cholesterol     Past Surgical History:  Procedure Laterality Date  . CESAREAN SECTION  1996  . CESAREAN SECTION  1998  . ENDOMETRIAL ABLATION  04/04/07   HER OPTION  . LASIX     REFRACTIVE SURGERY-  RIGHT EYE  . TUBAL LIGATION  1998     OB History    Gravida  3   Para  3   Term  3   Preterm      AB      Living  3     SAB      TAB      Ectopic      Multiple      Live Births  3            Home Medications    Prior to Admission medications   Medication Sig Start Date End Date Taking? Authorizing Provider  aspirin EC 81 MG tablet Take 81 mg by mouth daily.    Yes [provider]  doxylamine, Sleep, (UNISOM) 25 MG tablet Take 25 mg by mouth at bedtime as needed for sleep.   Yes [provider]  estradiol (ESTRACE) 1 MG tablet Take 1 tablet (1 mg total) by mouth daily. 05/05/16  Yes Terrance Mass, MD  Phenyleph-Doxylamine-DM-APAP (SINEX SEVERE+ VAPOCOOL  PO) Place 1 application into the nose as needed.   Yes [provider]  progesterone (PROMETRIUM) 100 MG capsule Take one tablet first 12 days of the month 06/12/17  Yes Princess Bruins, MD  cetirizine (ZYRTEC) 10 MG tablet Take 10 mg by mouth daily. Reported on 04/02/2015    [provider]  nystatin cream (MYCOSTATIN) Apply 1 application topically 2 (two) times daily. 7-10 days Patient not taking: Reported on 12/12/2016 05/08/16   Terrance Mass, MD    Family History Family History  Problem Relation Age of Onset  . Heart disease Father   . Cancer Father        Prostate    Social History Social History   Tobacco Use  . Smoking status: Never Smoker  . Smokeless tobacco: Never Used  Substance Use Topics  . Alcohol use: Yes    Alcohol/week: 0.0 oz    Comment: social  . Drug use: No     Allergies   Simvastatin   Review of Systems Review of Systems  All systems reviewed and negative, other than  as noted in HPI.  Physical Exam Updated Vital Signs BP (!) 157/143 (BP Location: Left Arm)   Pulse 89   Temp 98.5 F (36.9 C) (Temporal)   Resp (!) 24   Ht 5\' 5"  (1.651 m)   Wt 106.6 kg (235 lb)   LMP 12/29/2012   SpO2 99%   BMI 39.11 kg/m   Physical Exam  Constitutional: She appears well-developed and well-nourished. No distress.  I entered the room, patient was leaning up against the counter.  Appeared very uncomfortable.  HENT:  Head: Normocephalic and atraumatic.  Eyes: Conjunctivae are normal. Right eye exhibits no discharge. Left eye exhibits no discharge.  Neck: Neck supple.  Cardiovascular: Normal rate, regular rhythm and normal heart sounds. Exam reveals no gallop and no friction rub.  No murmur heard. Pulmonary/Chest: Effort normal and breath sounds normal. No respiratory distress.  Abdominal: Soft. She exhibits no distension. There is no tenderness.  Tenderness suprapubically into the left lower quadrant and left flank.  Musculoskeletal:  She exhibits no edema or tenderness.  Neurological: She is alert.  Skin: Skin is warm and dry.  Psychiatric: She has a normal mood and affect. Her behavior is normal. Thought content normal.  Nursing note and vitals reviewed.    ED Treatments / Results  Labs (all labs ordered are listed, but only abnormal results are displayed) Labs Reviewed  URINALYSIS, ROUTINE W REFLEX MICROSCOPIC - Abnormal; Notable for the following components:      Result Value   APPearance CLOUDY (*)    Hgb urine dipstick MODERATE (*)    All other components within normal limits  CBC WITH DIFFERENTIAL/PLATELET - Abnormal; Notable for the following components:   RBC 5.37 (*)    Hemoglobin 15.7 (*)    HCT 46.4 (*)    All other components within normal limits  BASIC METABOLIC PANEL - Abnormal; Notable for the following components:   Glucose, Bld 131 (*)    BUN 22 (*)    Calcium 11.0 (*)    All other components within normal limits    EKG None  Radiology Ct Renal Stone Study  Result Date: 07/12/2017 CLINICAL DATA:  Left-sided abdominal pain EXAM: CT ABDOMEN AND PELVIS WITHOUT CONTRAST TECHNIQUE: Multidetector CT imaging of the abdomen and pelvis was performed following the standard protocol without IV contrast. COMPARISON:  None. FINDINGS: Lower chest: No acute abnormality. Hepatobiliary: Liver is within normal limits. The gallbladder is well distended with a single large lamellated gallstone. No obstructive changes are seen. Pancreas: Unremarkable. No pancreatic ductal dilatation or surrounding inflammatory changes. Spleen: Normal in size without focal abnormality. Adrenals/Urinary Tract: 1 cm lesion in the right adrenal gland is noted likely representing an adenoma. The left adrenal gland is within normal limits. Kidneys are well visualized bilaterally. Mild hydronephrosis and hydroureter is noted on the left secondary to a tiny left UVJ stone measuring 2 mm. No renal calculi are seen. The bladder is  decompressed. Stomach/Bowel: Stomach is within normal limits. Appendix appears normal. No evidence of bowel wall thickening, distention, or inflammatory changes. Vascular/Lymphatic: Aortic atherosclerosis. No enlarged abdominal or pelvic lymph nodes. Reproductive: Uterine fibroids are noted. No other focal abnormality is seen. Other: No abdominal wall hernia or abnormality. No abdominopelvic ascites. Musculoskeletal: No acute or significant osseous findings. IMPRESSION: Tiny left distal ureteral stone with hydronephrosis. Right adrenal lesion likely representing a small adenoma. Cholelithiasis without complicating factors. Uterine fibroid change. Electronically Signed   By: Inez Catalina M.D.   On: 07/12/2017 17:15  Procedures Procedures (including critical care time)  Medications Ordered in ED Medications  HYDROmorphone (DILAUDID) injection 1 mg (1 mg Intravenous Given 07/12/17 1622)  ketorolac (TORADOL) 30 MG/ML injection 15 mg (15 mg Intravenous Given 07/12/17 1620)  sodium chloride 0.9 % bolus 1,000 mL (1,000 mLs Intravenous New Bag/Given 07/12/17 1620)  ondansetron (ZOFRAN) injection 4 mg (4 mg Intravenous Given 07/12/17 1621)     Initial Impression / Assessment and Plan / ED Course  I have reviewed the triage vital signs and the nursing notes.  Pertinent labs & imaging results that were available during my care of the patient were reviewed by me and considered in my medical decision making (see chart for details).  Clinical Course as of Jul 12 1956  Sun Jul 12, 2017  1945 CT does show small distal left ureteral stone.  Plan expectant management.  She should be able to pass it at the size.  Having some difficulty controlling her symptoms in the emergency room though.  Will re-dose medications and reassess.   [SK]    Clinical Course User Index [SK] Virgel Manifold, MD    57 year old female with symptoms very consistent with ureteral colic.  Will place an IV.  Symptom medic treatment.  Will  check basic labs and urinalysis.  No past history of ureteral stones.  Will CT.  Final Clinical Impressions(s) / ED Diagnoses   Final diagnoses:  Left ureteral stone    ED Discharge Orders    None       Virgel Manifold, MD 07/15/17 1415

## 2017-07-13 MED FILL — Ondansetron HCl Tab 4 MG: ORAL | Qty: 4 | Status: AC

## 2017-07-13 MED FILL — Oxycodone w/ Acetaminophen Tab 5-325 MG: ORAL | Qty: 6 | Status: AC

## 2017-07-17 ENCOUNTER — Other Ambulatory Visit: Payer: Self-pay

## 2017-07-17 NOTE — Telephone Encounter (Signed)
Former patient of Dr. Durenda Guthrie.  Last CE was 05/05/16 and requesting 90 days supply. Has CE scheduled with you on 08/19/17.

## 2017-07-18 MED ORDER — ESTRADIOL 1 MG PO TABS: 1.0000 mg | ORAL_TABLET | Freq: Every day | ORAL | 0 refills | Status: DC

## 2017-08-19 ENCOUNTER — Encounter: Payer: Self-pay | Admitting: Obstetrics & Gynecology

## 2017-09-07 ENCOUNTER — Ambulatory Visit: Payer: Commercial Managed Care - PPO | Admitting: Obstetrics & Gynecology

## 2017-09-07 ENCOUNTER — Encounter: Payer: Self-pay | Admitting: Obstetrics & Gynecology

## 2017-09-07 VITALS — BP 128/84 | Ht 64.75 in | Wt 251.0 lb

## 2017-09-07 DIAGNOSIS — Z79890 Hormone replacement therapy (postmenopausal): Secondary | ICD-10-CM | POA: Diagnosis not present

## 2017-09-07 DIAGNOSIS — Z01419 Encounter for gynecological examination (general) (routine) without abnormal findings: Secondary | ICD-10-CM | POA: Diagnosis not present

## 2017-09-07 DIAGNOSIS — M8589 Other specified disorders of bone density and structure, multiple sites: Secondary | ICD-10-CM

## 2017-09-07 DIAGNOSIS — Z6841 Body Mass Index (BMI) 40.0 and over, adult: Secondary | ICD-10-CM

## 2017-09-07 DIAGNOSIS — E66813 Obesity, class 3: Secondary | ICD-10-CM

## 2017-09-07 MED ORDER — PROGESTERONE MICRONIZED 100 MG PO CAPS: ORAL_CAPSULE | ORAL | 4 refills | Status: DC

## 2017-09-07 MED ORDER — ESTRADIOL 1 MG PO TABS: 1.0000 mg | ORAL_TABLET | Freq: Every day | ORAL | 4 refills | Status: DC

## 2017-09-07 NOTE — Progress Notes (Signed)
Allison Frazier September 18, 1960 [id number]   History:    57 y.o. G3P3L3  Married.  Children 21, 97, 57 yo.  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  Hot flushes and night sweats mostly controled on current dosage.  No PMB, but would still prefer continuous Prometrium.  No pelvic pain.  No pain with IC.  Urine/BMs wnl.  Breasts wnl. BMI 42.09.  Health labs with Fam MD.  Past medical history,surgical history, family history and social history were all reviewed and documented in the EPIC chart.  Gynecologic History Patient's last menstrual period was 12/29/2012. Contraception: post menopausal status Last Pap: 03/2015. Results were: Negative/HPV HR neg Last mammogram: 03/2017. Results were: Negative Bone Density: Osteopenia T-Score -1.3 in 05/2016 Colonoscopy: 2013, 10 yr schedule  Obstetric History OB History  Gravida Para Term Preterm AB Living  3 3 3     3   SAB TAB Ectopic Multiple Live Births          3    # Outcome Date GA Lbr Len/2nd Weight Sex Delivery Anes PTL Lv  3 Term     F CS-Unspec  N LIV  2 Term     M CS-Unspec  N LIV  1 Term     F Vag-Spont  N LIV     ROS: A ROS was performed and pertinent positives and negatives are included in the history.  GENERAL: No fevers or chills. HEENT: No change in vision, no earache, sore throat or sinus congestion. NECK: No pain or stiffness. CARDIOVASCULAR: No chest pain or pressure. No palpitations. PULMONARY: No shortness of breath, cough or wheeze. GASTROINTESTINAL: No abdominal pain, nausea, vomiting or diarrhea, melena or bright red blood per rectum. GENITOURINARY: No urinary frequency, urgency, hesitancy or dysuria. MUSCULOSKELETAL: No joint or muscle pain, no back pain, no recent trauma. DERMATOLOGIC: No rash, no itching, no lesions. ENDOCRINE: No polyuria, polydipsia, no heat or cold intolerance. No recent change in weight. HEMATOLOGICAL: No  anemia or easy bruising or bleeding. NEUROLOGIC: No headache, seizures, numbness, tingling or weakness. PSYCHIATRIC: No depression, no loss of interest in normal activity or change in sleep pattern.     Exam:   BP 128/84   Ht 5' 4.75" (1.645 m)   Wt 251 lb (113.9 kg)   LMP 12/29/2012   BMI 42.09 kg/m   Body mass index is 42.09 kg/m.  General appearance : Well developed well nourished female. No acute distress HEENT: Eyes: no retinal hemorrhage or exudates,  Neck supple, trachea midline, no carotid bruits, no thyroidmegaly Lungs: Clear to auscultation, no rhonchi or wheezes, or rib retractions  Heart: Regular rate and rhythm, no murmurs or gallops Breast:Examined in sitting and supine position were symmetrical in appearance, no palpable masses or tenderness,  no skin retraction, no nipple inversion, no nipple discharge, no skin discoloration, no axillary or supraclavicular lymphadenopathy Abdomen: no palpable masses or tenderness, no rebound or guarding Extremities: no edema or skin discoloration or tenderness  Pelvic: Vulva: Normal             Vagina: No gross lesions or discharge  Cervix: No gross lesions or discharge. Pap reflex done  Uterus  RV, normal size, shape and consistency, non-tender and mobile  Adnexa  Without masses or tenderness  Anus: Normal   Assessment/Plan:  57 y.o. female for annual exam   1. Encounter for routine gynecological examination with  Papanicolaou smear of cervix Normal gynecologic exam and menopause.  Pap reflex done today.  Breast exam normal.  Last screening mammogram in February 2019, per patient and additional views were done which were normal.  Will obtain report from [place].  Health labs with family physician.  Colonoscopy in 2013.  2. Post-menopause on HRT (hormone replacement therapy) In third year of hormone replacement therapy with good control of vasomotor menopausal symptoms.  No contraindication to continue with hormone replacement  therapy.  Patient would prefer Prometrium to be taken continuously.  Prescription sent for estradiol 1 mg 1 tablet daily and Prometrium 100 mg 1 tablet daily at bedtime.  3. Osteopenia of multiple sites Osteopenia on last bone density in April 2018 with a T score of -1.3.  Recommend vitamin D supplements, calcium rich nutrition and regular weightbearing physical activity.  4. Class 3 severe obesity due to excess calories without serious comorbidity with body mass index (BMI) of 40.0 to 44.9 in adult [facility]) Obesity with a body mass index at 42.09.  Recommend increase physical activity with aerobic activities 5 times a week and weightlifting every 2 days.  Recommend a low calorie/low carb diet such as [employer].  5. Postmenopausal HRT (hormone replacement therapy) - progesterone (PROMETRIUM) 100 MG capsule; Take one tablet daily at bedtime - estradiol (ESTRACE) 1 MG tablet; Take 1 tablet (1 mg total) by mouth daily.  Princess Bruins MD, 8:17 AM 09/07/2017

## 2017-09-07 NOTE — Patient Instructions (Signed)
1. Encounter for routine gynecological examination with Papanicolaou smear of cervix Normal gynecologic exam and menopause.  Pap reflex done today.  Breast exam normal.  Last screening mammogram in February 2019, per patient and additional views were done which were normal.  Will obtain report from [place].  Health labs with family physician.  Colonoscopy in 2013.  2. Post-menopause on HRT (hormone replacement therapy) In third year of hormone replacement therapy with good control of vasomotor menopausal symptoms.  No contraindication to continue with hormone replacement therapy.  Patient would prefer Prometrium to be taken continuously.  Prescription sent for estradiol 1 mg 1 tablet daily and Prometrium 100 mg 1 tablet daily at bedtime.  3. Osteopenia of multiple sites Osteopenia on last bone density in April 2018 with a T score of -1.3.  Recommend vitamin D supplements, calcium rich nutrition and regular weightbearing physical activity.  4. Class 3 severe obesity due to excess calories without serious comorbidity with body mass index (BMI) of 40.0 to 44.9 in adult [facility]) Obesity with a body mass index at 42.09.  Recommend increase physical activity with aerobic activities 5 times a week and weightlifting every 2 days.  Recommend a low calorie/low carb diet such as [employer].  5. Postmenopausal HRT (hormone replacement therapy) - progesterone (PROMETRIUM) 100 MG capsule; Take one tablet daily at bedtime - estradiol (ESTRACE) 1 MG tablet; Take 1 tablet (1 mg total) by mouth daily.  Allison Frazier, it was a pleasure meeting you today!  I will inform you of your results as soon as they are available.   Exercising to Lose Weight Exercising can help you to lose weight. In order to lose weight through exercise, you need to do vigorous-intensity exercise. You can tell that you are exercising with vigorous intensity if you are breathing very hard and fast and cannot hold a conversation while  exercising. Moderate-intensity exercise helps to maintain your current weight. You can tell that you are exercising at a moderate level if you have a higher heart rate and faster breathing, but you are still able to hold a conversation. How often should I exercise? Choose an activity that you enjoy and set realistic goals. Your health care provider can help you to make an activity plan that works for you. Exercise regularly as directed by your health care provider. This may include:  Doing resistance training twice each week, such as: ? Push-ups. ? Sit-ups. ? Lifting weights. ? Using resistance bands.  Doing a given intensity of exercise for a given amount of time. Choose from these options: ? 150 minutes of moderate-intensity exercise every week. ? 75 minutes of vigorous-intensity exercise every week. ? A mix of moderate-intensity and vigorous-intensity exercise every week.  Children, pregnant women, people who are out of shape, people who are overweight, and older adults may need to consult a health care provider for individual recommendations. If you have any sort of medical condition, be sure to consult your health care provider before starting a new exercise program. What are some activities that can help me to lose weight?  Walking at a rate of at least 4.5 miles an hour.  Jogging or running at a rate of 5 miles per hour.  Biking at a rate of at least 10 miles per hour.  Lap swimming.  Roller-skating or in-line skating.  Cross-country skiing.  Vigorous competitive sports, such as football, basketball, and soccer.  Jumping rope.  Aerobic dancing. How can I be more active in my day-to-day activities?  Use the stairs instead of the elevator.  Take a walk during your lunch break.  If you drive, park your car farther away from work or school.  If you take public transportation, get off one stop early and walk the rest of the way.  Make all of your phone calls while  standing up and walking around.  Get up, stretch, and walk around every 30 minutes throughout the day. What guidelines should I follow while exercising?  Do not exercise so much that you hurt yourself, feel dizzy, or get very short of breath.  Consult your health care provider prior to starting a new exercise program.  Wear comfortable clothes and shoes with good support.  Drink plenty of water while you exercise to prevent dehydration or heat stroke. Body water is lost during exercise and must be replaced.  Work out until you breathe faster and your heart beats faster. This information is not intended to replace advice given to you by your health care provider. Make sure you discuss any questions you have with your health care provider. Document Released: 03/01/2010 Document Revised: 07/05/2015 Document Reviewed: 06/30/2013 Elsevier Interactive Patient Education  2018 [employer] Calories are units of energy. Your body needs a certain amount of calories from food to keep you going throughout the day. When you eat more calories than your body needs, your body stores the extra calories as fat. When you eat fewer calories than your body needs, your body burns fat to get the energy it needs. Calorie counting means keeping track of how many calories you eat and drink each day. Calorie counting can be helpful if you need to lose weight. If you make sure to eat fewer calories than your body needs, you should lose weight. Ask your health care provider what a healthy weight is for you. For calorie counting to work, you will need to eat the right number of calories in a day in order to lose a healthy amount of weight per week. A dietitian can help you determine how many calories you need in a day and will give you suggestions on how to reach your calorie goal.  A healthy amount of weight to lose per week is usually 1-2 lb (0.5-0.9 kg). This usually means that your  daily calorie intake should be reduced by 500-750 calories.  Eating 1,200 - 1,500 calories per day can help most women lose weight.  Eating 1,500 - 1,800 calories per day can help most men lose weight.  What is my plan? My goal is to have __________ calories per day. If I have this many calories per day, I should lose around __________ pounds per week. What do I need to know about calorie counting? In order to meet your daily calorie goal, you will need to:  Find out how many calories are in each food you would like to eat. Try to do this before you eat.  Decide how much of the food you plan to eat.  Write down what you ate and how many calories it had. Doing this is called keeping a food log.  To successfully lose weight, it is important to balance calorie counting with a healthy lifestyle that includes regular activity. Aim for 150 minutes of moderate exercise (such as walking) or 75 minutes of vigorous exercise (such as running) each week. Where do I find calorie information?  The number of calories in a food can be found on a Nutrition Facts  label. If a food does not have a Nutrition Facts label, try to look up the calories online or ask your dietitian for help. Remember that calories are listed per serving. If you choose to have more than one serving of a food, you will have to multiply the calories per serving by the amount of servings you plan to eat. For example, the label on a package of bread might say that a serving size is 1 slice and that there are 90 calories in a serving. If you eat 1 slice, you will have eaten 90 calories. If you eat 2 slices, you will have eaten 180 calories. How do I keep a food log? Immediately after each meal, record the following information in your food log:  What you ate. Don't forget to include toppings, sauces, and other extras on the food.  How much you ate. This can be measured in cups, ounces, or number of items.  How many calories each food  and drink had.  The total number of calories in the meal.  Keep your food log near you, such as in a small notebook in your pocket, or use a mobile app or website. Some programs will calculate calories for you and show you how many calories you have left for the day to meet your goal. What are some calorie counting tips?  Use your calories on foods and drinks that will fill you up and not leave you hungry: ? Some examples of foods that fill you up are nuts and nut butters, vegetables, lean proteins, and high-fiber foods like whole grains. High-fiber foods are foods with more than 5 g fiber per serving. ? Drinks such as sodas, specialty coffee drinks, alcohol, and juices have a lot of calories, yet do not fill you up.  Eat nutritious foods and avoid empty calories. Empty calories are calories you get from foods or beverages that do not have many vitamins or protein, such as candy, sweets, and soda. It is better to have a nutritious high-calorie food (such as an avocado) than a food with few nutrients (such as a bag of chips).  Know how many calories are in the foods you eat most often. This will help you calculate calorie counts faster.  Pay attention to calories in drinks. Low-calorie drinks include water and unsweetened drinks.  Pay attention to nutrition labels for "low fat" or "fat free" foods. These foods sometimes have the same amount of calories or more calories than the full fat versions. They also often have added sugar, starch, or salt, to make up for flavor that was removed with the fat.  Find a way of tracking calories that works for you. Get creative. Try different apps or programs if writing down calories does not work for you. What are some portion control tips?  Know how many calories are in a serving. This will help you know how many servings of a certain food you can have.  Use a measuring cup to measure serving sizes. You could also try weighing out portions on a kitchen  scale. With time, you will be able to estimate serving sizes for some foods.  Take some time to put servings of different foods on your favorite plates, bowls, and cups so you know what a serving looks like.  Try not to eat straight from a bag or box. Doing this can lead to overeating. Put the amount you would like to eat in a cup or on a plate to make sure  you are eating the right portion.  Use smaller plates, glasses, and bowls to prevent overeating.  Try not to multitask (for example, watch TV or use your computer) while eating. If it is time to eat, sit down at a table and enjoy your food. This will help you to know when you are full. It will also help you to be aware of what you are eating and how much you are eating. What are tips for following this plan? Reading food labels  Check the calorie count compared to the serving size. The serving size may be smaller than what you are used to eating.  Check the source of the calories. Make sure the food you are eating is high in vitamins and protein and low in saturated and trans fats. Shopping  Read nutrition labels while you shop. This will help you make healthy decisions before you decide to purchase your food.  Make a grocery list and stick to it. Cooking  Try to cook your favorite foods in a healthier way. For example, try baking instead of frying.  Use low-fat dairy products. Meal planning  Use more fruits and vegetables. Half of your plate should be fruits and vegetables.  Include lean proteins like poultry and fish. How do I count calories when eating out?  Ask for smaller portion sizes.  Consider sharing an entree and sides instead of getting your own entree.  If you get your own entree, eat only half. Ask for a box at the beginning of your meal and put the rest of your entree in it so you are not tempted to eat it.  If calories are listed on the menu, choose the lower calorie options.  Choose dishes that include  vegetables, fruits, whole grains, low-fat dairy products, and lean protein.  Choose items that are boiled, broiled, grilled, or steamed. Stay away from items that are buttered, battered, fried, or served with cream sauce. Items labeled "crispy" are usually fried, unless stated otherwise.  Choose water, low-fat milk, unsweetened iced tea, or other drinks without added sugar. If you want an alcoholic beverage, choose a lower calorie option such as a glass of wine or light beer.  Ask for dressings, sauces, and syrups on the side. These are usually high in calories, so you should limit the amount you eat.  If you want a salad, choose a garden salad and ask for grilled meats. Avoid extra toppings like bacon, cheese, or fried items. Ask for the dressing on the side, or ask for olive oil and vinegar or lemon to use as dressing.  Estimate how many servings of a food you are given. For example, a serving of cooked rice is  cup or about the size of half a baseball. Knowing serving sizes will help you be aware of how much food you are eating at restaurants. The list below tells you how big or small some common portion sizes are based on everyday objects: ? 1 oz-4 stacked dice. ? 3 oz-1 deck of cards. ? 1 tsp-1 die. ? 1 Tbsp- a ping-pong ball. ? 2 Tbsp-1 ping-pong ball. ?  cup- baseball. ? 1 cup-1 baseball. Summary  Calorie counting means keeping track of how many calories you eat and drink each day. If you eat fewer calories than your body needs, you should lose weight.  A healthy amount of weight to lose per week is usually 1-2 lb (0.5-0.9 kg). This usually means reducing your daily calorie intake by 500-750 calories.  The  number of calories in a food can be found on a Nutrition Facts label. If a food does not have a Nutrition Facts label, try to look up the calories online or ask your dietitian for help.  Use your calories on foods and drinks that will fill you up, and not on foods and drinks  that will leave you hungry.  Use smaller plates, glasses, and bowls to prevent overeating. This information is not intended to replace advice given to you by your health care provider. Make sure you discuss any questions you have with your health care provider. Document Released: 01/27/2005 Document Revised: 12/28/2015 Document Reviewed: 12/28/2015 Elsevier Interactive Patient Education  [employer].

## 2017-09-08 LAB — PAP IG W/ RFLX HPV ASCU

## 2017-09-18 DIAGNOSIS — Z1389 Encounter for screening for other disorder: Secondary | ICD-10-CM | POA: Diagnosis not present

## 2017-09-18 DIAGNOSIS — J0100 Acute maxillary sinusitis, unspecified: Secondary | ICD-10-CM | POA: Diagnosis not present

## 2017-09-18 DIAGNOSIS — J302 Other seasonal allergic rhinitis: Secondary | ICD-10-CM | POA: Diagnosis not present

## 2017-11-11 ENCOUNTER — Emergency Department: Payer: Commercial Managed Care - PPO

## 2017-11-11 ENCOUNTER — Emergency Department
Admission: EM | Admit: 2017-11-11 | Discharge: 2017-11-11 | Disposition: A | Discharge status: Home / Self Care | Payer: Commercial Managed Care - PPO | Attending: Emergency Medicine | Admitting: Emergency Medicine

## 2017-11-11 ENCOUNTER — Other Ambulatory Visit: Payer: Self-pay

## 2017-11-11 DIAGNOSIS — R55 Syncope and collapse: Secondary | ICD-10-CM | POA: Insufficient documentation

## 2017-11-11 DIAGNOSIS — M25552 Pain in left hip: Secondary | ICD-10-CM | POA: Diagnosis not present

## 2017-11-11 HISTORY — DX: Syncope and collapse: R55

## 2017-11-11 LAB — CBC WITH DIFFERENTIAL/PLATELET
BASOS ABS: 0 10*3/uL (ref 0–0.1)
Basophils Relative: 1 %
EOS PCT: 1 %
Eosinophils Absolute: 0.1 10*3/uL (ref 0–0.7)
HCT: 42.5 % (ref 35.0–47.0)
Hemoglobin: 15 g/dL (ref 12.0–16.0)
LYMPHS ABS: 1.7 10*3/uL (ref 1.0–3.6)
Lymphocytes Relative: 23 %
MCH: 31.1 pg (ref 26.0–34.0)
MCHC: 35.4 g/dL (ref 32.0–36.0)
MCV: 88 fL (ref 80.0–100.0)
Monocytes Absolute: 0.4 10*3/uL (ref 0.2–0.9)
Monocytes Relative: 5 %
Neutro Abs: 5.1 10*3/uL (ref 1.4–6.5)
Neutrophils Relative %: 70 %
Platelets: 251 10*3/uL (ref 150–440)
RBC: 4.83 MIL/uL (ref 3.80–5.20)
RDW: 13.7 % (ref 11.5–14.5)
WBC: 7.3 10*3/uL (ref 3.6–11.0)

## 2017-11-11 LAB — T4, FREE: FREE T4: 1.07 ng/dL (ref 0.82–1.77)

## 2017-11-11 LAB — URINALYSIS, COMPLETE (UACMP) WITH MICROSCOPIC
BILIRUBIN URINE: NEGATIVE
Glucose, UA: NEGATIVE mg/dL
Hgb urine dipstick: NEGATIVE
KETONES UR: NEGATIVE mg/dL
LEUKOCYTES UA: NEGATIVE
Nitrite: NEGATIVE
Protein, ur: NEGATIVE mg/dL
Specific Gravity, Urine: 1.021 (ref 1.005–1.030)
pH: 5 (ref 5.0–8.0)

## 2017-11-11 LAB — COMPREHENSIVE METABOLIC PANEL
ALT: 20 U/L (ref 0–44)
AST: 25 U/L (ref 15–41)
Albumin: 4.4 g/dL (ref 3.5–5.0)
Alkaline Phosphatase: 62 U/L (ref 38–126)
Anion gap: 10 (ref 5–15)
BILIRUBIN TOTAL: 0.5 mg/dL (ref 0.3–1.2)
BUN: 20 mg/dL (ref 6–20)
CO2: 23 mmol/L (ref 22–32)
Calcium: 9.9 mg/dL (ref 8.9–10.3)
Chloride: 105 mmol/L (ref 98–111)
Creatinine, Ser: 0.73 mg/dL (ref 0.44–1.00)
GFR calc Af Amer: >60 mL/min (ref >60)
GFR calc non Af Amer: >60 mL/min (ref >60)
Glucose, Bld: 181 mg/dL — ABNORMAL HIGH (ref 70–99)
POTASSIUM: 3.7 mmol/L (ref 3.5–5.1)
Sodium: 138 mmol/L (ref 135–145)
Total Protein: 7.1 g/dL (ref 6.5–8.1)

## 2017-11-11 LAB — TSH: TSH: 1.429 u[IU]/mL (ref 0.350–4.500)

## 2017-11-11 LAB — TROPONIN I: Troponin I: <0.03 ng/mL (ref <0.03)

## 2017-11-11 MED ORDER — SODIUM CHLORIDE 0.9 % IV SOLN
1000.0000 mL | Freq: Once | INTRAVENOUS | Status: AC
  Administered 2017-11-11: 1000 mL via INTRAVENOUS

## 2017-11-11 MED ORDER — DIAZEPAM 5 MG PO TABS
5.0000 mg | ORAL_TABLET | Freq: Once | ORAL | Status: AC
  Administered 2017-11-11: 5 mg via ORAL
  Filled 2017-11-11: qty 1

## 2017-11-11 NOTE — ED Triage Notes (Signed)
Pt was at work, felt near syncopal episode- CBG 180 with EMS, VSS.  Hx of same x 2 years ago. Pt alert and oriented X4, active, cooperative, pt in NAD. RR even and unlabored, color WNL.

## 2017-11-11 NOTE — ED Notes (Signed)
ED Provider at bedside. 

## 2017-11-11 NOTE — ED Notes (Signed)

## 2017-11-11 NOTE — ED Provider Notes (Signed)
[facility] Emergency Department Provider Note       Time seen: ----------------------------------------- 1:08 PM on 11/11/2017 -----------------------------------------   I have reviewed the triage vital signs and the nursing notes.  HISTORY   Chief Complaint Near Syncope    HPI Allison Frazier is a 57 y.o. female with a history of hyperlipidemia, near syncope, obesity who presents to the ED for near syncope.  Patient was at work and felt near syncopal.  Patient states she was driving when the event occurred.  She had this once happened years ago with no specific etiology being given.  She denies any recent illness, symptoms or other complaints at this time.  Past Medical History:  Diagnosis Date  . Chondromalacia of both patellae   . High cholesterol     Patient Active Problem List   Diagnosis Date Noted  . Near syncope 02/17/2013  . Chest pain 07/11/2012  . Morbid obesity ([place]) 07/11/2012  . Overweight(278.02) 06/23/2012  . Fibroid uterus 05/05/2011  . High cholesterol     Past Surgical History:  Procedure Laterality Date  . CESAREAN SECTION  1996  . CESAREAN SECTION  1998  . ENDOMETRIAL ABLATION  04/04/07   HER OPTION  . LASIX     REFRACTIVE SURGERY-  RIGHT EYE  . TUBAL LIGATION  1998    Allergies Simvastatin  Social History Social History   Tobacco Use  . Smoking status: Never Smoker  . Smokeless tobacco: Never Used  Substance Use Topics  . Alcohol use: Yes    Alcohol/week: 0.0 standard drinks    Comment: social  . Drug use: No   Review of Systems Constitutional: Negative for fever. Cardiovascular: Negative for chest pain. Respiratory: Negative for shortness of breath. Gastrointestinal: Negative for abdominal pain, vomiting and diarrhea. Musculoskeletal: Negative for back pain. Skin: Negative for rash. Neurological: Negative for headaches, focal weakness or numbness.  All systems negative/normal/unremarkable except as  stated in the HPI  ____________________________________________   PHYSICAL EXAM:  VITAL SIGNS: ED Triage Vitals [11/11/17 1308]  Enc Vitals Group     BP      Pulse      Resp      Temp      Temp src      SpO2      Weight      Height      Head Circumference      Peak Flow      Pain Score 0     Pain Loc      Pain Edu?      Excl. in [place]?    Constitutional: Alert and oriented. Well appearing and in no distress. Eyes: Conjunctivae are normal. Normal extraocular movements. ENT   Head: Normocephalic and atraumatic.   Nose: No congestion/rhinnorhea.   Mouth/Throat: Mucous membranes are moist.   Neck: No stridor. Cardiovascular: Normal rate, regular rhythm. No murmurs, rubs, or gallops. Respiratory: Normal respiratory effort without tachypnea nor retractions. Breath sounds are clear and equal bilaterally. No wheezes/rales/rhonchi. Gastrointestinal: Soft and nontender. Normal bowel sounds Musculoskeletal: Nontender with normal range of motion in extremities. No lower extremity tenderness nor edema. Neurologic:  Normal speech and language. No gross focal neurologic deficits are appreciated.  Skin:  Skin is warm, dry and intact. No rash noted. Psychiatric: Mood and affect are normal. Speech and behavior are normal.  ____________________________________________  EKG: Interpreted by me.  Sinus rhythm rate 91 bpm, normal PR interval, normal QRS, normal QT  ____________________________________________  ED COURSE:  As part of my medical decision making, I reviewed the following data within the [place] History obtained from family if available, nursing notes, old chart and ekg, as well as notes from prior ED visits. Patient presented for near syncope, we will assess with labs and imaging as indicated at this time.   Procedures ____________________________________________   LABS (pertinent positives/negatives)  Labs Reviewed  COMPREHENSIVE METABOLIC  PANEL - Abnormal; Notable for the following components:      Result Value   Glucose, Bld 181 (*)    All other components within normal limits  URINALYSIS, COMPLETE (UACMP) WITH MICROSCOPIC - Abnormal; Notable for the following components:   Color, Urine YELLOW (*)    APPearance HAZY (*)    Bacteria, UA RARE (*)    All other components within normal limits  CBC WITH DIFFERENTIAL/PLATELET  TROPONIN I   ____________________________________________  DIFFERENTIAL DIAGNOSIS   Dehydration, electrolyte abnormality, arrhythmia, occult infection  FINAL ASSESSMENT AND PLAN  Near syncope   Plan: The patient had presented for near syncope. Patient's labs did not reveal any acute process.  She did not require any imaging during her visit, was not orthostatic.  We did give IV fluids.  No specific etiology was discovered, she is cleared for outpatient follow-up.   Laurence Aly, MD   Note: This note was generated in part or whole with voice recognition software. Voice recognition is usually quite accurate but there are transcription errors that can and very often do occur. I apologize for any typographical errors that were not detected and corrected.     Earleen Newport, MD 11/11/17 [phone]

## 2017-12-12 ENCOUNTER — Other Ambulatory Visit: Payer: Self-pay

## 2017-12-12 ENCOUNTER — Encounter (HOSPITAL_COMMUNITY): Payer: Self-pay

## 2017-12-12 ENCOUNTER — Emergency Department (HOSPITAL_COMMUNITY): Payer: Commercial Managed Care - PPO

## 2017-12-12 ENCOUNTER — Observation Stay (HOSPITAL_COMMUNITY)
Admission: EM | Admit: 2017-12-12 | Discharge: 2017-12-13 | Disposition: A | Discharge status: Home / Self Care | Payer: Commercial Managed Care - PPO | Attending: Family Medicine | Admitting: Family Medicine

## 2017-12-12 DIAGNOSIS — G459 Transient cerebral ischemic attack, unspecified: Principal | ICD-10-CM | POA: Insufficient documentation

## 2017-12-12 DIAGNOSIS — Z79890 Hormone replacement therapy (postmenopausal): Secondary | ICD-10-CM | POA: Insufficient documentation

## 2017-12-12 DIAGNOSIS — Z6841 Body Mass Index (BMI) 40.0 and over, adult: Secondary | ICD-10-CM | POA: Insufficient documentation

## 2017-12-12 DIAGNOSIS — R29818 Other symptoms and signs involving the nervous system: Secondary | ICD-10-CM | POA: Diagnosis present

## 2017-12-12 DIAGNOSIS — Z79899 Other long term (current) drug therapy: Secondary | ICD-10-CM | POA: Insufficient documentation

## 2017-12-12 DIAGNOSIS — Z7982 Long term (current) use of aspirin: Secondary | ICD-10-CM | POA: Insufficient documentation

## 2017-12-12 DIAGNOSIS — R29810 Facial weakness: Secondary | ICD-10-CM | POA: Diagnosis not present

## 2017-12-12 DIAGNOSIS — R2990 Unspecified symptoms and signs involving the nervous system: Secondary | ICD-10-CM

## 2017-12-12 DIAGNOSIS — R202 Paresthesia of skin: Secondary | ICD-10-CM | POA: Diagnosis not present

## 2017-12-12 DIAGNOSIS — R200 Anesthesia of skin: Secondary | ICD-10-CM | POA: Diagnosis present

## 2017-12-12 DIAGNOSIS — I639 Cerebral infarction, unspecified: Secondary | ICD-10-CM | POA: Diagnosis not present

## 2017-12-12 LAB — COMPREHENSIVE METABOLIC PANEL
ALK PHOS: 63 U/L (ref 38–126)
ALT: 23 U/L (ref 0–44)
ANION GAP: 8 (ref 5–15)
AST: 22 U/L (ref 15–41)
Albumin: 4.3 g/dL (ref 3.5–5.0)
BILIRUBIN TOTAL: 0.7 mg/dL (ref 0.3–1.2)
BUN: 19 mg/dL (ref 6–20)
CALCIUM: 10.1 mg/dL (ref 8.9–10.3)
CO2: 26 mmol/L (ref 22–32)
Chloride: 105 mmol/L (ref 98–111)
Creatinine, Ser: 0.78 mg/dL (ref 0.44–1.00)
GFR calc non Af Amer: >60 mL/min (ref >60)
Glucose, Bld: 121 mg/dL — ABNORMAL HIGH (ref 70–99)
Potassium: 3.5 mmol/L (ref 3.5–5.1)
SODIUM: 139 mmol/L (ref 135–145)
TOTAL PROTEIN: 7.3 g/dL (ref 6.5–8.1)

## 2017-12-12 LAB — CBG MONITORING, ED: Glucose-Capillary: 120 mg/dL — ABNORMAL HIGH (ref 70–99)

## 2017-12-12 LAB — URINALYSIS, ROUTINE W REFLEX MICROSCOPIC
Bilirubin Urine: NEGATIVE
Glucose, UA: NEGATIVE mg/dL
Hgb urine dipstick: NEGATIVE
KETONES UR: NEGATIVE mg/dL
Nitrite: NEGATIVE
PROTEIN: NEGATIVE mg/dL
Specific Gravity, Urine: 1.015 (ref 1.005–1.030)
pH: 6 (ref 5.0–8.0)

## 2017-12-12 LAB — RAPID URINE DRUG SCREEN, HOSP PERFORMED
Amphetamines: NOT DETECTED
BENZODIAZEPINES: NOT DETECTED
Barbiturates: NOT DETECTED
COCAINE: NOT DETECTED
Opiates: NOT DETECTED
Tetrahydrocannabinol: NOT DETECTED

## 2017-12-12 LAB — POCT I-STAT TROPONIN I: TROPONIN I, POC: 0 ng/mL (ref 0.00–0.08)

## 2017-12-12 LAB — CBC
HEMATOCRIT: 43.9 % (ref 36.0–46.0)
Hemoglobin: 14.1 g/dL (ref 12.0–15.0)
MCH: 28.5 pg (ref 26.0–34.0)
MCHC: 32.1 g/dL (ref 30.0–36.0)
MCV: 88.9 fL (ref 80.0–100.0)
PLATELETS: 273 10*3/uL (ref 150–400)
RBC: 4.94 MIL/uL (ref 3.87–5.11)
RDW: 13.1 % (ref 11.5–15.5)
WBC: 7 10*3/uL (ref 4.0–10.5)
nRBC: 0 % (ref 0.0–0.2)

## 2017-12-12 LAB — POCT I-STAT, CHEM 8
BUN: 20 mg/dL (ref 6–20)
CALCIUM ION: 1.31 mmol/L (ref 1.15–1.40)
CHLORIDE: 105 mmol/L (ref 98–111)
Creatinine, Ser: 0.7 mg/dL (ref 0.44–1.00)
GLUCOSE: 119 mg/dL — AB (ref 70–99)
HCT: 44 % (ref 36.0–46.0)
HEMOGLOBIN: 15 g/dL (ref 12.0–15.0)
Potassium: 3.6 mmol/L (ref 3.5–5.1)
Sodium: 140 mmol/L (ref 135–145)
TCO2: 23 mmol/L (ref 22–32)

## 2017-12-12 LAB — DIFFERENTIAL
Abs Immature Granulocytes: 0.02 10*3/uL (ref 0.00–0.07)
BASOS ABS: 0.1 10*3/uL (ref 0.0–0.1)
Basophils Relative: 1 %
EOS PCT: 3 %
Eosinophils Absolute: 0.2 10*3/uL (ref 0.0–0.5)
Immature Granulocytes: 0 %
LYMPHS PCT: 35 %
Lymphs Abs: 2.4 10*3/uL (ref 0.7–4.0)
MONO ABS: 0.5 10*3/uL (ref 0.1–1.0)
Monocytes Relative: 7 %
Neutro Abs: 3.8 10*3/uL (ref 1.7–7.7)
Neutrophils Relative %: 54 %

## 2017-12-12 LAB — I-STAT BETA HCG BLOOD, ED (NOT ORDERABLE)

## 2017-12-12 LAB — ETHANOL

## 2017-12-12 LAB — PROTIME-INR
INR: 0.96
Prothrombin Time: 12.7 seconds (ref 11.4–15.2)

## 2017-12-12 LAB — APTT: APTT: 34 s (ref 24–36)

## 2017-12-12 MED ORDER — ASPIRIN 325 MG PO TABS
325.0000 mg | ORAL_TABLET | Freq: Every day | ORAL | Status: DC
  Administered 2017-12-12 – 2017-12-13 (×2): 325 mg via ORAL
  Filled 2017-12-12: qty 1

## 2017-12-12 MED ORDER — SODIUM CHLORIDE 0.9 % IV SOLN
INTRAVENOUS | Status: AC
  Administered 2017-12-12: 22:00:00 via INTRAVENOUS

## 2017-12-12 MED ORDER — SENNOSIDES-DOCUSATE SODIUM 8.6-50 MG PO TABS
1.0000 | ORAL_TABLET | Freq: Every evening | ORAL | Status: DC | PRN
  Filled 2017-12-12: qty 1

## 2017-12-12 MED ORDER — ACETAMINOPHEN 160 MG/5ML PO SOLN: 650.0000 mg | ORAL | Status: DC | PRN

## 2017-12-12 MED ORDER — ASPIRIN 300 MG RE SUPP: 300.0000 mg | Freq: Every day | RECTAL | Status: DC

## 2017-12-12 MED ORDER — ONDANSETRON HCL 4 MG/2ML IJ SOLN: 4.0000 mg | Freq: Four times a day (QID) | INTRAMUSCULAR | Status: DC | PRN

## 2017-12-12 MED ORDER — ENOXAPARIN SODIUM 40 MG/0.4ML ~~LOC~~ SOLN: 40.0000 mg | SUBCUTANEOUS | Status: DC

## 2017-12-12 MED ORDER — ACETAMINOPHEN 325 MG PO TABS: 650.0000 mg | ORAL_TABLET | ORAL | Status: DC | PRN

## 2017-12-12 MED ORDER — STROKE: EARLY STAGES OF RECOVERY BOOK
Freq: Once | Status: DC
  Filled 2017-12-12: qty 1

## 2017-12-12 MED ORDER — ACETAMINOPHEN 650 MG RE SUPP: 650.0000 mg | RECTAL | Status: DC | PRN

## 2017-12-12 MED ORDER — HYDROCODONE-ACETAMINOPHEN 5-325 MG PO TABS: 1.0000 | ORAL_TABLET | ORAL | Status: DC | PRN

## 2017-12-12 NOTE — ED Notes (Signed)
Pt reports the numbness to left face is subsiding.

## 2017-12-12 NOTE — ED Provider Notes (Signed)
Emergency Department Provider Note   I have reviewed the triage vital signs and the nursing notes.   HISTORY  Chief Complaint facial numbness (left side)   HPI Allison Frazier is a 57 y.o. female with PMH of elevated BMI and HLD presents to the ED with acute onset left face numbness at 09:10 PM. Patient was driving when symptoms began. She notes some pain in the left shoulder as well. No numbness/weakness in the LUE or LLE. No vision or speech changes. No difficulty swallowing. Symptoms are persistent. No radiation of symptoms.    Past Medical History:  Diagnosis Date  . Chondromalacia of both patellae   . High cholesterol     Patient Active Problem List   Diagnosis Date Noted  . Acute focal neurological deficit 12/12/2017  . Left facial numbness 12/12/2017  . Acute focal neurologic deficit with partial resolution 12/12/2017  . Near syncope 02/17/2013  . Morbid obesity ([place]) 07/11/2012  . Overweight(278.02) 06/23/2012  . Fibroid uterus 05/05/2011  . High cholesterol     Past Surgical History:  Procedure Laterality Date  . CESAREAN SECTION  1996  . CESAREAN SECTION  1998  . ENDOMETRIAL ABLATION  04/04/07   HER OPTION  . LASIX     REFRACTIVE SURGERY-  RIGHT EYE  . TUBAL LIGATION  1998    Allergies Simvastatin  Family History  Problem Relation Age of Onset  . Heart disease Father   . Cancer Father        Prostate    Social History Social History   Tobacco Use  . Smoking status: Never Smoker  . Smokeless tobacco: Never Used  Substance Use Topics  . Alcohol use: Yes    Alcohol/week: 0.0 standard drinks    Comment: social  . Drug use: No    Review of Systems  Constitutional: No fever/chills Eyes: No visual changes. ENT: No sore throat. Cardiovascular: Denies chest pain. Respiratory: Denies shortness of breath. Gastrointestinal: No abdominal pain.  No nausea, no vomiting.  No diarrhea.  No constipation. Genitourinary: Negative for  dysuria. Musculoskeletal: Negative for back pain. Skin: Negative for rash. Neurological: Negative for headaches. Left arm/leg numbness.   10-point ROS otherwise negative.  ____________________________________________   PHYSICAL EXAM:  VITAL SIGNS: Vitals:   12/13/17 0600 12/13/17 0605  BP: 112/66   Pulse: 65 64  Resp: 16 16  SpO2: 95% 97%   Constitutional: Alert and oriented. Well appearing and in no acute distress. Eyes: Conjunctivae are normal. Head: Atraumatic. Nose: No congestion/rhinnorhea. Mouth/Throat: Mucous membranes are moist.  Oropharynx non-erythematous. Neck: No stridor.   Cardiovascular: Normal rate, regular rhythm. Good peripheral circulation. Grossly normal heart sounds.   Respiratory: Normal respiratory effort.  No retractions. Lungs CTAB. Gastrointestinal: Soft and nontender. No distention.  Musculoskeletal: No lower extremity tenderness nor edema. No gross deformities of extremities. Neurologic:  Normal speech and language. Positive left face numbness. No weakness. Otherwise normal CN exam 2-12. Normal strength and sensation in the upper and lower extremities.  Skin:  Skin is warm, dry and intact. No rash noted.  ____________________________________________   LABS (all labs ordered are listed, but only abnormal results are displayed)  Labs Reviewed  COMPREHENSIVE METABOLIC PANEL - Abnormal; Notable for the following components:      Result Value   Glucose, Bld 121 (*)    All other components within normal limits  URINALYSIS, ROUTINE W REFLEX MICROSCOPIC - Abnormal; Notable for the following components:   APPearance CLOUDY (*)    Leukocytes,  UA MODERATE (*)    Bacteria, UA MANY (*)    All other components within normal limits  LIPID PANEL - Abnormal; Notable for the following components:   Cholesterol 215 (*)    LDL Cholesterol 141 (*)    All other components within normal limits  CBG MONITORING, ED - Abnormal; Notable for the following  components:   Glucose-Capillary 120 (*)    All other components within normal limits  POCT I-STAT, CHEM 8 - Abnormal; Notable for the following components:   Glucose, Bld 119 (*)    All other components within normal limits  ETHANOL  PROTIME-INR  APTT  CBC  DIFFERENTIAL  RAPID URINE DRUG SCREEN, HOSP PERFORMED  HIV ANTIBODY (ROUTINE TESTING W REFLEX)  HEMOGLOBIN A1C  I-STAT CHEM 8, ED  I-STAT TROPONIN, ED  I-STAT BETA HCG BLOOD, ED ([facility], [facility], [facility] ONLY)  I-STAT BETA HCG BLOOD, ED (NOT ORDERABLE)  POCT I-STAT TROPONIN I   ____________________________________________  EKG   EKG Interpretation  Date/Time:  Saturday December 12 2017 20:33:04 EDT Ventricular Rate:  89 PR Interval:    QRS Duration: 102 QT Interval:  362 QTC Calculation: 441 R Axis:   66 Text Interpretation:  Sinus rhythm Low voltage, precordial leads Borderline T abnormalities, anterior leads Artifact in lead(s) I II aVR aVL aVF V5 No STEMI.  Confirmed by Nanda Quinton [phone]) on 12/13/2017 8:37:34 AM       ____________________________________________  RADIOLOGY  Ct Head Code Stroke Wo Contrast  Result Date: 12/12/2017 CLINICAL DATA:  Code stroke.  Left-sided weakness EXAM: CT HEAD WITHOUT CONTRAST TECHNIQUE: Contiguous axial images were obtained from the base of the skull through the vertex without intravenous contrast. COMPARISON:  05/10/2007 head CT FINDINGS: Bra Brain: There is no mass, hemorrhage or extra-axial collection. The size and configuration of the ventricles and extra-axial CSF spaces are normal. There is no acute or chronic infarction. The brain parenchyma is normal. Vascular: No abnormal hyperdensity of the major intracranial arteries or dural venous sinuses. No intracranial atherosclerosis. Skull: The visualized skull base, calvarium and extracranial soft tissues are normal. Sinuses/Orbits: No fluid levels or advanced mucosal thickening of the visualized paranasal sinuses. No mastoid or middle ear  effusion. The orbits are normal. ASPECTS [facility] Stroke Program Early CT Score) - Ganglionic level infarction (caudate, lentiform nuclei, internal capsule, insula, M1-M3 cortex): 7 - Supraganglionic infarction (M4-M6 cortex): 3 Total score (0-10 with 10 being normal): 10 IMPRESSION: 1. Normal head CT. 2. ASPECTS is 10. These results were called by telephone at the time of interpretation on 12/12/2017 at 9:01 pm to Dr. Nanda Quinton , who verbally acknowledged these results. Electronically Signed   By: Ulyses Jarred M.D.   On: 12/12/2017 21:01    ____________________________________________   PROCEDURES  Procedure(s) performed:   Procedures  None ____________________________________________   INITIAL IMPRESSION / ASSESSMENT AND PLAN / ED COURSE  Pertinent labs & imaging results that were available during my care of the patient were reviewed by me and considered in my medical decision making (see chart for details).  Patient arrives with acute onset left face numbness. No other neuro deficits. No prior history of CVA. Code Stroke activated. Neuro recommends admit for CVA w/u bu no CVA with only subjective left face numbness. Numbness improving but remains present. CT head is normal. Labs and EKG with no acute findings.   Discussed patient's case with Hospitalist, Dr. Myna Hidalgo to request admission. Patient and family (if present) updated with plan. Care transferred to Hospitalist service.  I reviewed all nursing notes, vitals, pertinent old records, EKGs, labs, imaging (as available).  ____________________________________________  FINAL CLINICAL IMPRESSION(S) / ED DIAGNOSES  Final diagnoses:  Numbness and tingling of left side of face  Stroke-like symptoms     MEDICATIONS GIVEN DURING THIS VISIT:  Medications   stroke: mapping our early stages of recovery book ( Does not apply Not Given 12/13/17 0705)  0.9 %  sodium chloride infusion ( Intravenous New Bag/Given 12/12/17 2219)   acetaminophen (TYLENOL) tablet 650 mg (has no administration in time range)    Or  acetaminophen (TYLENOL) solution 650 mg (has no administration in time range)    Or  acetaminophen (TYLENOL) suppository 650 mg (has no administration in time range)  senna-docusate (Senokot-S) tablet 1 tablet (has no administration in time range)  enoxaparin (LOVENOX) injection 40 mg (has no administration in time range)  aspirin suppository 300 mg ( Rectal See Alternative 12/13/17 0234)    Or  aspirin tablet 325 mg (325 mg Oral Not Given 12/13/17 0234)  ondansetron (ZOFRAN) injection 4 mg (has no administration in time range)  HYDROcodone-acetaminophen (NORCO/VICODIN) 5-325 MG per tablet 1-2 tablet (has no administration in time range)    Note:  This document was prepared using Dragon voice recognition software and may include unintentional dictation errors.  Nanda Quinton, MD Emergency Medicine    Thana Ramp, [facility], MD 12/13/17 [phone]

## 2017-12-12 NOTE — ED Notes (Signed)
Dr Coralyn Helling at bedside

## 2017-12-12 NOTE — Consult Note (Signed)
[facility] Tele[facility]urology Consult Services   Date of Service:   12/12/2017 20:50:25  Impression:     .  L face numbness  Comments: 57 yo with L face numbness. No objective findings on exam. With unilateral symptoms isolated to face, still needs admission for work-up but not a candidate for IV tPA or NIR as NIHSS 0.  Metrics: Last Known Well: 12/12/2017 20:00:00 [facility] Notification Time: 12/12/2017 20:49:44 Arrival Time: 12/12/2017 20:15:00 Stamp Time: 12/12/2017 20:50:25 Time First Login Attempt: 12/12/2017 20:58:00 Video Start Time: 12/12/2017 20:58:00  Symptoms: L face numbness NIHSS Start Assessment Time: 12/12/2017 21:00:00 Patient is not a candidate for tPA. Patient was not deemed candidate for tPA thrombolytics because of NIHSS 0. Video End Time: 12/12/2017 21:06:00  CT head showed no acute hemorrhage or acute core infarct. CT head was reviewed.  Advanced imaging was not obtained as the presentation was not suggestive of Large Vessel Occlusive Disease.   Radiologist was not called back for review of advanced imaging because n/a ER Physician notified of the decision on thrombolytics management on 12/12/2017 21:09:25  Our recommendations are outlined below.  Recommendations:     .  Activate Stroke Protocol Admission/Order Set     .  Stroke/Telemetry Floor     .  Neuro Checks     .  Bedside Swallow Eval     .  DVT Prophylaxis     .  IV Fluids, Normal Saline     .  Head of Bed Below 30 Degrees     .  Euglycemia and Avoid Hyperthermia (PRN Acetaminophen)     .  Initiate Aspirin 325 MG Daily     .  Dysphagia screen, DVT prophylaxis  Routine Consultation with [facility] for Follow up Care  Sign Out:     .  Discussed with Emergency Department Provider    ------------------------------------------------------------------------------  History of Present Illness: Patient is a 57 year old Female.  Patient was brought by  private transportation with symptoms of L face numbness  57 yo was in usual state of health this evening, in car with husband when at 2000 sudden onset of shoulder pain followed by L face numbness and tingling. No arm or leg symptoms of numbness. No face droop noted by husband, but patient states L side of face feels different. She denies any speech or vision changes. They drove to ER as they were in the car. Since arrival symptoms have improved but not resolved.  CT head showed no acute hemorrhage or acute core infarct. CT head was reviewed.    Examination: 1A: Level of Consciousness - Alert; keenly responsive + 0 1B: Ask Month and Age - Both Questions Right + 0 1C: Blink Eyes & Squeeze Hands - Performs Both Tasks + 0 2: Test Horizontal Extraocular Movements - Normal + 0 3: Test Visual Fields - No Visual Loss + 0 4: Test Facial Palsy (Use Grimace if Obtunded) - Normal symmetry + 0 5A: Test Left Arm Motor Drift - No Drift for 10 Seconds + 0 5B: Test Right Arm Motor Drift - No Drift for 10 Seconds + 0 6A: Test Left Leg Motor Drift - No Drift for 5 Seconds + 0 6B: Test Right Leg Motor Drift - No Drift for 5 Seconds + 0 7: Test Limb Ataxia (FNF/Heel-Shin) - No Ataxia + 0 8: Test Sensation - Normal; No sensory loss + 0 9: Test Language/Aphasia - Normal; No aphasia + 0 10: Test Dysarthria - Normal + 0 11:  Test Extinction/Inattention - No abnormality + 0  NIHSS Score: 0  Patient was informed the Neurology Consult would happen via TeleHealth consult by way of interactive audio and video telecommunications and consented to receiving care in this manner.  Due to the immediate potential for life-threatening deterioration due to underlying acute neurologic illness, I spent 35 minutes providing critical care. This time includes time for face to face visit via telemedicine, review of medical records, imaging studies and discussion of findings with providers, the patient and/or family.   Dr Jene Every   [facility] [phone]

## 2017-12-12 NOTE — ED Triage Notes (Signed)
Pt reports driving back from [place] with spouse and suddenly "felt funny" (brief shoulder pain), and left facial tingling/numbness. No other deficits present at this time. Dr Laverta Baltimore is at bedside. These symptoms started about 15 minutes ago.

## 2017-12-12 NOTE — Progress Notes (Signed)
Left side weakness that started 1-2 hours ago.   Code stroke protocol [facility]  DR.Nanda Quinton  [phone] Exam calll time 20:30 Exam beeper 20:30 Exam started 20:34 Exam finished 20;38 Exam images sent to [facility], completed and [facility] called 20:43

## 2017-12-12 NOTE — ED Notes (Signed)
Neuro nurse, Chryl Heck RN called back -Pt taken to CT with Berline Lopes, RN at this time.

## 2017-12-12 NOTE — H&P (Signed)
History and Physical    Allison Frazier JIR:[id number] DOB: [birth year] DOA: 12/12/2017  PCP: Sharilyn Sites, MD   Patient coming from: Home   Chief Complaint: Left facial numbness  HPI: Allison Frazier is a 57 y.o. female with medical history significant for obesity (BMI 84) and hyperlipidemia with statin intolerance, now presenting to the emergency department for evaluation of left facial numbness.  Patient reports that she was in her usual state of health, having an uneventful day, and then developed numbness and tingling in the left side of her face approximately 15 minutes prior to arrival in the ED.  Symptoms have begun to improve.  She denies any change in vision, hearing, focal weakness, chest pain, or palpitations.  She has never experienced this previously.  She had an episode of near syncope 1 month ago that was evaluated in the emergency department, reportedly with normal labs, and there has not been any recurrence.  ED Course: Upon arrival to the ED, patient is found to be saturating well on room air and with other vitals normal.  EKG features a sinus rhythm and noncontrast head CT is a normal study.  Chemistry panel and CBC are unremarkable and troponin is undetectable.  Patient was evaluated by tele-neurology in the ED with recommendation for inpatient work-up of possible TIA/CVA.  Review of Systems:  All other systems reviewed and apart from HPI, are negative.  Past Medical History:  Diagnosis Date  . Chondromalacia of both patellae   . High cholesterol     Past Surgical History:  Procedure Laterality Date  . CESAREAN SECTION  1996  . CESAREAN SECTION  1998  . ENDOMETRIAL ABLATION  04/04/07   HER OPTION  . LASIX     REFRACTIVE SURGERY-  RIGHT EYE  . TUBAL LIGATION  1998     reports that she has never smoked. She has never used smokeless tobacco. She reports that she drinks alcohol. She reports that she does not use drugs.  Allergies  Allergen Reactions  .  Simvastatin Other (See Comments)    BODY ACHES    Family History  Problem Relation Age of Onset  . Heart disease Father   . Cancer Father        Prostate     Prior to Admission medications   Medication Sig Start Date End Date Taking? Authorizing Provider  aspirin EC 81 MG tablet Take 81 mg by mouth daily.     [provider]  cetirizine (ZYRTEC) 10 MG tablet Take 10 mg by mouth daily. Reported on 04/02/2015    [provider]  doxylamine, Sleep, (UNISOM) 25 MG tablet Take 25 mg by mouth at bedtime as needed for sleep.    [provider]  estradiol (ESTRACE) 1 MG tablet Take 1 tablet (1 mg total) by mouth daily. 09/07/17   Princess Bruins, MD  ondansetron (ZOFRAN) 4 MG tablet Take 1 tablet (4 mg total) by mouth every 6 (six) hours. 07/12/17   Virgel Manifold, MD  ondansetron (ZOFRAN) 4 MG tablet Take 1 tablet (4 mg total) by mouth every 8 (eight) hours as needed for nausea or vomiting. 07/12/17   Virgel Manifold, MD  oxyCODONE-acetaminophen (PERCOCET/ROXICET) 5-325 MG tablet Take 1-2 tablets by mouth every 4 (four) hours as needed. 07/12/17   Virgel Manifold, MD  oxyCODONE-acetaminophen (PERCOCET/ROXICET) 5-325 MG tablet Take 2 tablets by mouth every 4 (four) hours as needed for severe pain. 07/12/17   Virgel Manifold, MD  Phenyleph-Doxylamine-DM-APAP (SINEX SEVERE+ VAPOCOOL PO) Place  1 application into the nose as needed.    [provider]  progesterone (PROMETRIUM) 100 MG capsule Take one tablet daily at bedtime 09/07/17   Princess Bruins, MD    Physical Exam: Vitals:   12/12/17 2042 12/12/17 2045 12/12/17 2100 12/12/17 2115  BP:  (!) 158/59 (!) 144/67 139/74  Pulse:  99 100 81  Resp:  17 18 19   SpO2:  98% 98% 96%  Weight: 119.3 kg     Height:        Constitutional: NAD, calm  Eyes: PERTLA, lids and conjunctivae normal ENMT: Mucous membranes are moist. Posterior pharynx clear of any exudate or lesions.   Neck: normal, supple, no masses, no  thyromegaly Respiratory: clear to auscultation bilaterally, no wheezing, no crackles. Normal respiratory effort.    Cardiovascular: S1 & S2 heard, regular rate and rhythm. No extremity edema.   Abdomen: No distension, no tenderness, soft. Bowel sounds normal.  Musculoskeletal: no clubbing / cyanosis. No joint deformity upper and lower extremities.   Skin: no significant rashes, lesions, ulcers. Warm, dry, well-perfused. Neurologic: Left facial numbness, CN 2-12 grossly intact otherwise. Patellar DTR's intact. Strength 5/5 in all 4 limbs.  Psychiatric: Alert and oriented x 3. Calm, cooperative.    Labs on Admission: I have personally reviewed following labs and imaging studies  CBC: Recent Labs  Lab 12/12/17 2036 12/12/17 2041  WBC 7.0  --   NEUTROABS 3.8  --   HGB 14.1 15.0  HCT 43.9 44.0  MCV 88.9  --   PLT 273  --    Basic Metabolic Panel: Recent Labs  Lab 12/12/17 2036 12/12/17 2041  NA 139 140  K 3.5 3.6  CL 105 105  CO2 26  --   GLUCOSE 121* 119*  BUN 19 20  CREATININE 0.78 0.70  CALCIUM 10.1  --    GFR: Estimated Creatinine Clearance: 101.5 mL/min (by C-G formula based on SCr of 0.7 mg/dL). Liver Function Tests: Recent Labs  Lab 12/12/17 2036  AST 22  ALT 23  ALKPHOS 63  BILITOT 0.7  PROT 7.3  ALBUMIN 4.3   No results for input(s): LIPASE, AMYLASE in the last 168 hours. No results for input(s): AMMONIA in the last 168 hours. Coagulation Profile: Recent Labs  Lab 12/12/17 2036  INR 0.96   Cardiac Enzymes: No results for input(s): CKTOTAL, CKMB, CKMBINDEX, TROPONINI in the last 168 hours. BNP (last 3 results) No results for input(s): PROBNP in the last 8760 hours. HbA1C: No results for input(s): HGBA1C in the last 72 hours. CBG: Recent Labs  Lab 12/12/17 2032  GLUCAP 120*   Lipid Profile: No results for input(s): CHOL, HDL, LDLCALC, TRIG, CHOLHDL, LDLDIRECT in the last 72 hours. Thyroid Function Tests: No results for input(s): TSH,  T4TOTAL, FREET4, T3FREE, THYROIDAB in the last 72 hours. Anemia Panel: No results for input(s): VITAMINB12, FOLATE, FERRITIN, TIBC, IRON, RETICCTPCT in the last 72 hours. Urine analysis:    Component Value Date/Time   COLORURINE YELLOW 12/12/2017 2040   APPEARANCEUR CLOUDY (A) 12/12/2017 2040   LABSPEC 1.015 12/12/2017 2040   PHURINE 6.0 12/12/2017 2040   GLUCOSEU NEGATIVE 12/12/2017 2040   HGBUR NEGATIVE 12/12/2017 2040   BILIRUBINUR NEGATIVE 12/12/2017 2040   BILIRUBINUR neg 12/12/2016 [place] 12/12/2017 2040   PROTEINUR NEGATIVE 12/12/2017 2040   UROBILINOGEN 0.2 12/12/2016 1533   UROBILINOGEN 0.2 05/31/2013 1602   NITRITE NEGATIVE 12/12/2017 2040   LEUKOCYTESUR MODERATE (A) 12/12/2017 2040   Sepsis Labs: @LABRCNTIP (procalcitonin:4,lacticidven:4) )  No results found for this or any previous visit (from the past 240 hour(s)).   Radiological Exams on Admission: Ct Head Code Stroke Wo Contrast  Result Date: 12/12/2017 CLINICAL DATA:  Code stroke.  Left-sided weakness EXAM: CT HEAD WITHOUT CONTRAST TECHNIQUE: Contiguous axial images were obtained from the base of the skull through the vertex without intravenous contrast. COMPARISON:  05/10/2007 head CT FINDINGS: Bra Brain: There is no mass, hemorrhage or extra-axial collection. The size and configuration of the ventricles and extra-axial CSF spaces are normal. There is no acute or chronic infarction. The brain parenchyma is normal. Vascular: No abnormal hyperdensity of the major intracranial arteries or dural venous sinuses. No intracranial atherosclerosis. Skull: The visualized skull base, calvarium and extracranial soft tissues are normal. Sinuses/Orbits: No fluid levels or advanced mucosal thickening of the visualized paranasal sinuses. No mastoid or middle ear effusion. The orbits are normal. ASPECTS [facility] Stroke Program Early CT Score) - Ganglionic level infarction (caudate, lentiform nuclei, internal capsule,  insula, M1-M3 cortex): 7 - Supraganglionic infarction (M4-M6 cortex): 3 Total score (0-10 with 10 being normal): 10 IMPRESSION: 1. Normal head CT. 2. ASPECTS is 10. These results were called by telephone at the time of interpretation on 12/12/2017 at 9:01 pm to Dr. Nanda Quinton , who verbally acknowledged these results. Electronically Signed   By: Ulyses Jarred M.D.   On: 12/12/2017 21:01    EKG: Independently reviewed. Sinus rhythm.   Assessment/Plan   1. Acute left facial numbness  - Presents with acute-onset of left facial numbness just pta, improving in ED  - Head CT is a normal study and basic labs and EKG unremarkable  - Continue cardiac monitoring, frequent neuro checks, swallow screen, PT/OT/SLP consultations  - Check MRI brain, MRA head, carotid US, echocardiogram, fasting lipids, and A1c  - Start ASA 325; she reports intolerance to statins     DVT prophylaxis: Lovenox  Code Status: Full  Family Communication: Family updated at bedside Consults called: Teleneurology  Admission status: Observation     Vianne Bulls, MD [facility] Pager [phone]  If 7PM-7AM, please contact night-coverage [email or web address] Password [facility]  12/12/2017, 9:58 PM

## 2017-12-13 ENCOUNTER — Encounter (HOSPITAL_COMMUNITY): Payer: Self-pay

## 2017-12-13 ENCOUNTER — Observation Stay (HOSPITAL_BASED_OUTPATIENT_CLINIC_OR_DEPARTMENT_OTHER): Payer: Commercial Managed Care - PPO

## 2017-12-13 ENCOUNTER — Observation Stay (HOSPITAL_COMMUNITY): Payer: Commercial Managed Care - PPO

## 2017-12-13 DIAGNOSIS — G459 Transient cerebral ischemic attack, unspecified: Secondary | ICD-10-CM | POA: Diagnosis not present

## 2017-12-13 DIAGNOSIS — R29818 Other symptoms and signs involving the nervous system: Secondary | ICD-10-CM

## 2017-12-13 DIAGNOSIS — R29810 Facial weakness: Secondary | ICD-10-CM | POA: Diagnosis not present

## 2017-12-13 DIAGNOSIS — Z6841 Body Mass Index (BMI) 40.0 and over, adult: Secondary | ICD-10-CM | POA: Diagnosis not present

## 2017-12-13 DIAGNOSIS — R200 Anesthesia of skin: Secondary | ICD-10-CM

## 2017-12-13 HISTORY — DX: Anesthesia of skin: R20.0

## 2017-12-13 LAB — LIPID PANEL
CHOL/HDL RATIO: 4.1 ratio
Cholesterol: 215 mg/dL — ABNORMAL HIGH (ref 0–200)
HDL: 52 mg/dL (ref >40)
LDL CALC: 141 mg/dL — AB (ref 0–99)
TRIGLYCERIDES: 111 mg/dL (ref <150)
VLDL: 22 mg/dL (ref 0–40)

## 2017-12-13 LAB — ECHOCARDIOGRAM COMPLETE
Height: 65 in
Weight: 4206.4 oz

## 2017-12-13 LAB — HEMOGLOBIN A1C
HEMOGLOBIN A1C: 5.1 % (ref 4.8–5.6)
MEAN PLASMA GLUCOSE: 99.67 mg/dL

## 2017-12-13 MED ORDER — ASPIRIN 325 MG PO TABS
ORAL_TABLET | ORAL | Status: AC
  Filled 2017-12-13: qty 1

## 2017-12-13 MED ORDER — PRAVASTATIN SODIUM 10 MG PO TABS: 10.0000 mg | ORAL_TABLET | Freq: Every day | ORAL | 4 refills | Status: DC

## 2017-12-13 MED ORDER — ASPIRIN 325 MG PO TABS: 325.0000 mg | ORAL_TABLET | Freq: Every day | ORAL | Status: DC

## 2017-12-13 NOTE — ED Notes (Signed)
Pt up to restroom at this time with steady and even gait.

## 2017-12-13 NOTE — Discharge Summary (Signed)
Allison Frazier, is a 57 y.o. female  DOB [birth year]  MRN [id number].  Admission date:  12/12/2017  Admitting Physician  Vianne Bulls, MD  Discharge Date:  12/13/2017   Primary MD  Sharilyn Sites, MD  Recommendations for primary care physician for things to follow:   1)Take Full dose aspirin daily with food instead of baby aspirin due to concerns about possible mini stroke/TIA 2)Take low-dose pravastatin 10 mg evening due to concerns of a mini stroke and elevated bad cholesterol 3)Consider decreasing your estradiol to 0.5 mg from 1 mg and slowly tapering off your hormone replacement therapy at home replacement therapy is associated with increased risk of strokes and blood clots/thromboembolic events 4) talk to your primary care physician tomorrow about management of your hormone replacement therapy 5) you are taking full dose aspirin which is a blood thinner so Avoid ibuprofen/Advil/Aleve/Motrin/Goody Powders/Naproxen/BC powders/Meloxicam/Diclofenac/Indomethacin and other Nonsteroidal anti-inflammatory medications as these will make you more likely to bleed and can cause stomach ulcers, can also cause Kidney problems.  6) follow-up with your primary care physician within the next 72 hours for recheck  Admission Diagnosis  numbness   Discharge Diagnosis  numbness    Principal Problem:   Acute focal neurological deficit Active Problems:   Left facial numbness   Acute focal neurologic deficit with partial resolution      Past Medical History:  Diagnosis Date  . Chondromalacia of both patellae   . High cholesterol     Past Surgical History:  Procedure Laterality Date  . CESAREAN SECTION  1996  . CESAREAN SECTION  1998  . ENDOMETRIAL ABLATION  04/04/07   HER OPTION  . LASIX     REFRACTIVE SURGERY-  RIGHT EYE  . TUBAL LIGATION  1998    HPI  from the history and physical done on the day of  admission:   Chief Complaint: Left facial numbness  HPI: Allison Frazier is a 57 y.o. female with medical history significant for obesity (BMI 44) and hyperlipidemia with statin intolerance, now presenting to the emergency department for evaluation of left facial numbness.  Patient reports that she was in her usual state of health, having an uneventful day, and then developed numbness and tingling in the left side of her face approximately 15 minutes prior to arrival in the ED.  Symptoms have begun to improve.  She denies any change in vision, hearing, focal weakness, chest pain, or palpitations.  She has never experienced this previously.  She had an episode of near syncope 1 month ago that was evaluated in the emergency department, reportedly with normal labs, and there has not been any recurrence.  ED Course: Upon arrival to the ED, patient is found to be saturating well on room air and with other vitals normal.  EKG features a sinus rhythm and noncontrast head CT is a normal study.  Chemistry panel and CBC are unremarkable and troponin is undetectable.  Patient was evaluated by tele-neurology in the ED with recommendation for inpatient work-up of  possible TIA/CVA.   Hospital Course:   Brief summary: 57 y.o. female with medical history significant for morbid obesity (BMI 44) and hyperlipidemia with statin intolerance, who is on hormone replacement therapy admitted 12/12/2017 to obstetrics with concerns about possible TIA due to left facial numbness--- left-sided facial numbness resolved within a couple of hours no other neuro concerns   PlaN:- 1) Acute episode left-sided facial numbness--- lasted 2 hours also now resolved completely no further neuro concerns.... CT head without acute findings, carotid artery Doppler without hemodynamically significant stenosis, echocardiogram without intracardiac thrombus, EF is preserved at 55 to 60% without any wall motion abnormalities.  A1c pending, LDL is  141 with a total cholesterol of 215 HDL is good at 52.  Patient reports history of intolerance to simvastatin in the past she is willing to try low-dose pravastatin at 10 mg daily, prior to admission patient was on aspirin 81 mg daily, she is willing to go up to 325 mg daily of aspirin with food . Serial Neuro Exam is reassuring.  Patient uses estrogen/progesterone hormone replacement therapy for hot flashes, she is advised to decrease the dose to half dose and then slowly wean off HRT due to concerns about TIA/stroke risk... She will discuss further with her PCP regarding taper of hormone replacement therapy... A1c is pending.  TSH is normal at 1.4  2) morbid obesity--- BMI 44, lifestyle and dietary modifications discussed  3)HRT--- Hormone replacement therapy please see #1 above  4)Hyperglycemia----A1c pending, patient follow-up with PCP regarding fasting glucose check and follow-up on A1c result   Discharge Condition: stable  Follow UP--- PCP for recheck within 72 hours  Consults obtained -telemetry neurology  Diet and Activity recommendation:  As advised  Discharge Instructions  -----plan of care discussed with patient and her husband at bedside  Discharge Instructions    Call MD for:  difficulty breathing, headache or visual disturbances   Complete by:  As directed    Call MD for:  persistant dizziness or light-headedness   Complete by:  As directed    Call MD for:  persistant nausea and vomiting   Complete by:  As directed    Call MD for:  severe uncontrolled pain   Complete by:  As directed    Call MD for:  temperature >100.4   Complete by:  As directed    Diet - low sodium heart healthy   Complete by:  As directed    Discharge instructions   Complete by:  As directed    1)Take Full dose aspirin daily with food instead of baby aspirin due to concerns about possible mini stroke/TIA 2)Take low-dose pravastatin 10 mg evening due to concerns of a mini stroke and elevated bad  cholesterol 3)Consider decreasing your estradiol to 0.5 mg from 1 mg and slowly tapering off your hormone replacement therapy at home replacement therapy is associated with increased risk of strokes and blood clots/thromboembolic events 4) talk to your primary care physician tomorrow about management of your hormone replacement therapy 5) you are taking full dose aspirin which is a blood thinner so Avoid ibuprofen/Advil/Aleve/Motrin/Goody Powders/Naproxen/BC powders/Meloxicam/Diclofenac/Indomethacin and other Nonsteroidal anti-inflammatory medications as these will make you more likely to bleed and can cause stomach ulcers, can also cause Kidney problems.  6) follow-up with your primary care physician within the next 72 hours for recheck   Increase activity slowly   Complete by:  As directed         Discharge Medications     Allergies  as of 12/13/2017      Reactions   Simvastatin Other (See Comments)   BODY ACHES      Medication List    STOP taking these medications   aspirin EC 81 MG tablet     TAKE these medications   doxylamine (Sleep) 25 MG tablet Commonly known as:  UNISOM Take 25 mg by mouth at bedtime.   estradiol 1 MG tablet Commonly known as:  ESTRACE Take 1 tablet (1 mg total) by mouth daily.   pravastatin 10 MG tablet Commonly known as:  PRAVACHOL Take 1 tablet (10 mg total) by mouth daily.   progesterone 100 MG capsule Commonly known as:  PROMETRIUM Take one tablet daily at bedtime   SINEX SEVERE+ VAPOCOOL PO Place 1 application into the nose daily.       Major procedures and Radiology Reports - PLEASE review detailed and final reports for all details, in brief -   US Carotid Bilateral  Result Date: 12/13/2017 CLINICAL DATA:  57 year old female with a history of facial weakness EXAM: BILATERAL CAROTID DUPLEX ULTRASOUND TECHNIQUE: Pearline Cables scale imaging, color Doppler and duplex ultrasound were performed of bilateral carotid and vertebral arteries in the  neck. COMPARISON:  None. FINDINGS: Criteria: Quantification of carotid stenosis is based on velocity parameters that correlate the residual internal carotid diameter with NASCET-based stenosis levels, using the diameter of the distal internal carotid lumen as the denominator for stenosis measurement. The following velocity measurements were obtained: RIGHT ICA:  Systolic 81 cm/sec, Diastolic 25 cm/sec CCA:  962 cm/sec SYSTOLIC ICA/CCA RATIO:  0.8 ECA:  79 cm/sec LEFT ICA:  Systolic 99 cm/sec, Diastolic 34 cm/sec CCA:  95 cm/sec SYSTOLIC ICA/CCA RATIO:  1.0 ECA:  99 cm/sec Right Brachial SBP: Not acquired Left Brachial SBP: Not acquired RIGHT CAROTID ARTERY: No significant calcified disease of the right common carotid artery. Intermediate waveform maintained. Homogeneous plaque without significant calcifications at the right carotid bifurcation. Low resistance waveform of the right ICA. No significant tortuosity. RIGHT VERTEBRAL ARTERY: Antegrade flow with low resistance waveform. LEFT CAROTID ARTERY: No significant calcified disease of the left common carotid artery. Intermediate waveform maintained. Homogeneous plaque at the left carotid bifurcation without significant calcifications. Low resistance waveform of the left ICA. LEFT VERTEBRAL ARTERY:  Antegrade flow with low resistance waveform. IMPRESSION: Color duplex indicates minimal homogeneous plaque, with no hemodynamically significant stenosis by duplex criteria in the extracranial cerebrovascular circulation. Signed, Dulcy Fanny. Dellia Nims, RPVI [facility] Electronically Signed   By: Corrie Mckusick D.O.   On: 12/13/2017 10:22   Ct Head Code Stroke Wo Contrast  Result Date: 12/12/2017 CLINICAL DATA:  Code stroke.  Left-sided weakness EXAM: CT HEAD WITHOUT CONTRAST TECHNIQUE: Contiguous axial images were obtained from the base of the skull through the vertex without intravenous contrast. COMPARISON:   05/10/2007 head CT FINDINGS: Bra Brain: There is no mass, hemorrhage or extra-axial collection. The size and configuration of the ventricles and extra-axial CSF spaces are normal. There is no acute or chronic infarction. The brain parenchyma is normal. Vascular: No abnormal hyperdensity of the major intracranial arteries or dural venous sinuses. No intracranial atherosclerosis. Skull: The visualized skull base, calvarium and extracranial soft tissues are normal. Sinuses/Orbits: No fluid levels or advanced mucosal thickening of the visualized paranasal sinuses. No mastoid or middle ear effusion. The orbits are normal. ASPECTS [facility] Stroke Program Early CT Score) - Ganglionic level infarction (caudate, lentiform nuclei, internal capsule, insula, M1-M3 cortex): 7 - Supraganglionic infarction (  M4-M6 cortex): 3 Total score (0-10 with 10 being normal): 10 IMPRESSION: 1. Normal head CT. 2. ASPECTS is 10. These results were called by telephone at the time of interpretation on 12/12/2017 at 9:01 pm to Dr. Nanda Quinton , who verbally acknowledged these results. Electronically Signed   By: Ulyses Jarred M.D.   On: 12/12/2017 21:01    Micro Results   Today   Subjective    Allison Frazier today has no concerns, no headaches, no visual disturbance, no speech problems, no further facial numbness, no focal extremity weakness or numbness, no gait concerns, no balance issues, no tremors  No chest pains, no palpitations no dizziness no pleuritic symptoms no leg pains or leg swelling  Patient says she has felt back to normal/baseline throughout the ED stay she said left facial numbness was isolated without other neuro symptoms and the left sided facial numbness only lasted a couple of hours and that resolved completely          Patient has been seen and examined prior to discharge   Objective   Blood pressure (!) 114/48, pulse 70, resp. rate (!) 24, height 5\' 5"  (1.651 m), weight 119.3 kg, last menstrual period  12/29/2012, SpO2 98 %.   Intake/Output Summary (Last 24 hours) at 12/13/2017 1350 Last data filed at 12/13/2017 1217 Gross per 24 hour  Intake 880.07 ml  Output -  Net 880.07 ml    Exam Gen:- Awake Alert, obese obese in no acute distress pleasant HEENT:- [place].AT, No sclera icterus Neck-Supple Neck,No JVD,.  Lungs-  CTAB , good air movement CV- S1, S2 normal, regular Abd-  +ve B.Sounds, Abd Soft, No tenderness,   increased truncal adiposity Extremity/Skin:- No  edema,   good pulses Psych-affect is appropriate, oriented x3 Neuro-no new focal deficits, no tremors, overall serial neuro examination remains reassuring without any new acute focal motor or sensory deficits   Data Review   CBC w Diff:  Lab Results  Component Value Date   WBC 7.0 12/12/2017   HGB 15.0 12/12/2017   HCT 44.0 12/12/2017   PLT 273 12/12/2017   LYMPHOPCT 35 12/12/2017   MONOPCT 7 12/12/2017   EOSPCT 3 12/12/2017   BASOPCT 1 12/12/2017    CMP:  Lab Results  Component Value Date   NA 140 12/12/2017   K 3.6 12/12/2017   CL 105 12/12/2017   CO2 26 12/12/2017   BUN 20 12/12/2017   CREATININE 0.70 12/12/2017   CREATININE 0.60 04/02/2015   PROT 7.3 12/12/2017   ALBUMIN 4.3 12/12/2017   BILITOT 0.7 12/12/2017   ALKPHOS 63 12/12/2017   AST 22 12/12/2017   ALT 23 12/12/2017   Total Discharge time is about 33 minutes  Ancelmo Hunt M.D on 12/13/2017 at 1:50 PM  Pager---[phone]  Go to [email or web address] - password [email or web address] for contact info  [facility] - Office  [phone]

## 2017-12-13 NOTE — Progress Notes (Signed)
  Echocardiogram 2D Echocardiogram has been performed.  Allison Frazier 12/13/2017, 10:49 AM

## 2017-12-13 NOTE — Therapy (Signed)
PT SCREEN  Pt reporting that she feels back to her baseline. She states that she just walked to the bathroom and the doctor had her standing doing a bunch of tests, including SLS and with her EC. She does not need or want therapy services acutely or once ready to go home. Pt screened and no needs identified. Signing off acutely for now.   Geraldine Solar PT, DPT

## 2017-12-13 NOTE — Discharge Instructions (Signed)
1)Take Full dose aspirin daily with food instead of baby aspirin due to concerns about possible mini stroke/TIA 2)Take low-dose pravastatin 10 mg evening due to concerns of a mini stroke and elevated bad cholesterol 3)Consider decreasing your estradiol to 0.5 mg from 1 mg and slowly tapering off your hormone replacement therapy at home replacement therapy is associated with increased risk of strokes and blood clots/thromboembolic events 4) talk to your primary care physician tomorrow about management of your hormone replacement therapy 5) you are taking full dose aspirin which is a blood thinner so Avoid ibuprofen/Advil/Aleve/Motrin/Goody Powders/Naproxen/BC powders/Meloxicam/Diclofenac/Indomethacin and other Nonsteroidal anti-inflammatory medications as these will make you more likely to bleed and can cause stomach ulcers, can also cause Kidney problems.  6) follow-up with your primary care physician within the next 72 hours for recheck

## 2017-12-14 LAB — HIV ANTIBODY (ROUTINE TESTING W REFLEX): HIV Screen 4th Generation wRfx: NONREACTIVE

## 2017-12-24 DIAGNOSIS — M25552 Pain in left hip: Secondary | ICD-10-CM | POA: Diagnosis not present

## 2018-01-02 DIAGNOSIS — M25552 Pain in left hip: Secondary | ICD-10-CM | POA: Diagnosis not present

## 2018-01-06 DIAGNOSIS — M25552 Pain in left hip: Secondary | ICD-10-CM | POA: Diagnosis not present

## 2018-01-06 DIAGNOSIS — M1612 Unilateral primary osteoarthritis, left hip: Secondary | ICD-10-CM | POA: Insufficient documentation

## 2018-03-31 ENCOUNTER — Encounter: Payer: Self-pay | Admitting: Obstetrics & Gynecology

## 2018-03-31 DIAGNOSIS — Z1231 Encounter for screening mammogram for malignant neoplasm of breast: Secondary | ICD-10-CM | POA: Diagnosis not present

## 2018-04-19 DIAGNOSIS — J019 Acute sinusitis, unspecified: Secondary | ICD-10-CM | POA: Diagnosis not present

## 2018-04-19 DIAGNOSIS — H6993 Unspecified Eustachian tube disorder, bilateral: Secondary | ICD-10-CM | POA: Diagnosis not present

## 2018-04-19 DIAGNOSIS — Z1389 Encounter for screening for other disorder: Secondary | ICD-10-CM | POA: Diagnosis not present

## 2018-05-18 DIAGNOSIS — J0100 Acute maxillary sinusitis, unspecified: Secondary | ICD-10-CM | POA: Diagnosis not present

## 2018-05-18 DIAGNOSIS — Z1389 Encounter for screening for other disorder: Secondary | ICD-10-CM | POA: Diagnosis not present

## 2018-05-18 DIAGNOSIS — Z6841 Body Mass Index (BMI) 40.0 and over, adult: Secondary | ICD-10-CM | POA: Diagnosis not present

## 2018-06-02 DIAGNOSIS — Z6841 Body Mass Index (BMI) 40.0 and over, adult: Secondary | ICD-10-CM | POA: Diagnosis not present

## 2018-06-02 DIAGNOSIS — Z1389 Encounter for screening for other disorder: Secondary | ICD-10-CM | POA: Diagnosis not present

## 2018-06-02 DIAGNOSIS — R350 Frequency of micturition: Secondary | ICD-10-CM | POA: Diagnosis not present

## 2018-06-02 DIAGNOSIS — N342 Other urethritis: Secondary | ICD-10-CM | POA: Diagnosis not present

## 2018-09-08 ENCOUNTER — Other Ambulatory Visit: Payer: Self-pay

## 2018-09-09 ENCOUNTER — Ambulatory Visit (INDEPENDENT_AMBULATORY_CARE_PROVIDER_SITE_OTHER): Payer: Commercial Managed Care - PPO | Admitting: Obstetrics & Gynecology

## 2018-09-09 ENCOUNTER — Encounter: Payer: Self-pay | Admitting: Obstetrics & Gynecology

## 2018-09-09 VITALS — BP 138/86 | Ht 64.5 in | Wt 256.0 lb

## 2018-09-09 DIAGNOSIS — M8589 Other specified disorders of bone density and structure, multiple sites: Secondary | ICD-10-CM

## 2018-09-09 DIAGNOSIS — Z01419 Encounter for gynecological examination (general) (routine) without abnormal findings: Secondary | ICD-10-CM

## 2018-09-09 DIAGNOSIS — Z79890 Hormone replacement therapy (postmenopausal): Secondary | ICD-10-CM

## 2018-09-09 DIAGNOSIS — Z6841 Body Mass Index (BMI) 40.0 and over, adult: Secondary | ICD-10-CM

## 2018-09-09 MED ORDER — PROGESTERONE MICRONIZED 100 MG PO CAPS: ORAL_CAPSULE | ORAL | 4 refills | Status: DC

## 2018-09-09 MED ORDER — ESTRADIOL 1 MG PO TABS: 1.0000 mg | ORAL_TABLET | Freq: Every day | ORAL | 4 refills | Status: DC

## 2018-09-09 NOTE — Progress Notes (Signed)
Allison Frazier [birth year] [id number]   History:    58 y.o. G3P3L3 Married.  Oldest daughter has 3 children.    RP:  Established patient presenting for annual gyn exam   HPI: Menopause, well on HRT x 4 years.  Hot flushes and night sweats mostly controled on current dosage.  No PMB.  No pelvic pain.  No pain with IC.  Urine/BMs wnl.  Breasts wnl. BMI 43.26.  Physically active.  Health labs with Fam MD.  Past medical history,surgical history, family history and social history were all reviewed and documented in the EPIC chart.  Gynecologic History Patient's last menstrual period was 12/29/2012. Contraception: post menopausal status Last Pap: 08/2017. Results were: Negative Last mammogram: 03/2008. Results were: Negative Bone Density: 05/2016 Osteopneia Colonoscopy: 2013  Obstetric History OB History  Gravida Para Term Preterm AB Living  3 3 3     3   SAB TAB Ectopic Multiple Live Births          3    # Outcome Date GA Lbr Len/2nd Weight Sex Delivery Anes PTL Lv  3 Term     F CS-Unspec  N LIV  2 Term     M CS-Unspec  N LIV  1 Term     F Vag-Spont  N LIV     ROS: A ROS was performed and pertinent positives and negatives are included in the history.  GENERAL: No fevers or chills. HEENT: No change in vision, no earache, sore throat or sinus congestion. NECK: No pain or stiffness. CARDIOVASCULAR: No chest pain or pressure. No palpitations. PULMONARY: No shortness of breath, cough or wheeze. GASTROINTESTINAL: No abdominal pain, nausea, vomiting or diarrhea, melena or bright red blood per rectum. GENITOURINARY: No urinary frequency, urgency, hesitancy or dysuria. MUSCULOSKELETAL: No joint or muscle pain, no back pain, no recent trauma. DERMATOLOGIC: No rash, no itching, no lesions. ENDOCRINE: No polyuria, polydipsia, no heat or cold intolerance. No recent change in weight. HEMATOLOGICAL: No anemia or easy bruising or bleeding. NEUROLOGIC: No headache, seizures, numbness, tingling or  weakness. PSYCHIATRIC: No depression, no loss of interest in normal activity or change in sleep pattern.     Exam:   BP 138/86   Ht 5' 4.5" (1.638 m)   Wt 256 lb (116.1 kg)   LMP 12/29/2012   BMI 43.26 kg/m   Body mass index is 43.26 kg/m.  General appearance : Well developed well nourished female. No acute distress HEENT: Eyes: no retinal hemorrhage or exudates,  Neck supple, trachea midline, no carotid bruits, no thyroidmegaly Lungs: Clear to auscultation, no rhonchi or wheezes, or rib retractions  Heart: Regular rate and rhythm, no murmurs or gallops Breast:Examined in sitting and supine position were symmetrical in appearance, no palpable masses or tenderness,  no skin retraction, no nipple inversion, no nipple discharge, no skin discoloration, no axillary or supraclavicular lymphadenopathy Abdomen: no palpable masses or tenderness, no rebound or guarding Extremities: no edema or skin discoloration or tenderness  Pelvic: Vulva: Normal             Vagina: No gross lesions or discharge  Cervix: No gross lesions or discharge  Uterus  AV, normal size, shape and consistency, non-tender and mobile  Adnexa  Without masses or tenderness  Anus: Normal   Assessment/Plan:  58 y.o. female for annual exam   1. Well female exam with routine gynecological exam Normal gynecologic exam in menopause.  Pap test negative 08/2017, no indication to repeat this year.  Breast  exam normal.  Screening Mammo negative 03/2018.  Heatlh labs with Fam MD.  Harriet Masson 2013.  2. Post-menopause on HRT (hormone replacement therapy) Well on HRT.  No PMB.  No CI to continue.  HRT represcribed the same.  3. Osteopenia of multiple sites Will repeat a BD here now.  Vitamin D supplements, Ca++ intake of 1200 mg daily, regular weightbearing physical activities. - DG Bone Density; Future  4. Class 3 severe obesity due to excess calories without serious comorbidity with body mass index (BMI) of 40.0 to 44.9 in adult  [facility]) Recommend a low calorie/carb diet such as [employer].  Intermittent fasting discussed and recommended.  Aerobic activities 5x a week, weightlifting q2 days.  Other orders - aspirin EC 81 MG tablet; Take 81 mg by mouth daily. - fluticasone (FLONASE) 50 MCG/ACT nasal spray; Place into both nostrils daily. - estradiol (ESTRACE) 1 MG tablet; Take 1 tablet (1 mg total) by mouth daily. - progesterone (PROMETRIUM) 100 MG capsule; Take one tablet daily at bedtime  Princess Bruins MD, 9:13 AM 09/09/2018

## 2018-09-09 NOTE — Patient Instructions (Signed)
1. Well female exam with routine gynecological exam Normal gynecologic exam in menopause.  Pap test negative 08/2017, no indication to repeat this year.  Breast exam normal.  Screening Mammo negative 03/2018.  Heatlh labs with Fam MD.  Allison Frazier 2013.  2. Post-menopause on HRT (hormone replacement therapy) Well on HRT.  No PMB.  No CI to continue.  HRT represcribed the same.  3. Osteopenia of multiple sites Will repeat a BD here now.  Vitamin D supplements, Ca++ intake of 1200 mg daily, regular weightbearing physical activities. - DG Bone Density; Future  4. Class 3 severe obesity due to excess calories without serious comorbidity with body mass index (BMI) of 40.0 to 44.9 in adult [facility]) Recommend a low calorie/carb diet such as [employer].  Intermittent fasting discussed and recommended.  Aerobic activities 5x a week, weightlifting q2 days.  Other orders - aspirin EC 81 MG tablet; Take 81 mg by mouth daily. - fluticasone (FLONASE) 50 MCG/ACT nasal spray; Place into both nostrils daily. - estradiol (ESTRACE) 1 MG tablet; Take 1 tablet (1 mg total) by mouth daily. - progesterone (PROMETRIUM) 100 MG capsule; Take one tablet daily at bedtime  Allison Frazier, it was a pleasure seeing you today!

## 2018-09-27 ENCOUNTER — Other Ambulatory Visit: Payer: Self-pay

## 2018-09-28 ENCOUNTER — Ambulatory Visit (INDEPENDENT_AMBULATORY_CARE_PROVIDER_SITE_OTHER): Payer: Commercial Managed Care - PPO

## 2018-09-28 ENCOUNTER — Other Ambulatory Visit: Payer: Self-pay | Admitting: Obstetrics & Gynecology

## 2018-09-28 DIAGNOSIS — Z13820 Encounter for screening for osteoporosis: Secondary | ICD-10-CM

## 2018-09-28 DIAGNOSIS — Z780 Asymptomatic menopausal state: Secondary | ICD-10-CM

## 2018-09-28 DIAGNOSIS — M8589 Other specified disorders of bone density and structure, multiple sites: Secondary | ICD-10-CM

## 2018-11-29 ENCOUNTER — Telehealth: Payer: Self-pay | Admitting: *Deleted

## 2018-11-29 NOTE — Telephone Encounter (Signed)
Stop HRT and schedule Pelvic US.

## 2018-11-29 NOTE — Telephone Encounter (Signed)
Patient called and left message in triage voicemail c/o bright red light bleeding x 1 week states she is concerned. I called to explain she will need to schedule office visit, however her voicemail box is full. Not able to leave a message.

## 2018-11-29 NOTE — Telephone Encounter (Signed)
Patient called back in voice mail stating she missed a call. I called her back but got her voice mail box that was full and I could not leave a message.

## 2018-11-29 NOTE — Telephone Encounter (Signed)
Patient takes daily Estradiol and Prometrium.  She has been spotting/light bleeding x 7 days.  She said she has not bled in years. I advised her she will need to come in to have this assessed but wanted to check with you to see what I should schedule for her appointment-wise.

## 2018-11-30 ENCOUNTER — Other Ambulatory Visit: Payer: Self-pay | Admitting: Obstetrics & Gynecology

## 2018-11-30 DIAGNOSIS — N950 Postmenopausal bleeding: Secondary | ICD-10-CM

## 2018-11-30 NOTE — Telephone Encounter (Signed)
Patient advised. She knows Allison Frazier will call her to schedule.

## 2018-12-20 ENCOUNTER — Other Ambulatory Visit: Payer: Self-pay

## 2018-12-21 ENCOUNTER — Encounter: Payer: Self-pay | Admitting: Obstetrics & Gynecology

## 2018-12-21 ENCOUNTER — Ambulatory Visit: Payer: Commercial Managed Care - PPO | Admitting: Obstetrics & Gynecology

## 2018-12-21 ENCOUNTER — Ambulatory Visit (INDEPENDENT_AMBULATORY_CARE_PROVIDER_SITE_OTHER): Payer: Commercial Managed Care - PPO

## 2018-12-21 ENCOUNTER — Other Ambulatory Visit: Payer: Self-pay | Admitting: Obstetrics & Gynecology

## 2018-12-21 VITALS — BP 134/76

## 2018-12-21 DIAGNOSIS — D252 Subserosal leiomyoma of uterus: Secondary | ICD-10-CM | POA: Diagnosis not present

## 2018-12-21 DIAGNOSIS — N840 Polyp of corpus uteri: Secondary | ICD-10-CM | POA: Diagnosis not present

## 2018-12-21 DIAGNOSIS — N950 Postmenopausal bleeding: Secondary | ICD-10-CM

## 2018-12-21 DIAGNOSIS — Z79890 Hormone replacement therapy (postmenopausal): Secondary | ICD-10-CM | POA: Diagnosis not present

## 2018-12-21 DIAGNOSIS — D251 Intramural leiomyoma of uterus: Secondary | ICD-10-CM

## 2018-12-21 DIAGNOSIS — N841 Polyp of cervix uteri: Secondary | ICD-10-CM

## 2018-12-21 NOTE — Patient Instructions (Signed)
1. Post-menopausal bleeding Postmenopausal bleeding while patient was on hormone replacement therapy.  No further bleeding after stopping hormones.  Endocervical polyp present by ultrasound and on gynecologic exam.  Endometrial lining was thin at 4.2 mm with no endometrial lesion seen by ultrasound.  Pelvic ultrasound findings discussed with patient and patient reassured.  2. Endocervical polyp Endocervical polyp removed after verbal consent.  Well-tolerated and no complication.  Specimen sent to pathology, pending results.  3. Post-menopause on HRT (hormone replacement therapy) Patient well without hormone replacement therapy.  Decision not to restart hormone replacement therapy.  Allison Frazier, it was a pleasure seeing you today!  I will inform you of your results as soon as they are available.

## 2018-12-21 NOTE — Progress Notes (Signed)
Allison Frazier 1960-05-29 [id number]        58 y.o.  G3P3L3 Married  RP: PMB x 5 days on HRT for Pelvic US  HPI: PMB x 5 days with very light flow on HRT x 4 yrs.  Stopped HRT and no bleeding since then.  No vasomotor menopausal Sx.  S/P Endometrial ablation.  BT/S at time of last C/S.  No pelvic pain.  No Postcoital bleeding.   OB History  Gravida Para Term Preterm AB Living  3 3 3     3   SAB TAB Ectopic Multiple Live Births          3    # Outcome Date GA Lbr Len/2nd Weight Sex Delivery Anes PTL Lv  3 Term     F CS-Unspec  N LIV  2 Term     M CS-Unspec  N LIV  1 Term     F Vag-Spont  N LIV    Past medical history,surgical history, problem list, medications, allergies, family history and social history were all reviewed and documented in the EPIC chart.   Directed ROS with pertinent positives and negatives documented in the history of present illness/assessment and plan.  Exam:  Vitals:   12/21/18 0903  BP: 134/76   General appearance:  Normal  Pelvic US today: T/V and T/A images.  Enlarged elongated uterus with several intramural and subserosal fibroids which are smaller than on previous scan.  The overall uterine size is measured at 10.55 x 6.05 x 3.04 cm.  6 fibroids were measured with the largest fibroid measured at 2.86 x 2.4 cm.  The endometrial lining was slightly irregular status post endometrial ablation, but thin at 4.2 mm with no obvious mass or abnormal blood flow.  Both ovaries (seen T/A) were normal in size with an atrophic appearance.  Small cystic structure in the right adnexa separate from the ovary measured at 1.2 x 0.8 cm.  The vaginal scan revealed a 1.3 x 1.2 x 0.8 cm echogenic mass within the cervical canal with a feeder vessel seen posteriorly most likely an endocervical polyp.  No free fluid in the posterior cutis sac.  Abdomen: Normal  Gynecologic exam: Vulva normal.  Speculum: Cervix with an endocervical polyp visible at the external os.  Vagina  normal.  No current bleeding.  Normal vaginal secretions.    Verbal consent obtained for endocervical polyp removal.  Betadine prep on the cervix.  Hurricaine spray on the cervix.  Polyp grasped with a [place] clamp.  Polyps removed with rotation of the clamp.  Small specimen sent to pathology.  Silver nitrate at the base of the polyp for hemostasis which was adequate.  Procedure well-tolerated by patient.  No complication.  Instruments removed.   Assessment/Plan:  58 y.o. G3P3003   1. Post-menopausal bleeding Postmenopausal bleeding while patient was on hormone replacement therapy.  No further bleeding after stopping hormones.  Endocervical polyp present by ultrasound and on gynecologic exam.  Endometrial lining was thin at 4.2 mm with no endometrial lesion seen by ultrasound.  Pelvic ultrasound findings discussed with patient and patient reassured.  2. Endocervical polyp Endocervical polyp removed after verbal consent.  Well-tolerated and no complication.  Specimen sent to pathology, pending results.  3. Post-menopause on HRT (hormone replacement therapy) Patient well without hormone replacement therapy.  Decision not to restart hormone replacement therapy.  Counseling on above issues and coordination of care more than 50% for 15 minutes.  Princess Bruins MD, 9:43 AM  12/21/2018     

## 2018-12-23 LAB — PATHOLOGY REPORT

## 2018-12-23 LAB — TISSUE SPECIMEN

## 2019-01-12 ENCOUNTER — Telehealth: Payer: Self-pay | Admitting: *Deleted

## 2019-01-12 NOTE — Telephone Encounter (Signed)
Patient called c/o increased hot flashes would like like to start back on HRT estradiol 1 mg and progesterone 100 mg. Okay to do this?

## 2019-01-17 NOTE — Telephone Encounter (Signed)
Yes, agree to restart HRT if no new medical condition.

## 2019-01-17 NOTE — Telephone Encounter (Signed)
Patient informed, no new health concerns

## 2019-04-06 ENCOUNTER — Encounter: Payer: Self-pay | Admitting: Obstetrics & Gynecology

## 2019-09-26 ENCOUNTER — Other Ambulatory Visit: Payer: Self-pay | Admitting: Obstetrics & Gynecology

## 2019-09-26 DIAGNOSIS — Z79890 Hormone replacement therapy (postmenopausal): Secondary | ICD-10-CM

## 2019-09-30 ENCOUNTER — Telehealth: Payer: Self-pay | Admitting: *Deleted

## 2019-09-30 MED ORDER — ESTRADIOL 1 MG PO TABS
1.0000 mg | ORAL_TABLET | Freq: Every day | ORAL | 3 refills | Status: DC
Start: 2019-09-30 — End: 2019-12-27

## 2019-09-30 MED ORDER — PROGESTERONE MICRONIZED 100 MG PO CAPS: 100.0000 mg | ORAL_CAPSULE | Freq: Every day | ORAL | 3 refills | Status: DC

## 2019-09-30 NOTE — Telephone Encounter (Signed)
Patient has annual exam scheduled on 12/13/19, called requesting refills on HRT estradiol 1 mg tablet and progesterone 100 mg. Per telephone encounter on 01/12/19. Dr.Lavoie was okay with patient restarting, refills sent until annual exam.

## 2019-10-24 ENCOUNTER — Other Ambulatory Visit: Payer: Self-pay | Admitting: Obstetrics & Gynecology

## 2019-12-12 ENCOUNTER — Encounter: Payer: Commercial Managed Care - PPO | Admitting: Obstetrics & Gynecology

## 2019-12-27 ENCOUNTER — Other Ambulatory Visit: Payer: Self-pay

## 2019-12-27 ENCOUNTER — Ambulatory Visit (INDEPENDENT_AMBULATORY_CARE_PROVIDER_SITE_OTHER): Payer: Commercial Managed Care - PPO | Admitting: Obstetrics & Gynecology

## 2019-12-27 ENCOUNTER — Encounter: Payer: Self-pay | Admitting: Obstetrics & Gynecology

## 2019-12-27 VITALS — BP 136/78 | Ht 64.5 in | Wt 263.0 lb

## 2019-12-27 DIAGNOSIS — Z6841 Body Mass Index (BMI) 40.0 and over, adult: Secondary | ICD-10-CM

## 2019-12-27 DIAGNOSIS — Z79890 Hormone replacement therapy (postmenopausal): Secondary | ICD-10-CM | POA: Diagnosis not present

## 2019-12-27 DIAGNOSIS — Z01419 Encounter for gynecological examination (general) (routine) without abnormal findings: Secondary | ICD-10-CM | POA: Diagnosis not present

## 2019-12-27 DIAGNOSIS — M85851 Other specified disorders of bone density and structure, right thigh: Secondary | ICD-10-CM | POA: Diagnosis not present

## 2019-12-27 DIAGNOSIS — E66813 Obesity, class 3: Secondary | ICD-10-CM

## 2019-12-27 MED ORDER — ESTRADIOL 1 MG PO TABS
1.0000 mg | ORAL_TABLET | Freq: Every day | ORAL | 4 refills | Status: DC
Start: 2019-12-27 — End: 2021-01-07

## 2019-12-27 MED ORDER — PROGESTERONE MICRONIZED 100 MG PO CAPS: 100.0000 mg | ORAL_CAPSULE | Freq: Every day | ORAL | 4 refills | Status: DC

## 2019-12-27 NOTE — Progress Notes (Signed)
Allison Frazier 1960/11/18 [id number]   History:    59 y.o. G3P3L3 Married.   Has 4 grand-children.    RP:  Established patient presenting for annual gyn exam   HPI: Postmenopause, well on HRT x 5 years. Hot flushes and night sweats mostly controled on current dosage. No PMB. No pelvic pain. No pain with IC. Urine/BMs wnl. Breasts wnl. BMI 44.45. Physically active. Health labs with Fam MD.   Past medical history,surgical history, family history and social history were all reviewed and documented in the EPIC chart.  Gynecologic History Patient's last menstrual period was 12/29/2012.  Obstetric History OB History  Gravida Para Term Preterm AB Living  '3 3 3     3  ' SAB TAB Ectopic Multiple Live Births          3    # Outcome Date GA Lbr Len/2nd Weight Sex Delivery Anes PTL Lv  3 Term     F CS-Unspec  N LIV  2 Term     M CS-Unspec  N LIV  1 Term     F Vag-Spont  N LIV     ROS: A ROS was performed and pertinent positives and negatives are included in the history.  GENERAL: No fevers or chills. HEENT: No change in vision, no earache, sore throat or sinus congestion. NECK: No pain or stiffness. CARDIOVASCULAR: No chest pain or pressure. No palpitations. PULMONARY: No shortness of breath, cough or wheeze. GASTROINTESTINAL: No abdominal pain, nausea, vomiting or diarrhea, melena or bright red blood per rectum. GENITOURINARY: No urinary frequency, urgency, hesitancy or dysuria. MUSCULOSKELETAL: No joint or muscle pain, no back pain, no recent trauma. DERMATOLOGIC: No rash, no itching, no lesions. ENDOCRINE: No polyuria, polydipsia, no heat or cold intolerance. No recent change in weight. HEMATOLOGICAL: No anemia or easy bruising or bleeding. NEUROLOGIC: No headache, seizures, numbness, tingling or weakness. PSYCHIATRIC: No depression, no loss of interest in normal activity or change in sleep pattern.     Exam:   BP 136/78   Ht 5' 4.5" (1.638 m)   Wt 263 lb (119.3 kg)   LMP  12/29/2012   BMI 44.45 kg/m   Body mass index is 44.45 kg/m.  General appearance : Well developed well nourished female. No acute distress HEENT: Eyes: no retinal hemorrhage or exudates,  Neck supple, trachea midline, no carotid bruits, no thyroidmegaly Lungs: Clear to auscultation, no rhonchi or wheezes, or rib retractions  Heart: Regular rate and rhythm, no murmurs or gallops Breast:Examined in sitting and supine position were symmetrical in appearance, no palpable masses or tenderness,  no skin retraction, no nipple inversion, no nipple discharge, no skin discoloration, no axillary or supraclavicular lymphadenopathy Abdomen: no palpable masses or tenderness, no rebound or guarding Extremities: no edema or skin discoloration or tenderness  Pelvic: Vulva: Normal             Vagina: No gross lesions or discharge  Cervix: No gross lesions or discharge  Uterus  AV, normal size, shape and consistency, non-tender and mobile  Adnexa  Without masses or tenderness  Anus: Normal   Assessment/Plan:  59 y.o. female for annual exam   1. Well female exam with routine gynecological exam Normal gynecologic exam.  Pap reflex negative in 2019, will repeat Pap test next year.  Breast exam normal.  Screening mammogram February 2021 was negative.  Colonoscopy in 2013.  Health labs here today. - CBC - Comp Met (CMET) - Lipid panel - TSH - VITAMIN  D 25 Hydroxy (Vit-D Deficiency, Fractures)  2. Post-menopause on HRT (hormone replacement therapy) Well on the hormone replacement therapy for 5 years.  No postmenopausal bleeding.  No contraindication to continue.  Estradiol 1 mg/tab, 1 tablet per mouth daily and Prometrium 100 mg/caps, 1 capsule at bedtime daily represcribed.  3. Osteopenia of neck of right femur Bone density August 2020 showed very mild osteopenia localized at the right femoral neck with a T score of -1.1.  Recommend repeating a bone density at 3 years.  Vitamin D supplements, calcium  intake of 1500 mg daily and regular weightbearing physical activity is recommended.  4. Class 3 severe obesity due to excess calories without serious comorbidity with body mass index (BMI) of 40.0 to 44.9 in adult [facility]) On a lower calorie/carb diet through a program with control of portions as well as intermittent fasting.  Recommend aerobic activities 5 times a week and light weightlifting every 2 days.  Other orders - psyllium (METAMUCIL SMOOTH TEXTURE) 28 % packet; Take 1 packet by mouth 2 (two) times daily. - progesterone (PROMETRIUM) 100 MG capsule; Take 1 capsule (100 mg total) by mouth at bedtime. - estradiol (ESTRACE) 1 MG tablet; Take 1 tablet (1 mg total) by mouth daily.  Princess Bruins MD, 8:24 AM 12/27/2019

## 2019-12-28 LAB — CBC
HCT: 46.2 % — ABNORMAL HIGH (ref 35.0–45.0)
Hemoglobin: 15 g/dL (ref 11.7–15.5)
MCH: 28.5 pg (ref 27.0–33.0)
MCHC: 32.5 g/dL (ref 32.0–36.0)
MCV: 87.7 fL (ref 80.0–100.0)
MPV: 11.8 fL (ref 7.5–12.5)
Platelets: 258 10*3/uL (ref 140–400)
RBC: 5.27 10*6/uL — ABNORMAL HIGH (ref 3.80–5.10)
RDW: 12.7 % (ref 11.0–15.0)
WBC: 4.7 10*3/uL (ref 3.8–10.8)

## 2019-12-28 LAB — LIPID PANEL
Cholesterol: 246 mg/dL — ABNORMAL HIGH (ref <200)
HDL: 53 mg/dL (ref >=50)
LDL Cholesterol (Calc): 154 mg/dL (calc) — ABNORMAL HIGH
Non-HDL Cholesterol (Calc): 193 mg/dL (calc) — ABNORMAL HIGH (ref <130)
Total CHOL/HDL Ratio: 4.6 (calc) (ref <5.0)
Triglycerides: 220 mg/dL — ABNORMAL HIGH (ref <150)

## 2019-12-28 LAB — COMPREHENSIVE METABOLIC PANEL
AG Ratio: 2.3 (calc) (ref 1.0–2.5)
ALT: 21 U/L (ref 6–29)
AST: 17 U/L (ref 10–35)
Albumin: 4.5 g/dL (ref 3.6–5.1)
Alkaline phosphatase (APISO): 69 U/L (ref 37–153)
BUN: 19 mg/dL (ref 7–25)
CO2: 22 mmol/L (ref 20–32)
Calcium: 10.3 mg/dL (ref 8.6–10.4)
Chloride: 105 mmol/L (ref 98–110)
Creat: 0.77 mg/dL (ref 0.50–1.05)
Globulin: 2 g/dL (calc) (ref 1.9–3.7)
Glucose, Bld: 100 mg/dL — ABNORMAL HIGH (ref 65–99)
Potassium: 4.4 mmol/L (ref 3.5–5.3)
Sodium: 139 mmol/L (ref 135–146)
Total Bilirubin: 0.4 mg/dL (ref 0.2–1.2)
Total Protein: 6.5 g/dL (ref 6.1–8.1)

## 2019-12-28 LAB — VITAMIN D 25 HYDROXY (VIT D DEFICIENCY, FRACTURES): Vit D, 25-Hydroxy: 14 ng/mL — ABNORMAL LOW (ref 30–100)

## 2019-12-28 LAB — TSH: TSH: 1.43 mIU/L (ref 0.40–4.50)

## 2020-01-04 DIAGNOSIS — E559 Vitamin D deficiency, unspecified: Secondary | ICD-10-CM

## 2020-01-12 ENCOUNTER — Other Ambulatory Visit: Payer: Self-pay

## 2020-01-12 ENCOUNTER — Ambulatory Visit
Admission: EM | Admit: 2020-01-12 | Discharge: 2020-01-12 | Disposition: A | Discharge status: Home / Self Care | Payer: Commercial Managed Care - PPO | Attending: Emergency Medicine | Admitting: Emergency Medicine

## 2020-01-12 DIAGNOSIS — R059 Cough, unspecified: Secondary | ICD-10-CM

## 2020-01-12 DIAGNOSIS — R0981 Nasal congestion: Secondary | ICD-10-CM

## 2020-01-12 DIAGNOSIS — Z1152 Encounter for screening for COVID-19: Secondary | ICD-10-CM

## 2020-01-12 DIAGNOSIS — J019 Acute sinusitis, unspecified: Secondary | ICD-10-CM

## 2020-01-12 MED ORDER — HYDROCODONE-HOMATROPINE 5-1.5 MG/5ML PO SYRP
5.0000 mL | ORAL_SOLUTION | Freq: Two times a day (BID) | ORAL | 0 refills | Status: DC | PRN
Start: 2020-01-12 — End: 2020-10-19

## 2020-01-12 MED ORDER — AMOXICILLIN-POT CLAVULANATE 875-125 MG PO TABS: 1.0000 | ORAL_TABLET | Freq: Two times a day (BID) | ORAL | 0 refills | Status: AC

## 2020-01-12 NOTE — ED Triage Notes (Signed)
Pt presents with nasal congestion and cough for past week

## 2020-01-12 NOTE — Discharge Instructions (Signed)
Based on symptoms and length of symptoms will cover for sinus infection Augmentin prescribed.  Take as directed and to completion COVID testing ordered.  It will take between 5-7 days for test results.  Someone will contact you regarding abnormal results.    In the meantime: You should remain isolated in your home for 10 days from symptom onset AND greater than 72 hours after symptoms resolution (absence of fever without the use of fever-reducing medication and improvement in respiratory symptoms), whichever is longer Get plenty of rest and push fluids Tessalon Perles prescribed for cough Use OTC zyrtec for nasal congestion, runny nose, and/or sore throat Use OTC flonase for nasal congestion and runny nose Use medications daily for symptom relief Use OTC medications like ibuprofen or tylenol as needed fever or pain Follow up with PCP Call or go to the ED if you have any new or worsening symptoms such as fever, worsening cough, shortness of breath, chest tightness, chest pain, turning blue, changes in mental status, etc..Marland Kitchen

## 2020-01-12 NOTE — ED Provider Notes (Signed)
[place]   [id number] 01/12/20 Arrival Time: 0923   CC: COVID symptoms  SUBJECTIVE: History from: patient.  Allison Frazier is a 59 y.o. female who presents with sinus pain, pressure, nasal congestion, and cough x 1 week.  Denies known sick exposure to COVID, flu or strep.  Denies recent travel.  Has tried OTC medications without relief.  Denies aggravating factors.  Denies previous covid infection in the past.   Denies fever, chills, SOB, wheezing, chest pain, nausea, changes in bowel or bladder habits.    ROS: As per HPI.  All other pertinent ROS negative.     Past Medical History:  Diagnosis Date  . Chondromalacia of both patellae   . High cholesterol    Past Surgical History:  Procedure Laterality Date  . CESAREAN SECTION  1996  . CESAREAN SECTION  1998  . ENDOMETRIAL ABLATION  04/04/07   HER OPTION  . LASIX     REFRACTIVE SURGERY-  RIGHT EYE  . TUBAL LIGATION  1998   Allergies  Allergen Reactions  . Simvastatin Other (See Comments)    BODY ACHES   No current facility-administered medications on file prior to encounter.   Current Outpatient Medications on File Prior to Encounter  Medication Sig Dispense Refill  . aspirin EC 81 MG tablet Take 81 mg by mouth daily.    Marland Kitchen doxylamine, Sleep, (UNISOM) 25 MG tablet Take 25 mg by mouth at bedtime.     Marland Kitchen estradiol (ESTRACE) 1 MG tablet Take 1 tablet (1 mg total) by mouth daily. 90 tablet 4  . Phenyleph-Doxylamine-DM-APAP (SINEX SEVERE+ VAPOCOOL PO) Place 1 application into the nose daily.     . progesterone (PROMETRIUM) 100 MG capsule Take one tablet daily at bedtime 90 capsule 4  . progesterone (PROMETRIUM) 100 MG capsule Take 1 capsule (100 mg total) by mouth at bedtime. 90 capsule 4  . psyllium (METAMUCIL SMOOTH TEXTURE) 28 % packet Take 1 packet by mouth 2 (two) times daily.     Social History   Socioeconomic History  . Marital status: Married    Spouse name: Not on file  . Number of children: Not on  file  . Years of education: Not on file  . Highest education level: Not on file  Occupational History  . Not on file  Tobacco Use  . Smoking status: Never Smoker  . Smokeless tobacco: Never Used  Vaping Use  . Vaping Use: Never used  Substance and Sexual Activity  . Alcohol use: Yes    Alcohol/week: 0.0 standard drinks    Comment: social  . Drug use: No  . Sexual activity: Yes    Partners: Male    Birth control/protection: Surgical    Comment: 1st intercourse- 16, partners- 15, married- 33 yrs   Other Topics Concern  . Not on file  Social History Narrative  . Not on file   Social Determinants of Health   Financial Resource Strain:   . Difficulty of Paying Living Expenses: Not on file  Food Insecurity:   . Worried About Charity fundraiser in the Last Year: Not on file  . Ran Out of Food in the Last Year: Not on file  Transportation Needs:   . Lack of Transportation (Medical): Not on file  . Lack of Transportation (Non-Medical): Not on file  Physical Activity:   . Days of Exercise per Week: Not on file  . Minutes of Exercise per Session: Not on file  Stress:   .  Feeling of Stress : Not on file  Social Connections:   . Frequency of Communication with Friends and Family: Not on file  . Frequency of Social Gatherings with Friends and Family: Not on file  . Attends Religious Services: Not on file  . Active Member of Clubs or Organizations: Not on file  . Attends Archivist Meetings: Not on file  . Marital Status: Not on file  Intimate Partner Violence:   . Fear of Current or Ex-Partner: Not on file  . Emotionally Abused: Not on file  . Physically Abused: Not on file  . Sexually Abused: Not on file   Family History  Problem Relation Age of Onset  . Heart disease Father   . Cancer Father        Prostate    OBJECTIVE:  Vitals:   01/12/20 0938  BP: (!) 146/86  Pulse: 80  Resp: 18  Temp: 99 F (37.2 C)  SpO2: 96%    General appearance: alert;  appears fatigued, but nontoxic; speaking in full sentences and tolerating own secretions HEENT: NCAT; Ears: EACs clear, TMs pearly gray; Eyes: PERRL.  EOM grossly intact. Sinuses: TTP; Nose: nares patent without rhinorrhea, Throat: oropharynx clear, tonsils non erythematous or enlarged, uvula midline  Neck: supple without LAD Lungs: unlabored respirations, symmetrical air entry; cough: mild; no respiratory distress; CTAB Heart: regular rate and rhythm.   Skin: warm and dry Psychological: alert and cooperative; normal mood and affect  ASSESSMENT & PLAN:  1. Encounter for screening for COVID-19   2. Nasal congestion   3. Cough   4. Acute non-recurrent sinusitis, unspecified location     Meds ordered this encounter  Medications  . amoxicillin-clavulanate (AUGMENTIN) 875-125 MG tablet    Sig: Take 1 tablet by mouth every 12 (twelve) hours for 10 days.    Dispense:  20 tablet    Refill:  0    Order Specific Question:   Supervising Provider    Answer:   Raylene Everts [[id number]]  . HYDROcodone-homatropine (HYCODAN) 5-1.5 MG/5ML syrup    Sig: Take 5 mLs by mouth every 12 (twelve) hours as needed for cough.    Dispense:  60 mL    Refill:  0    Order Specific Question:   Supervising Provider    Answer:   Raylene Everts [[id number]]   Based on symptoms and length of symptoms will cover for sinus infection Augmentin prescribed.  Take as directed and to completion COVID testing ordered.  It will take between 5-7 days for test results.  Someone will contact you regarding abnormal results.    In the meantime: You should remain isolated in your home for 10 days from symptom onset AND greater than 72 hours after symptoms resolution (absence of fever without the use of fever-reducing medication and improvement in respiratory symptoms), whichever is longer Get plenty of rest and push fluids Use OTC zyrtec for nasal congestion, runny nose, and/or sore throat Use OTC flonase for nasal  congestion and runny nose Use medications daily for symptom relief Use OTC medications like ibuprofen or tylenol as needed fever or pain Follow up with PCP Call or go to the ED if you have any new or worsening symptoms such as fever, worsening cough, shortness of breath, chest tightness, chest pain, turning blue, changes in mental status, etc...   Hycodan for cough  Reviewed expectations re: course of current medical issues. Questions answered. Outlined signs and symptoms indicating need for more acute intervention. Patient  verbalized understanding. After Visit Summary given.         Lestine Box, PA-C 01/12/20 [phone]

## 2020-01-13 LAB — COVID-19, FLU A+B AND RSV
Influenza A, NAA: NOT DETECTED
Influenza B, NAA: NOT DETECTED
RSV, NAA: NOT DETECTED
SARS-CoV-2, NAA: NOT DETECTED

## 2020-01-13 MED ORDER — VITAMIN D (ERGOCALCIFEROL) 1.25 MG (50000 UNIT) PO CAPS: 50000.0000 [IU] | ORAL_CAPSULE | ORAL | 0 refills | Status: DC

## 2020-02-03 HISTORY — PX: CHOLECYSTECTOMY: SHX55

## 2020-03-06 ENCOUNTER — Telehealth: Payer: Self-pay | Admitting: *Deleted

## 2020-03-06 NOTE — Telephone Encounter (Signed)
Patient called c/o yeast infection, had gallbladder surgery on 02/03/20 was on IV antibiotics, now has yeast infection itching only. Prefers to take diflucan tablet to help with yeast.  Please advise

## 2020-03-07 ENCOUNTER — Other Ambulatory Visit (INDEPENDENT_AMBULATORY_CARE_PROVIDER_SITE_OTHER): Payer: Commercial Managed Care - PPO

## 2020-03-07 ENCOUNTER — Other Ambulatory Visit: Payer: Self-pay

## 2020-03-07 DIAGNOSIS — E559 Vitamin D deficiency, unspecified: Secondary | ICD-10-CM

## 2020-03-07 LAB — VITAMIN D 25 HYDROXY (VIT D DEFICIENCY, FRACTURES): Vit D, 25-Hydroxy: 41 ng/mL (ref 30–100)

## 2020-03-08 ENCOUNTER — Other Ambulatory Visit: Payer: Commercial Managed Care - PPO

## 2020-03-08 MED ORDER — FLUCONAZOLE 150 MG PO TABS: 150.0000 mg | ORAL_TABLET | Freq: Every day | ORAL | 1 refills | Status: DC

## 2020-03-08 NOTE — Telephone Encounter (Signed)
Patient informed, Rx sent.  

## 2020-03-08 NOTE — Telephone Encounter (Signed)
Agree with Fluconazole 150 mg #3, refill x 1.

## 2020-04-11 ENCOUNTER — Encounter: Payer: Self-pay | Admitting: Obstetrics & Gynecology

## 2020-10-10 ENCOUNTER — Ambulatory Visit: Payer: Commercial Managed Care - PPO | Admitting: Allergy & Immunology

## 2020-10-19 ENCOUNTER — Other Ambulatory Visit: Payer: Self-pay

## 2020-10-19 ENCOUNTER — Encounter: Payer: Self-pay | Admitting: Allergy & Immunology

## 2020-10-19 ENCOUNTER — Ambulatory Visit: Payer: Commercial Managed Care - PPO | Admitting: Allergy & Immunology

## 2020-10-19 VITALS — BP 122/78 | HR 77 | Temp 98.0°F | Resp 18 | Ht 65.0 in | Wt 266.6 lb

## 2020-10-19 DIAGNOSIS — J4520 Mild intermittent asthma, uncomplicated: Secondary | ICD-10-CM | POA: Diagnosis not present

## 2020-10-19 DIAGNOSIS — J302 Other seasonal allergic rhinitis: Secondary | ICD-10-CM

## 2020-10-19 DIAGNOSIS — J3089 Other allergic rhinitis: Secondary | ICD-10-CM

## 2020-10-19 MED ORDER — MONTELUKAST SODIUM 10 MG PO TABS: 10.0000 mg | ORAL_TABLET | Freq: Every day | ORAL | 3 refills | Status: DC

## 2020-10-19 MED ORDER — CARBINOXAMINE MALEATE 6 MG PO TABS: 6.0000 mg | ORAL_TABLET | Freq: Three times a day (TID) | ORAL | 3 refills | Status: DC

## 2020-10-19 MED ORDER — XHANCE 93 MCG/ACT NA EXHU: 2.0000 | INHALANT_SUSPENSION | Freq: Two times a day (BID) | NASAL | 3 refills | Status: DC

## 2020-10-19 NOTE — Progress Notes (Addendum)
NEW PATIENT  Date of Service/Encounter:  10/19/20  Consult requested by: Sharilyn Sites, MD   Assessment:   Seasonal and perennial allergic rhinitis (grasses, ragweed, trees, outdoor molds, and cockroach)  Mild intermittent asthma, uncomplicated  Plan/Recommendations:   1. Seasonal and perennial allergic rhinitis - Testing today showed: grasses, ragweed, trees, outdoor molds, and cockroach - Copy of test results provided.  - Avoidance measures provided. - Stop taking: all of your current medications - Start taking: Ryvent (carbinoxamine) '6mg'$  tablet 3-4 times daily as needed, Singulair (montelukast) '10mg'$  daily, and Xhance (fluticasone) 1-2 sprays per nostril twice daily4 - Singulair can cause irritability/depression, so beware of any mood changes!  - You can use an extra dose of the antihistamine, if needed, for breakthrough symptoms.  - Consider nasal saline rinses 1-2 times daily to remove allergens from the nasal cavities as well as help with mucous clearance (this is especially helpful to do before the nasal sprays are given) - Consider allergy shots as a means of long-term control. - Allergy shots "re-train" and "reset" the immune system to ignore environmental allergens and decrease the resulting immune response to those allergens (sneezing, itchy watery eyes, runny nose, nasal congestion, etc).    - Allergy shots improve symptoms in 75-85% of patients.  - We can discuss more at the next appointment if the medications are not working for you.  2. Mild intermittent asthma, uncomplicated - I am not sure if you have asthma. - in any case it does not seem that you need a controller medication at this time.   3. Return in about 2 months (around 12/19/2020).    This note in its entirety was forwarded to the Provider who requested this consultation.  Subjective:   Allison Frazier is a 60 y.o. female presenting today for evaluation of  Chief Complaint  Patient presents with    Allergy Testing    Allison Frazier has a history of the following: Patient Active Problem List   Diagnosis Date Noted   Acute focal neurological deficit 12/12/2017   Left facial numbness 12/12/2017   Acute focal neurologic deficit with partial resolution 12/12/2017   Near syncope 02/17/2013   Morbid obesity ([place]) 07/11/2012   Overweight(278.02) 06/23/2012   Fibroid uterus 05/05/2011   High cholesterol     History obtained from: chart review and patient.  Allison Frazier was referred by Sharilyn Sites, MD.     Allison Frazier is a 60 y.o. female presenting for an evaluation of environmental allergies .   Asthma/Respiratory Symptom History: She has albuterol use as needed. She was never diagnosed with asthma. She might have had it as a child. She was never on a controller medication. She did reports that her albuterol usually expire. She has noticed some more nocturnal wheezing since contracting COVID19. She was vaccinated. She did not get the antiviral.   Allergic Rhinitis Symptom History: She reports that she struggles with allergies and sinuses. She gets antibiotics and a steroid shot and prednisone when she goes in.  She has never been allergy tested. She knows that she has been allergic to is horses. She starts wheezes and can feel it coming on. This is especially notable when she is around a horse trailer. She cannot go to the fair or circus with any horses.   Skin Symptom History: She did have some rashes before she had gallbladder surgery. Removal of the gallbladder in December 2021 improved. She had this lesion for years. It never went away  with topical creams. In any case, it is gone.   Otherwise, there is no history of other atopic diseases, including drug allergies, stinging insect allergies, eczema, urticaria, or contact dermatitis. There is no significant infectious history. Vaccinations are up to date.    Past Medical History: Patient Active Problem List   Diagnosis Date Noted    Acute focal neurological deficit 12/12/2017   Left facial numbness 12/12/2017   Acute focal neurologic deficit with partial resolution 12/12/2017   Near syncope 02/17/2013   Morbid obesity ([place]) 07/11/2012   Overweight(278.02) 06/23/2012   Fibroid uterus 05/05/2011   High cholesterol     Medication List:  Allergies as of 10/19/2020       Reactions   Simvastatin Other (See Comments)   BODY ACHES        Medication List        Accurate as of October 19, 2020  1:06 PM. If you have any questions, ask your nurse or doctor.          albuterol 108 (90 Base) MCG/ACT inhaler Commonly known as: VENTOLIN HFA SMARTSIG:1-2 Puff(s) By Mouth Every 4 Hours PRN   aspirin EC 81 MG tablet Take 81 mg by mouth daily.   Carbinoxamine Maleate 6 MG Tabs Take 6 mg by mouth in the morning, at noon, and at bedtime. Started by: Valentina Shaggy, MD   doxylamine (Sleep) 25 MG tablet Commonly known as: UNISOM Take 25 mg by mouth at bedtime.   estradiol 1 MG tablet Commonly known as: ESTRACE Take 1 tablet (1 mg total) by mouth daily.   fluconazole 150 MG tablet Commonly known as: DIFLUCAN Take 1 tablet (150 mg total) by mouth daily.   HYDROcodone-homatropine 5-1.5 MG/5ML syrup Commonly known as: Hycodan Take 5 mLs by mouth every 12 (twelve) hours as needed for cough.   levocetirizine 5 MG tablet Commonly known as: XYZAL Take 5 mg by mouth every evening.   montelukast 10 MG tablet Commonly known as: SINGULAIR Take 1 tablet (10 mg total) by mouth at bedtime. Started by: Valentina Shaggy, MD   progesterone 100 MG capsule Commonly known as: Prometrium Take one tablet daily at bedtime   progesterone 100 MG capsule Commonly known as: PROMETRIUM Take 1 capsule (100 mg total) by mouth at bedtime.   psyllium 28 % packet Commonly known as: METAMUCIL SMOOTH TEXTURE Take 1 packet by mouth 2 (two) times daily.   SINEX SEVERE+ VAPOCOOL PO Place 1 application into the nose  daily.   VITAMIN B 12 PO Take by mouth.   Vitamin D (Ergocalciferol) 1.25 MG (50000 UNIT) Caps capsule Commonly known as: DRISDOL Take 1 capsule (50,000 Units total) by mouth every 7 (seven) days.   Xhance 93 MCG/ACT Exhu Generic drug: Fluticasone Propionate Place 2 applicators into the nose 2 (two) times daily. Started by: Valentina Shaggy, MD        Birth History: non-contributory  Developmental History: non-contributory  Past Surgical History: Past Surgical History:  Procedure Laterality Date   [place]   ENDOMETRIAL ABLATION  04/04/07   HER OPTION   LASIX     REFRACTIVE SURGERY-  RIGHT EYE   TUBAL LIGATION  1998     Family History: Family History  Problem Relation Age of Onset   Heart disease Father    Cancer Father        Prostate     Social History: Aseneth lives at home with her family.  She lives in a house that was built in [address].  Throughout the home.  They have electric heating and central cooling.  There is 1 dog inside of the home.  There are no dust mite covers on the bed, but there are on the pillows.  There is no tobacco exposure.  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  There is no exposure to fumes, chemicals, or dust.  She does not use a HEPA filter in her home.  She reports that most of the home she goes into are fairly clean.   Review of Systems  Constitutional: Negative.  Negative for chills, fever, malaise/fatigue and weight loss.  HENT:  Positive for congestion. Negative for ear discharge, ear pain and sinus pain.   Eyes:  Negative for pain, discharge and redness.  Respiratory:  Negative for cough, sputum production, shortness of breath and wheezing.   Cardiovascular: Negative.  Negative for chest pain and palpitations.  Gastrointestinal:  Negative for abdominal pain, constipation, diarrhea, heartburn, nausea and vomiting.  Skin:  Negative.  Negative for itching and rash.  Neurological:  Negative for dizziness and headaches.  Endo/Heme/Allergies:  Positive for environmental allergies. Does not bruise/bleed easily.      Objective:   Blood pressure 122/78, pulse 77, temperature 98 F (36.7 C), temperature source Temporal, resp. rate 18, height '5\' 5"'$  (1.651 m), weight 266 lb 9.6 oz (120.9 kg), last menstrual period 12/29/2012, SpO2 96 %. Body mass index is 44.36 kg/m.   Physical Exam:   Physical Exam Vitals reviewed.  Constitutional:      Appearance: She is well-developed.  HENT:     Head: Normocephalic and atraumatic.     Right Ear: Tympanic membrane, ear canal and external ear normal. No drainage, swelling or tenderness. Tympanic membrane is not injected, scarred, erythematous, retracted or bulging.     Left Ear: Tympanic membrane, ear canal and external ear normal. No drainage, swelling or tenderness. Tympanic membrane is not injected, scarred, erythematous, retracted or bulging.     Nose: No nasal deformity, septal deviation, mucosal edema or rhinorrhea.     Right Turbinates: Enlarged, swollen and pale.     Left Turbinates: Enlarged, swollen and pale.     Right Sinus: No maxillary sinus tenderness or frontal sinus tenderness.     Left Sinus: No maxillary sinus tenderness or frontal sinus tenderness.     Comments: Polypoid sinus degeneration.    Mouth/Throat:     Mouth: Mucous membranes are not pale and not dry.     Pharynx: Uvula midline.  Eyes:     General:        Right eye: No discharge.        Left eye: No discharge.     Conjunctiva/sclera: Conjunctivae normal.     Right eye: Right conjunctiva is not injected. No chemosis.    Left eye: Left conjunctiva is not injected. No chemosis.    Pupils: Pupils are equal, round, and reactive to light.  Cardiovascular:     Rate and Rhythm: Normal rate and regular rhythm.     Heart sounds: Normal heart sounds.  Pulmonary:     Effort: Pulmonary effort is  normal. No tachypnea, accessory muscle usage or respiratory distress.     Breath sounds: Normal breath sounds. No wheezing, rhonchi or rales.     Comments: Moving air well in all lung fields. No increased work of breathing noted.  Chest:     Chest  wall: No tenderness.  Abdominal:     Tenderness: There is no abdominal tenderness. There is no guarding or rebound.  Lymphadenopathy:     Head:     Right side of head: No submandibular, tonsillar or occipital adenopathy.     Left side of head: No submandibular, tonsillar or occipital adenopathy.     Cervical: No cervical adenopathy.  Skin:    General: Skin is warm.     Capillary Refill: Capillary refill takes less than 2 seconds.     Coloration: Skin is not pale.     Findings: No abrasion, erythema, petechiae or rash. Rash is not papular, urticarial or vesicular.  Neurological:     Mental Status: She is alert.  Psychiatric:        Behavior: Behavior is cooperative.     Diagnostic studies:   Allergy Studies:    Airborne Adult Perc - 10/19/20 0903     Time Antigen Placed [id number]    Allergen Manufacturer Lavella Hammock    Location Back    Number of Test 59    1. Control-Buffer 50% Glycerol Negative    2. Control-Histamine 1 mg/ml 2+    3. Albumin saline Negative    4. [place] 3+    5. [place] Negative    6. [place] 2+    7. [place] Blue 2+    8. Meadow Fescue 3+    9. Perennial Rye 3+    10. Sweet Vernal 3+    11. Timothy Negative    12. Cocklebur Negative    13. Burweed Marshelder Negative    14. Ragweed, short Negative    15. Ragweed, Giant Negative    16. Plantain,  English Negative    17. Lamb's Quarters Negative    18. Sheep Sorrell Negative    19. Rough Pigweed Negative    20. Marsh Elder, Rough Negative    21. Mugwort, Common Negative    22. Ash mix Negative    23. Birch mix Negative    24. Beech American Negative    25. Box, Elder Negative    26. Cedar, red Negative    27. Cottonwood, [place] Negative    28. Elm mix  Negative    29. Hickory Negative    30. Maple mix Negative    31. Oak, [place] mix Negative    32. Pecan Pollen 2+    33. Pine mix Negative    34. Sycamore Eastern Negative    35. Stevenson, Black Pollen Negative    36. Alternaria alternata Negative    37. Cladosporium Herbarum Negative    38. Aspergillus mix Negative    39. Penicillium mix Negative    40. Bipolaris sorokiniana (Helminthosporium) Negative    41. Drechslera spicifera (Curvularia) Negative    42. Mucor plumbeus Negative    43. Fusarium moniliforme Negative    44. Aureobasidium pullulans (pullulara) Negative    45. Rhizopus oryzae Negative    46. Botrytis cinera Negative    47. Epicoccum nigrum Negative    48. Phoma betae Negative    49. Candida Albicans Negative    50. Trichophyton mentagrophytes Negative    51. Mite, D Farinae  5,000 AU/ml Negative    52. Mite, D Pteronyssinus  5,000 AU/ml Negative    53. Cat Hair 10,000 BAU/ml Negative    54.  Dog Epithelia Negative    55. Mixed Feathers Negative    56. Horse Epithelia 3+    57. Cockroach, German 2+    58.  Mouse Negative    59. Tobacco Leaf Negative             Intradermal - 10/19/20 0933     Time Antigen Placed 0915    Allergen Manufacturer Lavella Hammock    Location Arm    Number of Test 11    Intradermal Select    Control Negative    [place] Negative    Ragweed mix 3+    Weed mix Negative    Tree mix 3+    Mold 1 3+    Mold 2 Negative    Mold 3 Negative    Mold 4 Negative    Cat Negative    Dog Negative    Mite mix Negative             Allergy testing results were read and interpreted by myself, documented by clinical staff.         Salvatore Marvel, MD [facility]

## 2020-10-19 NOTE — Patient Instructions (Addendum)
1. Seasonal and perennial allergic rhinitis - Testing today showed: grasses, ragweed, trees, outdoor molds, and cockroach - Copy of test results provided.  - Avoidance measures provided. - Stop taking: all of your current medications - Start taking: Ryvent (carbinoxamine) '6mg'$  tablet 3-4 times daily as needed, Singulair (montelukast) '10mg'$  daily, and Xhance (fluticasone) 1-2 sprays per nostril twice daily4 - Singulair can cause irritability/depression, so beware of any mood changes!  - You can use an extra dose of the antihistamine, if needed, for breakthrough symptoms.  - Consider nasal saline rinses 1-2 times daily to remove allergens from the nasal cavities as well as help with mucous clearance (this is especially helpful to do before the nasal sprays are given) - Consider allergy shots as a means of long-term control. - Allergy shots "re-train" and "reset" the immune system to ignore environmental allergens and decrease the resulting immune response to those allergens (sneezing, itchy watery eyes, runny nose, nasal congestion, etc).    - Allergy shots improve symptoms in 75-85% of patients.  - We can discuss more at the next appointment if the medications are not working for you.  2. Mild intermittent asthma, uncomplicated - I am not sure if you have asthma. - in any case it does not seem that you need a controller medication at this time.   3. Return in about 2 months (around 12/19/2020).    Please inform us of any Emergency Department visits, hospitalizations, or changes in symptoms. Call us before going to the ED for breathing or allergy symptoms since we might be able to fit you in for a sick visit. Feel free to contact us anytime with any questions, problems, or concerns.  It was a pleasure to meet you today!  Websites that have reliable patient information: 1. American Academy of Asthma, Allergy, and Immunology: www.aaaai.org 2. Food Allergy Research and Education (FARE):  foodallergy.org 3. Mothers of Asthmatics: http://www.asthmacommunitynetwork.org 4. American College of Allergy, Asthma, and Immunology: www.acaai.org   COVID-19 Vaccine Information can be found at: ShippingScam.[email or web address].uk For questions related to vaccine distribution or appointments, please email [email or web address] or call [phone].   We realize that you might be concerned about having an allergic reaction to the COVID19 vaccines. To help with that concern, WE ARE OFFERING THE COVID19 VACCINES IN OUR OFFICE! Ask the front desk for dates!     "Like" [place] on Facebook and Instagram for our latest updates!      A healthy democracy works best when [employer] participate! Make sure you are registered to vote! If you have moved or changed any of your contact information, you will need to get this updated before voting!  In some cases, you MAY be able to register to vote online: [email or web address]      Reducing Pollen Exposure  The American Academy of Allergy, Asthma and Immunology suggests the following steps to reduce your exposure to pollen during allergy seasons.    Do not hang sheets or clothing out to dry; pollen may collect on these items. Do not mow lawns or spend time around freshly cut grass; mowing stirs up pollen. Keep windows closed at night.  Keep car windows closed while driving. Minimize morning activities outdoors, a time when pollen counts are usually at their highest. Stay indoors as much as possible when pollen counts or humidity is high and on windy days when pollen tends to remain in the air longer. Use air conditioning when possible.  Many air conditioners have filters that trap the pollen spores. Use  a HEPA room air filter to remove pollen form the indoor air you breathe.  Control of Mold Allergen   Mold and fungi can grow on a variety of surfaces provided certain temperature  and moisture conditions exist.  Outdoor molds grow on plants, decaying vegetation and soil.  The major outdoor mold, Alternaria and Cladosporium, are found in very high numbers during hot and dry conditions.  Generally, a late Summer - Fall peak is seen for common outdoor fungal spores.  Rain will temporarily lower outdoor mold spore count, but counts rise rapidly when the rainy period ends.  The most important indoor molds are Aspergillus and Penicillium.  Dark, humid and poorly ventilated basements are ideal sites for mold growth.  The next most common sites of mold growth are the bathroom and the kitchen.  Outdoor (Seasonal) Mold Control  Positive outdoor molds via skin testing: Alternaria and Cladosporium  Use air conditioning and keep windows closed Avoid exposure to decaying vegetation. Avoid leaf raking. Avoid grain handling. Consider wearing a face mask if working in moldy areas.    Control of Cockroach Allergen  Cockroach allergen has been identified as an important cause of acute attacks of asthma, especially in urban settings.  There are fifty-five species of cockroach that exist in the [place], however only three, the [place], [place] species produce allergen that can affect patients with Asthma.  Allergens can be obtained from fecal particles, egg casings and secretions from cockroaches.    Remove food sources. Reduce access to water. Seal access and entry points. Spray runways with 0.5-1% Diazinon or Chlorpyrifos Blow boric acid power under stoves and refrigerator. Place bait stations (hydramethylnon) at feeding sites.  Allergy Shots   Allergies are the result of a chain reaction that starts in the immune system. Your immune system controls how your body defends itself. For instance, if you have an allergy to pollen, your immune system identifies pollen as an invader or allergen. Your immune system overreacts by producing antibodies called Immunoglobulin E  (IgE). These antibodies travel to cells that release chemicals, causing an allergic reaction.  The concept behind allergy immunotherapy, whether it is received in the form of shots or tablets, is that the immune system can be desensitized to specific allergens that trigger allergy symptoms. Although it requires time and patience, the payback can be long-term relief.  How Do Allergy Shots Work?  Allergy shots work much like a vaccine. Your body responds to injected amounts of a particular allergen given in increasing doses, eventually developing a resistance and tolerance to it. Allergy shots can lead to decreased, minimal or no allergy symptoms.  There generally are two phases: build-up and maintenance. Build-up often ranges from three to six months and involves receiving injections with increasing amounts of the allergens. The shots are typically given once or twice a week, though more rapid build-up schedules are sometimes used.  The maintenance phase begins when the most effective dose is reached. This dose is different for each person, depending on how allergic you are and your response to the build-up injections. Once the maintenance dose is reached, there are longer periods between injections, typically two to four weeks.  Occasionally doctors give cortisone-type shots that can temporarily reduce allergy symptoms. These types of shots are different and should not be confused with allergy immunotherapy shots.  Who Can Be Treated with Allergy Shots?  Allergy shots may be a good treatment approach for people with allergic rhinitis (hay fever), allergic asthma, conjunctivitis (  eye allergy) or stinging insect allergy.   Before deciding to begin allergy shots, you should consider:   The length of allergy season and the severity of your symptoms  Whether medications and/or changes to your environment can control your symptoms  Your desire to avoid long-term medication use  Time: allergy  immunotherapy requires a major time commitment  Cost: may vary depending on your insurance coverage  Allergy shots for children age 70 and older are effective and often well tolerated. They might prevent the onset of new allergen sensitivities or the progression to asthma.  Allergy shots are not started on patients who are pregnant but can be continued on patients who become pregnant while receiving them. In some patients with other medical conditions or who take certain common medications, allergy shots may be of risk. It is important to mention other medications you talk to your allergist.   When Will I Feel Better?  Some may experience decreased allergy symptoms during the build-up phase. For others, it may take as long as 12 months on the maintenance dose. If there is no improvement after a year of maintenance, your allergist will discuss other treatment options with you.  If you aren't responding to allergy shots, it may be because there is not enough dose of the allergen in your vaccine or there are missing allergens that were not identified during your allergy testing. Other reasons could be that there are high levels of the allergen in your environment or major exposure to non-allergic triggers like tobacco smoke.  What Is the Length of Treatment?  Once the maintenance dose is reached, allergy shots are generally continued for three to five years. The decision to stop should be discussed with your allergist at that time. Some people may experience a permanent reduction of allergy symptoms. Others may relapse and a longer course of allergy shots can be considered.  What Are the Possible Reactions?  The two types of adverse reactions that can occur with allergy shots are local and systemic. Common local reactions include very mild redness and swelling at the injection site, which can happen immediately or several hours after. A systemic reaction, which is less common, affects the entire  body or a particular body system. They are usually mild and typically respond quickly to medications. Signs include increased allergy symptoms such as sneezing, a stuffy nose or hives.  Rarely, a serious systemic reaction called anaphylaxis can develop. Symptoms include swelling in the throat, wheezing, a feeling of tightness in the chest, nausea or dizziness. Most serious systemic reactions develop within 30 minutes of allergy shots. This is why it is strongly recommended you wait in your doctor's office for 30 minutes after your injections. Your allergist is trained to watch for reactions, and his or her staff is trained and equipped with the proper medications to identify and treat them.  Who Should Administer Allergy Shots?  The preferred location for receiving shots is your prescribing allergist's office. Injections can sometimes be given at another facility where the physician and staff are trained to recognize and treat reactions, and have received instructions by your prescribing allergist.

## 2020-10-26 ENCOUNTER — Telehealth: Payer: Self-pay

## 2020-10-26 NOTE — Telephone Encounter (Signed)
Pa submitted thru cover my meds for xhance waiting on results from insurance

## 2020-12-12 ENCOUNTER — Ambulatory Visit: Payer: Commercial Managed Care - PPO | Admitting: Allergy & Immunology

## 2020-12-17 ENCOUNTER — Other Ambulatory Visit: Payer: Self-pay

## 2020-12-17 MED ORDER — XHANCE 93 MCG/ACT NA EXHU: 2.0000 | INHALANT_SUSPENSION | Freq: Two times a day (BID) | NASAL | 3 refills | Status: DC

## 2020-12-17 NOTE — Telephone Encounter (Signed)
Refill for Xhance x 1 with 3 refills sent to [facility] pharmacy.

## 2020-12-18 MED ORDER — XHANCE 93 MCG/ACT NA EXHU: 2.0000 | INHALANT_SUSPENSION | Freq: Two times a day (BID) | NASAL | 3 refills | Status: DC

## 2020-12-18 NOTE — Addendum Note (Signed)
Addended by: Katherina Right D on: 12/18/2020 11:51 AM   Modules accepted: Orders

## 2020-12-25 NOTE — Telephone Encounter (Signed)
Pa was denied as it is a plan exclusion please advise to change in therapy

## 2020-12-26 ENCOUNTER — Other Ambulatory Visit: Payer: Self-pay

## 2020-12-26 ENCOUNTER — Encounter: Payer: Self-pay | Admitting: Allergy & Immunology

## 2020-12-26 ENCOUNTER — Ambulatory Visit
Admission: EM | Admit: 2020-12-26 | Discharge: 2020-12-26 | Disposition: A | Discharge status: Home / Self Care | Payer: Commercial Managed Care - PPO | Attending: Family Medicine | Admitting: Family Medicine

## 2020-12-26 DIAGNOSIS — R52 Pain, unspecified: Secondary | ICD-10-CM

## 2020-12-26 DIAGNOSIS — Z20828 Contact with and (suspected) exposure to other viral communicable diseases: Secondary | ICD-10-CM

## 2020-12-26 MED ORDER — OSELTAMIVIR PHOSPHATE 75 MG PO CAPS: 75.0000 mg | ORAL_CAPSULE | Freq: Two times a day (BID) | ORAL | 0 refills | Status: AC

## 2020-12-26 MED ORDER — PROMETHAZINE-DM 6.25-15 MG/5ML PO SYRP: 5.0000 mL | ORAL_SOLUTION | Freq: Four times a day (QID) | ORAL | 0 refills | Status: DC | PRN

## 2020-12-26 NOTE — ED Triage Notes (Signed)
Pt reports body aches and chill since this morning. States she felt bigheaded this morning when she woke up. Reports her coworker has the flu and she was in contact 2 days ago.

## 2020-12-26 NOTE — Telephone Encounter (Signed)
Resubmitted the pa with j33.1 dx code waiting on response from insurance

## 2020-12-26 NOTE — ED Provider Notes (Signed)
[facility]   [id number] 12/26/20 Arrival Time: 7124  ASSESSMENT & PLAN:  1. Exposure to influenza   2. Body aches    Discussed typical duration of viral illnesses. COVID-19/influenza testing sent. OTC symptom care as needed.  Meds ordered this encounter  Medications   oseltamivir (TAMIFLU) 75 MG capsule    Sig: Take 1 capsule (75 mg total) by mouth 2 (two) times daily for 5 days.    Dispense:  10 capsule    Refill:  0   promethazine-dextromethorphan (PROMETHAZINE-DM) 6.25-15 MG/5ML syrup    Sig: Take 5 mLs by mouth 4 (four) times daily as needed for cough.    Dispense:  118 mL    Refill:  0     Follow-up Information     Sharilyn Sites, MD.   Specialty: Family Medicine Why: As needed. Contact information: [address] [place] [address] [phone]                 Reviewed expectations re: course of current medical issues. Questions answered. Outlined signs and symptoms indicating need for more acute intervention. Understanding verbalized. After Visit Summary given.   SUBJECTIVE: History from: patient. Allison Frazier is a 60 y.o. female who reports: exp to influenza; today with body aches. Denies: fever, cough, and difficulty breathing. Normal PO intake without n/v/d.   OBJECTIVE:  Vitals:   12/26/20 1313  BP: (!) 142/89  Pulse: 76  Resp: 18  Temp: 98.3 F (36.8 C)  TempSrc: Oral  SpO2: 96%    General appearance: alert; no distress Eyes: PERRLA; EOMI; conjunctiva normal HENT: [place]; AT; without nasal congestion Neck: supple  Lungs: speaks full sentences without difficulty; unlabored Extremities: no edema Skin: warm and dry Neurologic: normal gait Psychological: alert and cooperative; normal mood and affect  Labs:  Labs Reviewed  COVID-19, FLU A+B NAA   Allergies  Allergen Reactions   Simvastatin Other (See Comments)    BODY ACHES    Past Medical History:  Diagnosis Date   Chondromalacia of both patellae     High cholesterol    Social History   Socioeconomic History   Marital status: Married    Spouse name: Not on file   Number of children: Not on file   Years of education: Not on file   Highest education level: Not on file  Occupational History   Not on file  Tobacco Use   Smoking status: Never   Smokeless tobacco: Never  Vaping Use   Vaping Use: Never used  Substance and Sexual Activity   Alcohol use: Yes    Alcohol/week: 0.0 standard drinks    Comment: social   Drug use: Never   Sexual activity: Yes    Partners: Male    Birth control/protection: Surgical    Comment: 1st intercourse- 16, partners- 51, married- 86 yrs   Other Topics Concern   Not on file  Social History Narrative   Not on file   Social Determinants of Health   Financial Resource Strain: Not on file  Food Insecurity: Not on file  Transportation Needs: Not on file  Physical Activity: Not on file  Stress: Not on file  Social Connections: Not on file  Intimate Partner Violence: Not on file   Family History  Problem Relation Age of Onset   Heart disease Father    Cancer Father        Prostate   Past Surgical History:  Procedure Laterality Date   [place]  [place]   ENDOMETRIAL ABLATION  04/04/07   HER OPTION   LASIX     REFRACTIVE SURGERY-  RIGHT EYE   TUBAL LIGATION  1998     Vanessa Kick, MD 12/26/20 1345

## 2020-12-26 NOTE — Telephone Encounter (Signed)
Can we try J33.1?   Salvatore Marvel, MD [facility]

## 2020-12-27 ENCOUNTER — Ambulatory Visit: Payer: Commercial Managed Care - PPO | Admitting: Obstetrics & Gynecology

## 2020-12-27 LAB — COVID-19, FLU A+B NAA
Influenza A, NAA: NOT DETECTED
Influenza B, NAA: NOT DETECTED
SARS-CoV-2, NAA: NOT DETECTED

## 2021-01-07 ENCOUNTER — Ambulatory Visit (INDEPENDENT_AMBULATORY_CARE_PROVIDER_SITE_OTHER): Payer: Commercial Managed Care - PPO | Admitting: Obstetrics & Gynecology

## 2021-01-07 ENCOUNTER — Other Ambulatory Visit: Payer: Self-pay

## 2021-01-07 ENCOUNTER — Other Ambulatory Visit (HOSPITAL_COMMUNITY)
Admission: RE | Admit: 2021-01-07 | Discharge: 2021-01-07 | Disposition: A | Discharge status: Home / Self Care | Payer: Commercial Managed Care - PPO | Source: Ambulatory Visit | Attending: Obstetrics & Gynecology | Admitting: Obstetrics & Gynecology

## 2021-01-07 ENCOUNTER — Encounter: Payer: Self-pay | Admitting: Obstetrics & Gynecology

## 2021-01-07 VITALS — BP 128/80 | HR 79 | Ht 64.5 in | Wt 270.0 lb

## 2021-01-07 DIAGNOSIS — Z6841 Body Mass Index (BMI) 40.0 and over, adult: Secondary | ICD-10-CM

## 2021-01-07 DIAGNOSIS — N841 Polyp of cervix uteri: Secondary | ICD-10-CM | POA: Diagnosis present

## 2021-01-07 DIAGNOSIS — Z01419 Encounter for gynecological examination (general) (routine) without abnormal findings: Secondary | ICD-10-CM

## 2021-01-07 DIAGNOSIS — Z79890 Hormone replacement therapy (postmenopausal): Secondary | ICD-10-CM | POA: Diagnosis not present

## 2021-01-07 DIAGNOSIS — M85851 Other specified disorders of bone density and structure, right thigh: Secondary | ICD-10-CM

## 2021-01-07 MED ORDER — ESTRADIOL 1 MG PO TABS
1.0000 mg | ORAL_TABLET | Freq: Every day | ORAL | 4 refills | Status: DC
Start: 2021-01-07 — End: 2021-10-16

## 2021-01-07 MED ORDER — PROGESTERONE MICRONIZED 100 MG PO CAPS
100.0000 mg | ORAL_CAPSULE | Freq: Every day | ORAL | 4 refills | Status: DC
Start: 2021-01-07 — End: 2021-10-16

## 2021-01-07 NOTE — Progress Notes (Signed)
Allison Frazier February 02, 1961 [id number]   History:    60 y.o. G3P3L3 Married.   Has 4 grand-children.     RP:  Established patient presenting for annual gyn exam    HPI: Postmenopause, well on HRT x 6 years.  Hot flushes and night sweats mostly controled on current dosage.  No PMB.  No pelvic pain.  No pain with IC.  Pap 08/2017 Neg.  Pap reflex done today.  Urine/BMs wnl.  Breasts wnl. Screening mammo 04/11/2020 Neg.  BMI 45.63. Physically active.  Health labs with Fam MD. DEXA 09/28/18 very mild Osteopenia only at the Rt Femoral Neck with T-Score at -1.1, all other sites normal.  Recommend repeating at 5 yrs. [place] 08/22/11.  Health labs with Fam MD.   Past medical history,surgical history, family history and social history were all reviewed and documented in the EPIC chart.  Gynecologic History Patient's last menstrual period was 12/29/2012.  Obstetric History OB History  Gravida Para Term Preterm AB Living  3 3 3     3   SAB IAB Ectopic Multiple Live Births          3    # Outcome Date GA Lbr Len/2nd Weight Sex Delivery Anes PTL Lv  3 Term     F CS-Unspec  N LIV  2 Term     M CS-Unspec  N LIV  1 Term     F Vag-Spont  N LIV     ROS: A ROS was performed and pertinent positives and negatives are included in the history.  GENERAL: No fevers or chills. HEENT: No change in vision, no earache, sore throat or sinus congestion. NECK: No pain or stiffness. CARDIOVASCULAR: No chest pain or pressure. No palpitations. PULMONARY: No shortness of breath, cough or wheeze. GASTROINTESTINAL: No abdominal pain, nausea, vomiting or diarrhea, melena or bright red blood per rectum. GENITOURINARY: No urinary frequency, urgency, hesitancy or dysuria. MUSCULOSKELETAL: No joint or muscle pain, no back pain, no recent trauma. DERMATOLOGIC: No rash, no itching, no lesions. ENDOCRINE: No polyuria, polydipsia, no heat or cold intolerance. No recent change in weight. HEMATOLOGICAL: No anemia or easy bruising or  bleeding. NEUROLOGIC: No headache, seizures, numbness, tingling or weakness. PSYCHIATRIC: No depression, no loss of interest in normal activity or change in sleep pattern.     Exam:   BP 128/80   Pulse 79   Ht 5' 4.5" (1.638 m)   Wt 270 lb (122.5 kg)   LMP 12/29/2012   SpO2 97%   BMI 45.63 kg/m   Body mass index is 45.63 kg/m.  General appearance : Well developed well nourished female. No acute distress HEENT: Eyes: no retinal hemorrhage or exudates,  Neck supple, trachea midline, no carotid bruits, no thyroidmegaly Lungs: Clear to auscultation, no rhonchi or wheezes, or rib retractions  Heart: Regular rate and rhythm, no murmurs or gallops Breast:Examined in sitting and supine position were symmetrical in appearance, no palpable masses or tenderness,  no skin retraction, no nipple inversion, no nipple discharge, no skin discoloration, no axillary or supraclavicular lymphadenopathy Abdomen: no palpable masses or tenderness, no rebound or guarding Extremities: no edema or skin discoloration or tenderness  Pelvic: Vulva: Normal             Vagina: No gross lesions or discharge  Cervix: Endocervical Polyp visible at EO.  Pap reflex done.  Polyp excised with a fenestrated clamp.  Silver Nitrate at the base.  Specimen sent to Patho.  Uterus  Intermediate,  normal size, shape and consistency, non-tender and mobile  Adnexa  Without masses or tenderness  Anus: Normal   Assessment/Plan:  60 y.o. female for annual exam   1. Encounter for routine gynecological examination with Papanicolaou smear of cervix Postmenopause, well on HRT x 6 years.  Hot flushes and night sweats mostly controled on current dosage.  No PMB.  No pelvic pain.  No pain with IC.  Pap 08/2017 Neg.  Pap reflex done today.  Urine/BMs wnl.  Breasts wnl. Screening mammo 04/11/2020 Neg.  BMI 45.63. Physically active.  Health labs with Fam MD. DEXA 09/28/18 very mild Osteopenia only at the Rt Femoral Neck with T-Score at -1.1, all  other sites normal.  Recommend repeating at 5 yrs. [place] 08/22/11.  Health labs with Fam MD. - Cytology - PAP( [facility])  2. Post-menopause on HRT (hormone replacement therapy) Well on HRT.  No PMB.  No CI to continue.  Estradiol 1 mg PO daily and Prometrium 100 mg PO HS prescriptions sent to pharmacy.  3. Cervical polyp Endocervical Polyp excised.  Specimen sent to patho. Hemostasis with Silver Nitrate. - Surgical pathology( [facility]/ [facility])  4. Osteopenia of neck of right femur Very mild localized Osteopenia.  Repeat BD at 5 yrs.  5. Class 3 severe obesity due to excess calories with serious comorbidity and body mass index (BMI) of 45.0 to 49.9 in adult [facility]) Lower calorie/carb diet.  Fitness activities.   Other orders - VITAMIN D PO; Take by mouth. - aspirin 81 MG EC tablet; Take by mouth. - progesterone (PROMETRIUM) 100 MG capsule; Take 1 capsule (100 mg total) by mouth at bedtime. - estradiol (ESTRACE) 1 MG tablet; Take 1 tablet (1 mg total) by mouth daily.   Princess Bruins MD, 3:19 PM 01/07/2021

## 2021-01-08 LAB — CYTOLOGY - PAP: Diagnosis: NEGATIVE

## 2021-01-09 LAB — SURGICAL PATHOLOGY

## 2021-01-25 ENCOUNTER — Other Ambulatory Visit: Payer: Self-pay

## 2021-01-25 ENCOUNTER — Encounter: Payer: Self-pay | Admitting: Allergy & Immunology

## 2021-01-25 ENCOUNTER — Ambulatory Visit: Payer: Commercial Managed Care - PPO | Admitting: Allergy & Immunology

## 2021-01-25 VITALS — BP 134/88 | HR 88 | Temp 97.5°F | Resp 16 | Ht 65.0 in | Wt 267.4 lb

## 2021-01-25 DIAGNOSIS — J4520 Mild intermittent asthma, uncomplicated: Secondary | ICD-10-CM | POA: Diagnosis not present

## 2021-01-25 DIAGNOSIS — J302 Other seasonal allergic rhinitis: Secondary | ICD-10-CM | POA: Diagnosis not present

## 2021-01-25 DIAGNOSIS — J3089 Other allergic rhinitis: Secondary | ICD-10-CM | POA: Diagnosis not present

## 2021-01-25 DIAGNOSIS — J069 Acute upper respiratory infection, unspecified: Secondary | ICD-10-CM | POA: Diagnosis not present

## 2021-01-25 MED ORDER — CARBINOXAMINE MALEATE 6 MG PO TABS: 6.0000 mg | ORAL_TABLET | Freq: Three times a day (TID) | ORAL | 5 refills | Status: DC

## 2021-01-25 MED ORDER — MONTELUKAST SODIUM 10 MG PO TABS
10.0000 mg | ORAL_TABLET | Freq: Every day | ORAL | 5 refills | Status: DC
Start: 2021-01-25 — End: 2021-07-30

## 2021-01-25 NOTE — Progress Notes (Signed)
FOLLOW UP  Date of Service/Encounter:  01/25/21   Assessment:   Seasonal and perennial allergic rhinitis (grasses, ragweed, trees, outdoor molds, and cockroach) - doing well on the current regimen  Viral URI - recommended pseudoephedrine and Mucinex   Mild intermittent asthma, uncomplicated  Plan/Recommendations:    1. Seasonal and perennial allergic rhinitis (grasses, ragweed, trees, outdoor molds, and cockroach) - It seems that everything is under good control.  - Continue taking: Ryvent (carbinoxamine) 6mg  tablet 3-4 times daily as needed, Singulair (montelukast) 10mg  daily, and Xhance (fluticasone) 1-2 sprays per nostril twice daily - Samples of Mucinex provided today to control your cold like symptoms.   2. Mild intermittent asthma, uncomplicated - I am not sure if you have asthma. - in any case it does not seem that you need a controller medication at this time.  3. Return in about 6 months (around 07/26/2021).    Subjective:   Allison Frazier is a 60 y.o. female presenting today for follow up of  Chief Complaint  Patient presents with   Headache   Sinusitis    Patient states has had a head cold that started yesterday but today it feels worse. No cough, just feels congested and has a head ache.    Allison Frazier has a history of the following: Patient Active Problem List   Diagnosis Date Noted   Acute focal neurological deficit 12/12/2017   Left facial numbness 12/12/2017   Acute focal neurologic deficit with partial resolution 12/12/2017   Near syncope 02/17/2013   Morbid obesity ([place]) 07/11/2012   Overweight(278.02) 06/23/2012   Fibroid uterus 05/05/2011   High cholesterol     History obtained from: chart review and patient.  Allison Frazier is a 60 y.o. female presenting for a follow up visit.  She was last seen in September 2022.  At that time, she had testing that was positive to grasses, ragweed, trees, molds, cockroach.  We stopped all of her current  medications since they were not working and started RyVent, Xhance, and montelukast.  For her asthma, I was not even convinced that she had it.  She clearly did not need a controller medication.  We did not do a spirometry.  Since last visit, she has done well.  Allergic Rhinitis Symptom History: She is doing the [place] and is now feeling  much better.  She remains on the montelukast and the RyVent.  The 2 tablets were affordable.  The [place] Perna is $50.  Initially, she was quoted $650.  She feels like it works much better than the [employer], another no sprays that she has tried.  She has not needed any antibiotics.  She feels like a new person.  She does not think she needs allergy shots at this point.  Work is going well.  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  Otherwise, there have been no changes to her past medical history, surgical history, family history, or social history.    Review of Systems  Constitutional: Negative.  Negative for chills, fever, malaise/fatigue and weight loss.  HENT:  Positive for congestion. Negative for ear discharge, ear pain and sinus pain.   Eyes:  Negative for pain, discharge and redness.  Respiratory:  Negative for cough, sputum production, shortness of breath and wheezing.   Cardiovascular: Negative.  Negative for chest pain and palpitations.  Gastrointestinal:  Negative for abdominal pain, constipation, diarrhea, heartburn, nausea and vomiting.  Skin: Negative.  Negative  for itching and rash.  Neurological:  Negative for dizziness and headaches.  Endo/Heme/Allergies:  Positive for environmental allergies. Does not bruise/bleed easily.      Objective:   Blood pressure 134/88, pulse 88, temperature (!) 97.5 F (36.4 C), temperature source Temporal, resp. rate 16, height 5\' 5"  (1.651 m), weight 267 lb 6.4 oz (121.3 kg), last menstrual period 12/29/2012, SpO2 97 %. Body mass index is 44.5  kg/m.   Physical Exam:  Physical Exam Vitals reviewed.  Constitutional:      Appearance: She is well-developed.  HENT:     Head: Normocephalic and atraumatic.     Right Ear: Tympanic membrane, ear canal and external ear normal. No drainage, swelling or tenderness. Tympanic membrane is not injected, scarred, erythematous, retracted or bulging.     Left Ear: Tympanic membrane, ear canal and external ear normal. No drainage, swelling or tenderness. Tympanic membrane is not injected, scarred, erythematous, retracted or bulging.     Nose: Rhinorrhea present. No nasal deformity, septal deviation or mucosal edema.     Right Turbinates: Enlarged, swollen and pale.     Left Turbinates: Enlarged, swollen and pale.     Right Sinus: No maxillary sinus tenderness or frontal sinus tenderness.     Left Sinus: No maxillary sinus tenderness or frontal sinus tenderness.     Comments: Polypoid sinus degeneration.    Mouth/Throat:     Mouth: Mucous membranes are not pale and not dry.     Pharynx: Uvula midline.  Eyes:     General:        Right eye: No discharge.        Left eye: No discharge.     Conjunctiva/sclera: Conjunctivae normal.     Right eye: Right conjunctiva is not injected. No chemosis.    Left eye: Left conjunctiva is not injected. No chemosis.    Pupils: Pupils are equal, round, and reactive to light.  Cardiovascular:     Rate and Rhythm: Normal rate and regular rhythm.     Heart sounds: Normal heart sounds.  Pulmonary:     Effort: Pulmonary effort is normal. No tachypnea, accessory muscle usage or respiratory distress.     Breath sounds: Normal breath sounds. No wheezing, rhonchi or rales.     Comments: Moving air well in all lung fields. No increased work of breathing noted.  Chest:     Chest wall: No tenderness.  Abdominal:     Tenderness: There is no abdominal tenderness. There is no guarding or rebound.  Lymphadenopathy:     Head:     Right side of head: No submandibular,  tonsillar or occipital adenopathy.     Left side of head: No submandibular, tonsillar or occipital adenopathy.     Cervical: No cervical adenopathy.  Skin:    General: Skin is warm.     Capillary Refill: Capillary refill takes less than 2 seconds.     Coloration: Skin is not pale.     Findings: No abrasion, erythema, petechiae or rash. Rash is not papular, urticarial or vesicular.  Neurological:     Mental Status: She is alert.  Psychiatric:        Behavior: Behavior is cooperative.     Diagnostic studies: none     Salvatore Marvel, MD  [facility] [place]

## 2021-01-25 NOTE — Patient Instructions (Addendum)
1. Seasonal and perennial allergic rhinitis (grasses, ragweed, trees, outdoor molds, and cockroach) - It seems that everything is under good control.  - Continue taking: Ryvent (carbinoxamine) 6mg  tablet 3-4 times daily as needed, Singulair (montelukast) 10mg  daily, and Xhance (fluticasone) 1-2 sprays per nostril twice daily - Samples of Mucinex provided today to control your cold like symptoms.   2. Mild intermittent asthma, uncomplicated - I am not sure if you have asthma. - in any case it does not seem that you need a controller medication at this time.  3. Return in about 6 months (around 07/26/2021).    Please inform us of any Emergency Department visits, hospitalizations, or changes in symptoms. Call us before going to the ED for breathing or allergy symptoms since we might be able to fit you in for a sick visit. Feel free to contact us anytime with any questions, problems, or concerns.  It was a pleasure to see you again today!  Websites that have reliable patient information: 1. American Academy of Asthma, Allergy, and Immunology: www.aaaai.org 2. Food Allergy Research and Education (FARE): foodallergy.org 3. Mothers of Asthmatics: http://www.asthmacommunitynetwork.org 4. American College of Allergy, Asthma, and Immunology: www.acaai.org   COVID-19 Vaccine Information can be found at: ShippingScam.co.uk For questions related to vaccine distribution or appointments, please email [email or web address] or call [phone].   We realize that you might be concerned about having an allergic reaction to the COVID19 vaccines. To help with that concern, WE ARE OFFERING THE COVID19 VACCINES IN OUR OFFICE! Ask the front desk for dates!     Like [place] on [place] and Instagram for our latest updates!      A healthy democracy works best when [employer] participate! Make sure you are registered to vote! If you have moved or changed  any of your contact information, you will need to get this updated before voting!  In some cases, you MAY be able to register to vote online: [email or web address]

## 2021-04-12 ENCOUNTER — Telehealth: Payer: Self-pay | Admitting: Allergy & Immunology

## 2021-04-12 NOTE — Telephone Encounter (Signed)
[place] - spoke to [place] - asked if there were any other options available for patient since Ryvent is not covered by her new insurance or a PA - Tameka advised no, she is receiving NDC/Product is not covered by insurance when entering patient insurance information. ?

## 2021-04-12 NOTE — Telephone Encounter (Signed)
Patient states she reached out pharmacy and requested refill for her ryvent. Patient was told by pharmacy ryvent is not covered by her new insurance. Patient would like something different sent in.  ? ?Please advise ?[facility] [place] [facility].  ?

## 2021-04-17 MED ORDER — CARBINOXAMINE MALEATE 4 MG PO TABS: 4.0000 mg | ORAL_TABLET | Freq: Three times a day (TID) | ORAL | 5 refills | Status: DC | PRN

## 2021-04-17 NOTE — Telephone Encounter (Signed)
Left message for patient to call the office in regards to this medication change.  ?

## 2021-04-17 NOTE — Telephone Encounter (Signed)
Let's try carbinoxamine '4mg'$  every 8 hrs as needed. I sent in the script.  ? ?Salvatore Marvel, MD ?[facility] [place] [facility] ? ?

## 2021-04-18 ENCOUNTER — Encounter: Payer: Self-pay | Admitting: Obstetrics & Gynecology

## 2021-04-18 NOTE — Telephone Encounter (Signed)
Called and left a voicemail asking for patient to return call to discuss change in medication.  ?

## 2021-04-18 NOTE — Telephone Encounter (Signed)
Patient has been informed.

## 2021-05-20 ENCOUNTER — Other Ambulatory Visit: Payer: Self-pay

## 2021-05-20 MED ORDER — XHANCE 93 MCG/ACT NA EXHU: 2.0000 | INHALANT_SUSPENSION | Freq: Two times a day (BID) | NASAL | 3 refills | Status: DC

## 2021-06-05 ENCOUNTER — Telehealth: Payer: Self-pay

## 2021-06-05 DIAGNOSIS — N950 Postmenopausal bleeding: Secondary | ICD-10-CM

## 2021-06-05 NOTE — Telephone Encounter (Signed)
Allison Bruins, MD  You 34 minutes ago (9:57 AM)  ? ?Schedule a Pelvic US with me.  ? ?

## 2021-06-05 NOTE — Telephone Encounter (Signed)
Menopausal. C/o bleeding like a period started yesterday. ? ?What would you like me to schedule for her? ?

## 2021-06-05 NOTE — Telephone Encounter (Signed)
Spoke with patient and informed her.  She knows appt desk will call her to arrange appt. Order placed. ?

## 2021-06-07 NOTE — Telephone Encounter (Signed)
FYI. Scheduled for 07/04/21.  ?

## 2021-07-04 ENCOUNTER — Ambulatory Visit: Payer: BLUE CROSS/BLUE SHIELD | Admitting: Obstetrics & Gynecology

## 2021-07-04 ENCOUNTER — Ambulatory Visit (INDEPENDENT_AMBULATORY_CARE_PROVIDER_SITE_OTHER): Payer: BLUE CROSS/BLUE SHIELD

## 2021-07-04 ENCOUNTER — Encounter: Payer: Self-pay | Admitting: Obstetrics & Gynecology

## 2021-07-04 ENCOUNTER — Other Ambulatory Visit: Payer: Self-pay | Admitting: Obstetrics & Gynecology

## 2021-07-04 VITALS — BP 120/78

## 2021-07-04 DIAGNOSIS — N950 Postmenopausal bleeding: Secondary | ICD-10-CM

## 2021-07-04 DIAGNOSIS — Z79890 Hormone replacement therapy (postmenopausal): Secondary | ICD-10-CM

## 2021-07-04 DIAGNOSIS — R9389 Abnormal findings on diagnostic imaging of other specified body structures: Secondary | ICD-10-CM | POA: Diagnosis not present

## 2021-07-04 NOTE — Progress Notes (Signed)
    Allison Frazier [birth year] [id number]        61 y.o.  G3P3003   RP: PMB on HRT for Pelvic US  HPI: PMB x 5 days in 05/2021.  Mild dark vaginal bleeding.  No pelvic pain.  Postmenopause on HRT with Estradiol 1 mg PO daily and Prometrium 100 mg PO HS.  Controlling hot flushes and night sweats.   OB History  Gravida Para Term Preterm AB Living  '3 3 3     3  '$ SAB IAB Ectopic Multiple Live Births          3    # Outcome Date GA Lbr Len/2nd Weight Sex Delivery Anes PTL Lv  3 Term     F CS-Unspec  N LIV  2 Term     M CS-Unspec  N LIV  1 Term     F Vag-Spont  N LIV    Past medical history,surgical history, problem list, medications, allergies, family history and social history were all reviewed and documented in the EPIC chart.   Directed ROS with pertinent positives and negatives documented in the history of present illness/assessment and plan.  Exam:  Vitals:   07/04/21 0850  BP: 120/78   General appearance:  Normal  Pelvic US today: T/a and T/V images.  Anteverted enlarged uterus with 1 partially calcified intramural fibroid measuring 1.5 x 1.7 cm.  3 subserosal fibroids noted the largest measuring 2.8 x 2.3 cm.  The overall uterine size is measured at 11.34 x 6.03 x 4.19 cm.  Thickened endometrial lining measured at 7.6 mm with an echogenic area at the fundal portion of the canal with a feeder vessel measuring approximately 1.2 x 0.6 cm.  Compatible with a probable endometrial polyp.  The area is difficult to see on 3D and transvaginal images due to uterine lie.  Both ovaries are atrophic in appearance, within normal limits.  No adnexal mass.  No free fluid in the pelvis.   Assessment/Plan:  61 y.o. G3P3003   1. Post-menopausal bleeding PMB x 5 days in 05/2021.  Mild dark vaginal bleeding.  No pelvic pain.  Postmenopause on HRT with Estradiol 1 mg PO daily and Prometrium 100 mg PO HS.  Controlling hot flushes and night sweats.  Pelvic US findings thoroughly reviewed with  patient.  Endometrial line thickened with a probable endometrial polyp.  Given the sub optimal visualization, decision to proceed with a Sonohysto at f/u.  Pelvic US also showing Uterine Fibroids and normal ovaries. - [place] Sonohysterogram; Future  2. Post-menopause on HRT (hormone replacement therapy) Will decrease her Estrogen to 1/2 a tab 0.5 mg daily (1 mg/tab) and continue Progesterone 100 mg HS.  3. Thickened endometrium Probable endometrial Polyp.  F/U Sonohysto, possible EBx.  [place] briefly discussed. - [place] Sonohysterogram; Future   Counseling and management of thickened Endometrium/Polyp/Fibroids for 15 minutes.  Princess Bruins MD, 9:43 AM 07/04/2021

## 2021-07-30 ENCOUNTER — Other Ambulatory Visit: Payer: Self-pay | Admitting: Allergy & Immunology

## 2021-08-05 LAB — COLOGUARD: COLOGUARD: NEGATIVE

## 2021-08-22 ENCOUNTER — Other Ambulatory Visit: Payer: BLUE CROSS/BLUE SHIELD | Admitting: Obstetrics & Gynecology

## 2021-08-22 ENCOUNTER — Other Ambulatory Visit: Payer: BLUE CROSS/BLUE SHIELD

## 2021-09-03 ENCOUNTER — Ambulatory Visit
Admission: RE | Admit: 2021-09-03 | Discharge: 2021-09-03 | Disposition: A | Discharge status: Home / Self Care | Payer: BLUE CROSS/BLUE SHIELD | Source: Ambulatory Visit | Attending: Family Medicine | Admitting: Family Medicine

## 2021-09-03 VITALS — BP 135/81 | HR 82 | Temp 98.7°F | Resp 18

## 2021-09-03 DIAGNOSIS — J22 Unspecified acute lower respiratory infection: Secondary | ICD-10-CM | POA: Diagnosis not present

## 2021-09-03 MED ORDER — AZITHROMYCIN 250 MG PO TABS: ORAL_TABLET | ORAL | 0 refills | Status: DC

## 2021-09-03 MED ORDER — PROMETHAZINE-DM 6.25-15 MG/5ML PO SYRP: 5.0000 mL | ORAL_SOLUTION | Freq: Four times a day (QID) | ORAL | 0 refills | Status: DC | PRN

## 2021-09-03 NOTE — ED Triage Notes (Signed)
Pt reports cough, chest congestion and sore throat x 4 weeks. Mucinex gives some relief.

## 2021-09-03 NOTE — ED Provider Notes (Signed)
[facility]    CSN: [id number] Arrival date & time: 09/03/21  0910      History   Chief Complaint Chief Complaint  Patient presents with   Cough    Entered by patient   Appointment    0930    HPI Allison Frazier is a 61 y.o. female.   Presenting today with several week history of progressively worsening productive cough, chest tightness, congestion, sinus pain and pressure, sore throat, hoarseness.  States the congestion got worse the past day or so and she is now feeling achy as well.  Denies shortness of breath, abdominal pain, nausea vomiting or diarrhea.  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  Taking Mucinex, Delsym with minimal relief.   Past Medical History:  Diagnosis Date   Chondromalacia of both patellae    High cholesterol     Patient Active Problem List   Diagnosis Date Noted   Acute focal neurological deficit 12/12/2017   Left facial numbness 12/12/2017   Acute focal neurologic deficit with partial resolution 12/12/2017   Near syncope 02/17/2013   Morbid obesity ([place]) 07/11/2012   Overweight(278.02) 06/23/2012   Fibroid uterus 05/05/2011   High cholesterol     Past Surgical History:  Procedure Laterality Date   CESAREAN SECTION  02/10/1994   CESAREAN SECTION  02/11/1996   CHOLECYSTECTOMY  02/03/2020   ENDOMETRIAL ABLATION  04/04/2007   HER OPTION   LASIX     REFRACTIVE SURGERY-  RIGHT EYE   TUBAL LIGATION  02/11/1996    OB History     Gravida  3   Para  3   Term  3   Preterm      AB      Living  3      SAB      IAB      Ectopic      Multiple      Live Births  3            Home Medications    Prior to Admission medications   Medication Sig Start Date End Date Taking? Authorizing Provider  azithromycin (ZITHROMAX) 250 MG tablet Take first 2 tablets together, then 1 every day until finished. 09/03/21  Yes Volney American, PA-C  promethazine-dextromethorphan (PROMETHAZINE-DM)  6.25-15 MG/5ML syrup Take 5 mLs by mouth 4 (four) times daily as needed. 09/03/21  Yes Volney American, PA-C  aspirin 81 MG EC tablet Take by mouth.    [provider]  Carbinoxamine Maleate 4 MG TABS Take 1 tablet (4 mg total) by mouth every 8 (eight) hours as needed. 04/17/21   Valentina Shaggy, MD  Cyanocobalamin (VITAMIN B 12 PO) Take by mouth.    [provider]  doxylamine, Sleep, (UNISOM) 25 MG tablet Take 25 mg by mouth at bedtime.    [provider]  estradiol (ESTRACE) 1 MG tablet Take 1 tablet (1 mg total) by mouth daily. Patient taking differently: Take 0.5 mg by mouth daily. 01/07/21   Princess Bruins, MD  montelukast (SINGULAIR) 10 MG tablet TAKE 1 TABLET BY MOUTH AT BEDTIME 07/30/21   Valentina Shaggy, MD  progesterone (PROMETRIUM) 100 MG capsule Take 1 capsule (100 mg total) by mouth at bedtime. 01/07/21   Princess Bruins, MD  psyllium (METAMUCIL SMOOTH TEXTURE) 28 % packet Take 1 packet by mouth 2 (two) times daily.    [provider]  Truett Perna 30 MCG/ACT [place] 2 applicators into the nose  2 (two) times daily. 05/20/21   Valentina Shaggy, MD    Family History Family History  Problem Relation Age of Onset   Heart disease Father    Cancer Father        Prostate    Social History Social History   Tobacco Use   Smoking status: Never   Smokeless tobacco: Never  Vaping Use   Vaping Use: Never used  Substance Use Topics   Alcohol use: Not Currently    Comment: social   Drug use: Never     Allergies   Other and Simvastatin   Review of Systems Review of Systems Per HPI  Physical Exam Triage Vital Signs ED Triage Vitals  Enc Vitals Group     BP 09/03/21 0936 135/81     Pulse Rate 09/03/21 0936 82     Resp 09/03/21 0936 18     Temp 09/03/21 0936 98.7 F (37.1 C)     Temp Source 09/03/21 0936 Oral     SpO2 09/03/21 0936 95 %     Weight --      Height --      Head Circumference --      Peak Flow  --      Pain Score 09/03/21 0935 0     Pain Loc --      Pain Edu? --      Excl. in [place]? --    No data found.  Updated Vital Signs BP 135/81 (BP Location: Right Arm)   Pulse 82   Temp 98.7 F (37.1 C) (Oral)   Resp 18   LMP 12/29/2012 Comment: BTL  SpO2 95%   Visual Acuity Right Eye Distance:   Left Eye Distance:   Bilateral Distance:    Right Eye Near:   Left Eye Near:    Bilateral Near:     Physical Exam Vitals and nursing note reviewed.  Constitutional:      Appearance: Normal appearance. She is not ill-appearing.  HENT:     Head: Atraumatic.     Right Ear: Tympanic membrane and external ear normal.     Left Ear: Tympanic membrane and external ear normal.     Nose: Congestion present.     Mouth/Throat:     Mouth: Mucous membranes are moist.     Pharynx: Posterior oropharyngeal erythema present.  Eyes:     Extraocular Movements: Extraocular movements intact.     Conjunctiva/sclera: Conjunctivae normal.  Cardiovascular:     Rate and Rhythm: Normal rate and regular rhythm.     Heart sounds: Normal heart sounds.  Pulmonary:     Effort: Pulmonary effort is normal.     Breath sounds: Normal breath sounds. No wheezing or rales.  Musculoskeletal:        General: Normal range of motion.     Cervical back: Normal range of motion and neck supple.  Skin:    General: Skin is warm and dry.  Neurological:     Mental Status: She is alert and oriented to person, place, and time.  Psychiatric:        Mood and Affect: Mood normal.        Thought Content: Thought content normal.        Judgment: Judgment normal.      UC Treatments / Results  Labs (all labs ordered are listed, but only abnormal results are displayed) Labs Reviewed  COVID-19, FLU A+B NAA    EKG   Radiology No results found.  Procedures Procedures (including critical care time)  Medications Ordered in UC Medications - No data to display  Initial Impression / Assessment and Plan / UC Course   I have reviewed the triage vital signs and the nursing notes.  Pertinent labs & imaging results that were available during my care of the patient were reviewed by me and considered in my medical decision making (see chart for details).     Vital signs benign and reassuring, suspect progressive upper respiratory infection now with significant chest congestion, hacking cough.  We will treat with azithromycin, Phenergan DM and test for COVID and flu as her symptoms recently worsened in case new viral infection on top of lingering infection.  Discussed abortive care and return precautions.  Final Clinical Impressions(s) / UC Diagnoses   Final diagnoses:  Lower respiratory infection   Discharge Instructions   None    ED Prescriptions     Medication Sig Dispense Auth. Provider   azithromycin (ZITHROMAX) 250 MG tablet Take first 2 tablets together, then 1 every day until finished. 6 tablet Volney American, [place]   promethazine-dextromethorphan (PROMETHAZINE-DM) 6.25-15 MG/5ML syrup Take 5 mLs by mouth 4 (four) times daily as needed. 100 mL Volney American, [place]      PDMP not reviewed this encounter.   Volney American, [place] 09/03/21 1030

## 2021-09-04 LAB — COVID-19, FLU A+B NAA
Influenza A, NAA: NOT DETECTED
Influenza B, NAA: NOT DETECTED
SARS-CoV-2, NAA: NOT DETECTED

## 2021-09-10 ENCOUNTER — Other Ambulatory Visit: Payer: Self-pay | Admitting: Allergy & Immunology

## 2021-09-24 ENCOUNTER — Ambulatory Visit (INDEPENDENT_AMBULATORY_CARE_PROVIDER_SITE_OTHER): Payer: BLUE CROSS/BLUE SHIELD | Admitting: Obstetrics & Gynecology

## 2021-09-24 ENCOUNTER — Ambulatory Visit: Payer: BLUE CROSS/BLUE SHIELD

## 2021-09-24 ENCOUNTER — Other Ambulatory Visit: Payer: Self-pay | Admitting: Obstetrics & Gynecology

## 2021-09-24 VITALS — BP 124/88 | HR 85

## 2021-09-24 DIAGNOSIS — N841 Polyp of cervix uteri: Secondary | ICD-10-CM

## 2021-09-24 DIAGNOSIS — R9389 Abnormal findings on diagnostic imaging of other specified body structures: Secondary | ICD-10-CM | POA: Diagnosis not present

## 2021-09-24 DIAGNOSIS — N950 Postmenopausal bleeding: Secondary | ICD-10-CM

## 2021-09-24 DIAGNOSIS — N840 Polyp of corpus uteri: Secondary | ICD-10-CM

## 2021-09-24 NOTE — Progress Notes (Signed)
Allison Frazier 1960/10/30 [id number]        61 y.o.  G3P3L3   RP: PMB with thickened endometrial line for Sonohysto  HPI:  PMB x 5 days in 05/2021.  Mild dark vaginal bleeding.  No pelvic pain.  Postmenopause on HRT with Estradiol 0.5 mg PO daily and Prometrium 100 mg PO HS.  Controlling hot flushes and night sweats.  Pelvic US 07/04/21 showed a thickened endometrial line at 7.6 mm with a probable Endometrial Polyp.  H/O Benign Endocervical Polyp removed with Annual Gyn exam 01/07/21.   OB History  Gravida Para Term Preterm AB Living  '3 3 3     3  '$ SAB IAB Ectopic Multiple Live Births          3    # Outcome Date GA Lbr Len/2nd Weight Sex Delivery Anes PTL Lv  3 Term     F CS-Unspec  N LIV  2 Term     M CS-Unspec  N LIV  1 Term     F Vag-Spont  N LIV    Past medical history,surgical history, problem list, medications, allergies, family history and social history were all reviewed and documented in the EPIC chart.   Directed ROS with pertinent positives and negatives documented in the history of present illness/assessment and plan.  Exam:  There were no vitals filed for this visit. General appearance:  Normal  Sono Infusion Hysterogram ( procedure note)   The initial transvaginal ultrasound demonstrated the following:   Enlarged retroflexed uterus with several fibroids.  The largest fibroid was measured at 3.38 cm x 2.45 cm.  The overall uterine size was measured at 10.55 x 5.52 x 4.02 cm.  Thickened endometrial lining again seen at 7 mm with a feeder vessel to the cavity seen posteriorly.  Both ovaries are small with atrophic appearance.  The vaginal scan reveals a 10 x 5 mm mass in the cervical canal with feeder vessel posteriorly.   The speculum  was inserted and the cervix cleansed with Betadine solution after confirming that patient has no allergies.A small sonohysterography catheterwas utilized.  Insertion was facilitated with ring forceps, using a spear-like motion the  catheter was inserted to the fundus of the uterus. The speculum is then removed carefully to avoid dislodging the catheter. The catheter was flushed with sterile saline delete prior to insertion to rid it of small amounts of air.the sterile saline solution was infused into the uterine cavity as a vaginal ultrasound probe was then placed in the vagina for full visualization of the uterine cavity from a transvaginal approach. The following was noted:  Sonohysterogram performed to better outline the endometrial cavity with saline infusion revealing a 11 x 5 mm intracavitary mass.  Otherwise combined wall thickness at 3.4 mm.   The catheter was then removed after retrieving some of the saline from the intrauterine cavity. An endometrial biopsy was not done. Patient tolerated procedure well. She had received a tablet of Aleve for discomfort.                                                                        Assessment/Plan:  61 y.o. G3P3003   1. Post-menopausal bleeding PMB x 5 days in  05/2021.  Mild dark vaginal bleeding.  No pelvic pain.  Postmenopause on HRT with Estradiol 0.5 mg PO daily and Prometrium 100 mg PO HS.  Controlling hot flushes and night sweats.  Pelvic US 07/04/21 showed a thickened endometrial line at 7.6 mm with a probable Endometrial Polyp.  H/O Benign Endocervical Polyp removed with Annual Gyn exam 01/07/21.  Sonohysto today showing an Endometrial Polyp and an Endocervical Polyp. The Endometrial lining is otherwise thin.  Decision to proceed with a [place] Myosure Excision of Polyps/D+C.  [place] pamphlet given.  Scheduled Preop visit.  2. Thickened endometrium D/T an Endometrial Polyp.  3. Endometrial polyp As above.  4. Cervical polyp  As above.  Princess Bruins MD, 3:31 PM 09/24/2021

## 2021-09-25 ENCOUNTER — Telehealth: Payer: Self-pay | Admitting: *Deleted

## 2021-09-25 NOTE — Telephone Encounter (Signed)
-----   Message from Princess Bruins, MD sent at 09/24/2021  3:50 PM EDT ----- Regarding: Schedule Surgery Surgery: CPT [id number] - Hysteroscopy/D&C/Myosure  Diagnosis: N95.0 Postmenopausal Bleeding, N84.0 Endometrial Polyp and Endocervical Polyp  Location: [place]  Status: Outpatient  Time: 30 Minutes  Assistant: N/A  Urgency: First Available  Pre-Op Appointment: To Be Scheduled  Post-Op Appointment(s): 2 Weeks  Time Out Of Work: Day Of Surgery ONLY

## 2021-09-26 NOTE — Telephone Encounter (Signed)
Left message to call Dequon Schnebly, RN at [facility], [phone].  

## 2021-09-27 NOTE — Telephone Encounter (Signed)
Spoke with patient. Reviewed surgery dates. Patient request to proceed with surgery on 10/16/21.  Advised patient I will forward to business office for return call. I will return call once surgery date and time confirmed. Patient verbalizes understanding and is agreeable.   Surgery request sent.

## 2021-10-01 ENCOUNTER — Encounter (HOSPITAL_BASED_OUTPATIENT_CLINIC_OR_DEPARTMENT_OTHER): Payer: Self-pay | Admitting: Obstetrics & Gynecology

## 2021-10-01 ENCOUNTER — Other Ambulatory Visit: Payer: Self-pay

## 2021-10-01 NOTE — Telephone Encounter (Signed)
Spoke with patient regarding surgery benefits. Patient acknowledges understanding of information presented. Patient is aware that benefits presented are professional benefits only. Patient is aware the hospital will call with facility benefits. See account note.    

## 2021-10-01 NOTE — Progress Notes (Signed)
Spoke w/ via phone for pre-op interview---pt Lab needs dos---- none per anesthesia, surgery orders pending              Lab results------echo 12-13-2017 epic, US carotid bil 12-13-08-19 epic COVID test ----09-03-2021   negative  epic, pt took home negative  covid test 09-17-2021 Arrive at -------945 am 10-16-2021 NPO after MN NO Solid Food.  Clear liquids from MN until---845 am Med rec completed Medications to take morning of surgery -----xhance nasal spray Diabetic medication -----n/a Patient instructed no nail polish to be worn day of surgery Patient instructed to bring photo id and insurance card day of surgery Patient aware to have Driver (ride ) / caregiver   husband greg   for 24 hours after surgery  Patient Special Instructions ----pt to ask dr Holley Dexter about  stopping 81 mg aspirin , orders requested dr Dellis Filbert  epic ib Pre-Op special Istructions -----surgery orders requested dr Dellis Filbert epic ib Patient verbalized understanding of instructions that were given at this phone interview. Patient denies shortness of breath, chest pain, fever, cough at this phone interview.

## 2021-10-01 NOTE — Telephone Encounter (Signed)
Call to patient. Per DPR, OK to leave message on voicemail.   Left voicemail requesting a return call to review benefits for Scheduled Surgery with Princess Bruins, MD, [place], [place], [place].

## 2021-10-02 ENCOUNTER — Encounter: Payer: Self-pay | Admitting: Obstetrics & Gynecology

## 2021-10-02 ENCOUNTER — Ambulatory Visit (INDEPENDENT_AMBULATORY_CARE_PROVIDER_SITE_OTHER): Payer: BLUE CROSS/BLUE SHIELD | Admitting: Obstetrics & Gynecology

## 2021-10-02 VITALS — BP 120/74 | HR 86 | Ht 64.25 in | Wt 259.0 lb

## 2021-10-02 DIAGNOSIS — N840 Polyp of corpus uteri: Secondary | ICD-10-CM

## 2021-10-02 NOTE — Progress Notes (Signed)
Allison Frazier May 05, 1960 [id number]        61 y.o.  G3P3L3   RP: Preop [facility] Myosure Excision/D+C for Polyps  HPI: PMB x 5 days in 05/2021.  Mild dark vaginal bleeding.  No pelvic pain.  Postmenopause on HRT with Estradiol 0.5 mg PO daily and Prometrium 100 mg PO HS.  Controlling hot flushes and night sweats.  Pelvic US 07/04/21 showed a thickened endometrial line at 7.6 mm with a probable Endometrial Polyp. Sonohysto confirmed an IU lesion c/w an endometrial polyp and an endocervical polyp. The Endometrial lining was otherwise thin.  H/O Benign Endocervical Polyp removed with Annual Gyn exam 01/07/21.   OB History  Gravida Para Term Preterm AB Living  '3 3 3     3  '$ SAB IAB Ectopic Multiple Live Births          3    # Outcome Date GA Lbr Len/2nd Weight Sex Delivery Anes PTL Lv  3 Term     F CS-Unspec  N LIV  2 Term     M CS-Unspec  N LIV  1 Term     F Vag-Spont  N LIV    Past medical history,surgical history, problem list, medications, allergies, family history and social history were all reviewed and documented in the EPIC chart.   Directed ROS with pertinent positives and negatives documented in the history of present illness/assessment and plan.  Exam:  Vitals:   10/02/21 1327  BP: 120/74  Pulse: 86  SpO2: 96%  Weight: 259 lb (117.5 kg)  Height: 5' 4.25" (1.632 m)   General appearance:  Normal     Sono Infusion Hysterogram ( procedure note)    The initial transvaginal ultrasound demonstrated the following:   Enlarged retroflexed uterus with several fibroids.  The largest fibroid was measured at 3.38 cm x 2.45 cm.  The overall uterine size was measured at 10.55 x 5.52 x 4.02 cm.  Thickened endometrial lining again seen at 7 mm with a feeder vessel to the cavity seen posteriorly.  Both ovaries are small with atrophic appearance.  The vaginal scan reveals a 10 x 5 mm mass in the cervical canal with feeder vessel posteriorly.   The speculum  was inserted and the cervix  cleansed with Betadine solution after confirming that patient has no allergies.A small sonohysterography catheterwas utilized.  Insertion was facilitated with ring forceps, using a spear-like motion the catheter was inserted to the fundus of the uterus. The speculum is then removed carefully to avoid dislodging the catheter. The catheter was flushed with sterile saline delete prior to insertion to rid it of small amounts of air.the sterile saline solution was infused into the uterine cavity as a vaginal ultrasound probe was then placed in the vagina for full visualization of the uterine cavity from a transvaginal approach. The following was noted:  Sonohysterogram performed to better outline the endometrial cavity with saline infusion revealing a 11 x 5 mm intracavitary mass.  Otherwise combined wall thickness at 3.4 mm.   The catheter was then removed after retrieving some of the saline from the intrauterine cavity. An endometrial biopsy was not done. Patient tolerated procedure well. She had received a tablet of Aleve for discomfort.        Assessment/Plan:  61 y.o. G3P3003   1. Endometrial polyp  PMB x 5 days in 05/2021.  Mild dark vaginal bleeding.  No pelvic pain.  Postmenopause on HRT with Estradiol 0.5 mg PO daily and Prometrium  100 mg PO HS.  Controlling hot flushes and night sweats.  Pelvic US 07/04/21 showed a thickened endometrial line at 7.6 mm. Sonohysto confirmed an IU lesion c/w an endometrial polyp and an endocervical polyp. The Endometrial lining was otherwise thin.  H/O Benign Endocervical Polyp removed with Annual Gyn exam 01/07/21. Decision to proceed with a [place] Myosure Excision of Polyps/D+C.  [place] pamphlet given.  Preop preparation, surgery and risks, including the risk of uterine perforation and postop precautions and expectations reviewed thoroughly.                        Patient was counseled as to the risk of surgery to include the following:  1. Infection (prohylactic  antibiotics will be administered)  2. DVT/Pulmonary Embolism (prophylactic pneumo compression stockings will be used)  3.Trauma to internal organs requiring additional surgical procedure to repair any injury to internal organs requiring perhaps additional hospitalization days.  4.Hemmorhage requiring transfusion and blood products which carry risks such as anaphylactic reaction, hepatitis and AIDS  Patient had received literature information on the procedure scheduled and all her questions were answered and fully accepts all risk.   Princess Bruins MD, 1:47 PM 10/02/2021

## 2021-10-02 NOTE — Telephone Encounter (Signed)
Spoke with patient. Surgery date request confirmed.  Advised surgery is scheduled for 10/16/21, [facility] at 1150.  Surgery instruction sheet and hospital brochure reviewed, printed copy provided to patient. Patient verbalizes understanding and is agreeable.  Routing to provider. Encounter closed.

## 2021-10-08 ENCOUNTER — Other Ambulatory Visit: Payer: Self-pay | Admitting: Allergy & Immunology

## 2021-10-09 ENCOUNTER — Encounter: Payer: Self-pay | Admitting: Family

## 2021-10-09 ENCOUNTER — Ambulatory Visit: Payer: BLUE CROSS/BLUE SHIELD | Admitting: Family

## 2021-10-09 ENCOUNTER — Ambulatory Visit (HOSPITAL_COMMUNITY)
Admission: RE | Admit: 2021-10-09 | Discharge: 2021-10-09 | Disposition: A | Discharge status: Home / Self Care | Payer: BLUE CROSS/BLUE SHIELD | Source: Ambulatory Visit | Attending: Family | Admitting: Family

## 2021-10-09 VITALS — BP 142/82 | HR 91 | Temp 98.7°F | Resp 20 | Ht 65.0 in | Wt 261.2 lb

## 2021-10-09 DIAGNOSIS — J4520 Mild intermittent asthma, uncomplicated: Secondary | ICD-10-CM | POA: Diagnosis not present

## 2021-10-09 DIAGNOSIS — R051 Acute cough: Secondary | ICD-10-CM | POA: Insufficient documentation

## 2021-10-09 DIAGNOSIS — J302 Other seasonal allergic rhinitis: Secondary | ICD-10-CM

## 2021-10-09 DIAGNOSIS — J3089 Other allergic rhinitis: Secondary | ICD-10-CM

## 2021-10-09 MED ORDER — AZELASTINE HCL 0.1 % NA SOLN: NASAL | 2 refills | Status: DC

## 2021-10-09 MED ORDER — OMEPRAZOLE 20 MG PO CPDR
20.0000 mg | DELAYED_RELEASE_CAPSULE | Freq: Every day | ORAL | 0 refills | Status: DC
Start: 2021-10-09 — End: 2022-04-11

## 2021-10-09 NOTE — Patient Instructions (Addendum)
1. Seasonal and perennial allergic rhinitis (grasses, ragweed, trees, outdoor molds, and cockroach) - Continue taking: Ryvent (carbinoxamine) '6mg'$  tablet 3-4 times daily as needed, Singulair (montelukast) '10mg'$  daily, and Xhance (fluticasone) 1-2 sprays per nostril twice daily Start saline nasal rinse twice a day to help with drainage.  Use this prior to any medicated nasal sprays Azelastine nasal spray using 1 to 2 sprays each nostril twice a day for drainage down the throat  2. Mild intermittent asthma, uncomplicated - I am not sure if you have asthma. -Your spirometry looks good today and I do not feel that you need a daily inhaler   3.  Acute cough-discussed that this could be due to postviral cough, reflux and/or postnasal drip, We will get a stat chest x-ray due to your cough.  We will call you with results once they are back. Lets try a 30-day trial of omeprazole 20 mg once a day to help with the cough Drink warm tea and a spoonful of honey to help with cough  Please let us know if this treatment plan is not working well for you Schedule a follow-up appointment in 6 months or sooner if needed

## 2021-10-09 NOTE — Progress Notes (Signed)
[place], [address] [place] [address] Dept: [phone]  FOLLOW UP NOTE  Patient ID: Allison Frazier, female    DOB: May 30, 1960  Age: 61 y.o. MRN: [id number] Date of Office Visit: 10/09/2021  Assessment  Chief Complaint: Cough (Has been having a constant cough. Is having a hard time to sleep since the cough is waking her up. Is currently taking promethazine. Can hear wheezing. Has been on several meds and nothing works. I getting headaches from the cough. Is having surgery next Wednesday and hopes it'll clear up soon. Most of the time it is a dry cough and if she coughs something up it is clear. )  HPI Allison Frazier is a 61 year old female who presents today for an acute visit of cough.  She was last seen on January 25, 2021 by Dr. Ernst Bowler for seasonal and perennial allergic rhinitis, viral URI, and mild intermittent asthma.  She denies any new diagnosis or surgery since her last office visit.  Approximately 1 month ago she developed a productive cough with green-gray sputum.  She reports that she was diagnosed with a upper respiratory infection and was given azithromycin.  She continued to have symptoms and saw her primary care physician and was given amoxicillin.  She continued to have cough and she was given prednisone and this did not help.  During this time, she tested for COVID-19 twice and was negative and  her influenza testing was negative.  Now her cough is either dry or productive with clear sputum.  She has tried albuterol and this does not seem to help.  The cough is constant.  She has not had any chest imaging since the cough started.  Throughout this whole 4-week time frame she has not ever had a fever.  She also reports wheezing at night, shortness of breath after coughing, and not being able to sleep at night due to cough.  She denies tightness in her chest.  She has tried using her albuterol 4 times in the past month and it did not help.  She is not on a daily  inhaler.  She thinks that maybe as a child she had a diagnosis of asthma.  She does mention that if she is around a horse or someone has ridden a horse and it can cause her breathing to flare.  She is concerned about having this cough and her having upcoming uterus polyps removed next Wednesday.  She is currently taking promethazine for the cough and Mucinex.  Seasonal and perennial allergic rhinitis: She reports that she has a chronic small amount of nasal congestion.  She also reports postnasal drip, but she is not sure if this is because the cough.  She denies rhinorrhea.  She has not had any sinus infections since we last saw her.  She continues to take RyVent 6 mg twice a day, Singulair 10 mg at night, and Xhance twice a day.  She denies heartburn or reflux symptoms, but does mention that she has had reflux in the past.   Drug Allergies:  Allergies  Allergen Reactions   Other Swelling    Horses Horses   Simvastatin Other (See Comments)    BODY ACHES    Review of Systems: Review of Systems  Constitutional:  Negative for chills and fever.  HENT:         Reports a small amount of chronic nasal congestion.  Also reports postnasal drip.  Denies rhinorrhea.  Eyes:  Reports a little bit of itchy watery eyes at times  Respiratory:  Positive for cough, shortness of breath and wheezing.        Reports a cough that is now dry and if it is productive it is clear in color.  Also reports shortness of breath and wheezing at night.  She denies tightness in chest.  The cough does wake her up at night.  Albuterol and prednisone have not help with any of these symptoms.  Cardiovascular:  Negative for chest pain and palpitations.  Gastrointestinal:        Denies heartburn or reflux symptoms  Genitourinary:  Negative for frequency.  Skin:  Negative for itching and rash.  Neurological:  Negative for headaches.  Endo/Heme/Allergies:  Positive for environmental allergies.     Physical  Exam: BP (!) 142/82   Pulse 91   Temp 98.7 F (37.1 C)   Resp 20   Ht '5\' 5"'$  (1.651 m)   Wt 261 lb 4 oz (118.5 kg)   LMP 12/29/2012 Comment: BTL  SpO2 96%   BMI 43.47 kg/m    Physical Exam Constitutional:      Appearance: Normal appearance.  HENT:     Head: Normocephalic and atraumatic.     Comments: Pharynx: Slightly erythematous small amount of white postnasal drip present.  Eyes normal, ears normal, nose: Bilateral lower turbinates mildly edematous and slightly erythematous with no drainage noted    Right Ear: Tympanic membrane, ear canal and external ear normal.     Left Ear: Tympanic membrane, ear canal and external ear normal.  Eyes:     Conjunctiva/sclera: Conjunctivae normal.  Cardiovascular:     Rate and Rhythm: Regular rhythm.     Heart sounds: Normal heart sounds.  Pulmonary:     Effort: Pulmonary effort is normal.     Breath sounds: Normal breath sounds.     Comments: Lungs clear to auscultation Musculoskeletal:     Cervical back: Neck supple.  Skin:    General: Skin is warm.  Neurological:     Mental Status: She is alert and oriented to person, place, and time.  Psychiatric:        Mood and Affect: Mood normal.        Behavior: Behavior normal.        Thought Content: Thought content normal.        Judgment: Judgment normal.     Diagnostics: FVC 2.69 L (83%), FEV1 2.21 L (86%).  Predicted FVC 3.26 L, predicted FEV1 2.56 L.  Spirometry indicates normal respiratory function.  Assessment and Plan: 1. Acute cough   2. Seasonal and perennial allergic rhinitis   3. Mild intermittent asthma, uncomplicated     Meds ordered this encounter  Medications   azelastine (ASTELIN) 0.1 % nasal spray    Sig: Place 1 to 2 sprays in each nostril twice a day as needed for drainage down the throat    Dispense:  30 mL    Refill:  2   omeprazole (PRILOSEC) 20 MG capsule    Sig: Take 1 capsule (20 mg total) by mouth daily.    Dispense:  30 capsule    Refill:  0     Patient Instructions  1. Seasonal and perennial allergic rhinitis (grasses, ragweed, trees, outdoor molds, and cockroach) - Continue taking: Ryvent (carbinoxamine) '6mg'$  tablet 3-4 times daily as needed, Singulair (montelukast) '10mg'$  daily, and Xhance (fluticasone) 1-2 sprays per nostril twice daily Start saline nasal rinse twice a day to  help with drainage.  Use this prior to any medicated nasal sprays Azelastine nasal spray using 1 to 2 sprays each nostril twice a day for drainage down the throat  2. Mild intermittent asthma, uncomplicated - I am not sure if you have asthma. -Your spirometry looks good today and I do not feel that you need a daily inhaler   3.  Acute cough-discussed that this could be due to postviral cough, reflux and/or postnasal drip, We will get a stat chest x-ray due to your cough.  We will call you with results once they are back. Lets try a 30-day trial of omeprazole 20 mg once a day to help with the cough Drink warm tea and a spoonful of honey to help with cough  Please let us know if this treatment plan is not working well for you Schedule a follow-up appointment in 6 months or sooner if needed Return in about 6 months (around 04/10/2022), or if symptoms worsen or fail to improve.    Thank you for the opportunity to care for this patient.  Please do not hesitate to contact me with questions.  Allison Charon, FNP [facility]

## 2021-10-10 NOTE — Progress Notes (Signed)
Please let Allison Frazier know that her chest x-ray is normal. This is great news! Sorry for the delay!

## 2021-10-16 ENCOUNTER — Other Ambulatory Visit: Payer: Self-pay

## 2021-10-16 ENCOUNTER — Ambulatory Visit (HOSPITAL_BASED_OUTPATIENT_CLINIC_OR_DEPARTMENT_OTHER): Payer: BLUE CROSS/BLUE SHIELD | Admitting: Anesthesiology

## 2021-10-16 ENCOUNTER — Encounter (HOSPITAL_BASED_OUTPATIENT_CLINIC_OR_DEPARTMENT_OTHER): Payer: Self-pay | Admitting: Obstetrics & Gynecology

## 2021-10-16 ENCOUNTER — Encounter (HOSPITAL_BASED_OUTPATIENT_CLINIC_OR_DEPARTMENT_OTHER)
Admission: RE | Disposition: A | Discharge status: Home / Self Care | Payer: Self-pay | Source: Home / Self Care | Attending: Obstetrics & Gynecology

## 2021-10-16 ENCOUNTER — Ambulatory Visit (HOSPITAL_BASED_OUTPATIENT_CLINIC_OR_DEPARTMENT_OTHER)
Admission: RE | Admit: 2021-10-16 | Discharge: 2021-10-16 | Disposition: A | Discharge status: Home / Self Care | Payer: BLUE CROSS/BLUE SHIELD | Attending: Obstetrics & Gynecology | Admitting: Obstetrics & Gynecology

## 2021-10-16 DIAGNOSIS — N841 Polyp of cervix uteri: Secondary | ICD-10-CM | POA: Diagnosis not present

## 2021-10-16 DIAGNOSIS — Z01818 Encounter for other preprocedural examination: Secondary | ICD-10-CM

## 2021-10-16 DIAGNOSIS — N950 Postmenopausal bleeding: Secondary | ICD-10-CM | POA: Insufficient documentation

## 2021-10-16 DIAGNOSIS — Z79890 Hormone replacement therapy (postmenopausal): Secondary | ICD-10-CM | POA: Diagnosis not present

## 2021-10-16 DIAGNOSIS — Z6841 Body Mass Index (BMI) 40.0 and over, adult: Secondary | ICD-10-CM | POA: Insufficient documentation

## 2021-10-16 DIAGNOSIS — Z79899 Other long term (current) drug therapy: Secondary | ICD-10-CM | POA: Insufficient documentation

## 2021-10-16 DIAGNOSIS — N840 Polyp of corpus uteri: Secondary | ICD-10-CM

## 2021-10-16 DIAGNOSIS — N951 Menopausal and female climacteric states: Secondary | ICD-10-CM | POA: Insufficient documentation

## 2021-10-16 DIAGNOSIS — K219 Gastro-esophageal reflux disease without esophagitis: Secondary | ICD-10-CM | POA: Diagnosis not present

## 2021-10-16 HISTORY — PX: DILATATION & CURETTAGE/HYSTEROSCOPY WITH MYOSURE: SHX6511

## 2021-10-16 HISTORY — DX: Acute upper respiratory infection, unspecified: J06.9

## 2021-10-16 HISTORY — DX: Postmenopausal bleeding: N95.0

## 2021-10-16 HISTORY — DX: Personal history of urinary calculi: Z87.442

## 2021-10-16 LAB — CBC
HCT: 51.9 % — ABNORMAL HIGH (ref 36.0–46.0)
Hemoglobin: 15.9 g/dL — ABNORMAL HIGH (ref 12.0–15.0)
MCH: 28.4 pg (ref 26.0–34.0)
MCHC: 30.6 g/dL (ref 30.0–36.0)
MCV: 92.8 fL (ref 80.0–100.0)
Platelets: 239 10*3/uL (ref 150–400)
RBC: 5.59 MIL/uL — ABNORMAL HIGH (ref 3.87–5.11)
RDW: 13.8 % (ref 11.5–15.5)
WBC: 5.1 10*3/uL (ref 4.0–10.5)
nRBC: 0 % (ref 0.0–0.2)

## 2021-10-16 SURGERY — DILATATION & CURETTAGE/HYSTEROSCOPY WITH MYOSURE
Anesthesia: General | Site: Vagina

## 2021-10-16 MED ORDER — FENTANYL CITRATE (PF) 100 MCG/2ML IJ SOLN
INTRAMUSCULAR | Status: AC
  Filled 2021-10-16: qty 2

## 2021-10-16 MED ORDER — SODIUM CHLORIDE 0.9 % IR SOLN
Status: DC | PRN
  Administered 2021-10-16: 3000 mL

## 2021-10-16 MED ORDER — GLYCOPYRROLATE PF 0.2 MG/ML IJ SOSY
PREFILLED_SYRINGE | INTRAMUSCULAR | Status: DC | PRN
  Administered 2021-10-16: .2 mg via INTRAVENOUS

## 2021-10-16 MED ORDER — LIDOCAINE HCL (PF) 2 % IJ SOLN
INTRAMUSCULAR | Status: AC
  Filled 2021-10-16: qty 5

## 2021-10-16 MED ORDER — CEFAZOLIN SODIUM-DEXTROSE 2-4 GM/100ML-% IV SOLN
INTRAVENOUS | Status: AC
  Filled 2021-10-16: qty 100

## 2021-10-16 MED ORDER — ONDANSETRON HCL 4 MG/2ML IJ SOLN
INTRAMUSCULAR | Status: AC
  Filled 2021-10-16: qty 2

## 2021-10-16 MED ORDER — DEXMEDETOMIDINE (PRECEDEX) IN NS 20 MCG/5ML (4 MCG/ML) IV SYRINGE
PREFILLED_SYRINGE | INTRAVENOUS | Status: DC | PRN
  Administered 2021-10-16: 12 ug via INTRAVENOUS

## 2021-10-16 MED ORDER — ONDANSETRON HCL 4 MG/2ML IJ SOLN
INTRAMUSCULAR | Status: DC | PRN
  Administered 2021-10-16: 4 mg via INTRAVENOUS

## 2021-10-16 MED ORDER — POVIDONE-IODINE 10 % EX SWAB: 2.0000 | Freq: Once | CUTANEOUS | Status: DC

## 2021-10-16 MED ORDER — LIDOCAINE 2% (20 MG/ML) 5 ML SYRINGE
INTRAMUSCULAR | Status: DC | PRN
  Administered 2021-10-16: 100 mg via INTRAVENOUS

## 2021-10-16 MED ORDER — KETOROLAC TROMETHAMINE 30 MG/ML IJ SOLN: 30.0000 mg | Freq: Once | INTRAMUSCULAR | Status: DC | PRN

## 2021-10-16 MED ORDER — LACTATED RINGERS IV SOLN: INTRAVENOUS | Status: DC

## 2021-10-16 MED ORDER — KETOROLAC TROMETHAMINE 30 MG/ML IJ SOLN
INTRAMUSCULAR | Status: DC | PRN
  Administered 2021-10-16: 30 mg via INTRAVENOUS

## 2021-10-16 MED ORDER — ONDANSETRON HCL 4 MG/2ML IJ SOLN: 4.0000 mg | Freq: Once | INTRAMUSCULAR | Status: DC | PRN

## 2021-10-16 MED ORDER — MIDAZOLAM HCL 5 MG/5ML IJ SOLN
INTRAMUSCULAR | Status: DC | PRN
  Administered 2021-10-16: 2 mg via INTRAVENOUS

## 2021-10-16 MED ORDER — OXYCODONE HCL 5 MG/5ML PO SOLN: 5.0000 mg | Freq: Once | ORAL | Status: DC | PRN

## 2021-10-16 MED ORDER — CEFAZOLIN SODIUM-DEXTROSE 2-4 GM/100ML-% IV SOLN
2.0000 g | INTRAVENOUS | Status: AC
  Administered 2021-10-16: 2 g via INTRAVENOUS

## 2021-10-16 MED ORDER — MIDAZOLAM HCL 2 MG/2ML IJ SOLN
INTRAMUSCULAR | Status: AC
  Filled 2021-10-16: qty 2

## 2021-10-16 MED ORDER — LIDOCAINE HCL 1 % IJ SOLN
INTRAMUSCULAR | Status: DC | PRN
  Administered 2021-10-16: 10 mL

## 2021-10-16 MED ORDER — PROPOFOL 10 MG/ML IV BOLUS
INTRAVENOUS | Status: DC | PRN
  Administered 2021-10-16: 50 mg via INTRAVENOUS
  Administered 2021-10-16: 200 mg via INTRAVENOUS

## 2021-10-16 MED ORDER — GLYCOPYRROLATE PF 0.2 MG/ML IJ SOSY
PREFILLED_SYRINGE | INTRAMUSCULAR | Status: AC
  Filled 2021-10-16: qty 1

## 2021-10-16 MED ORDER — OXYCODONE HCL 5 MG PO TABS: 5.0000 mg | ORAL_TABLET | Freq: Once | ORAL | Status: DC | PRN

## 2021-10-16 MED ORDER — DEXAMETHASONE SODIUM PHOSPHATE 10 MG/ML IJ SOLN
INTRAMUSCULAR | Status: AC
  Filled 2021-10-16: qty 1

## 2021-10-16 MED ORDER — FENTANYL CITRATE (PF) 100 MCG/2ML IJ SOLN
25.0000 ug | INTRAMUSCULAR | Status: DC | PRN
  Administered 2021-10-16: 50 ug via INTRAVENOUS

## 2021-10-16 MED ORDER — KETOROLAC TROMETHAMINE 30 MG/ML IJ SOLN
INTRAMUSCULAR | Status: AC
  Filled 2021-10-16: qty 2

## 2021-10-16 MED ORDER — FENTANYL CITRATE (PF) 100 MCG/2ML IJ SOLN
INTRAMUSCULAR | Status: DC | PRN
  Administered 2021-10-16 (×3): 50 ug via INTRAVENOUS

## 2021-10-16 MED ORDER — PROPOFOL 10 MG/ML IV BOLUS
INTRAVENOUS | Status: AC
  Filled 2021-10-16: qty 20

## 2021-10-16 MED ORDER — DEXAMETHASONE SODIUM PHOSPHATE 10 MG/ML IJ SOLN
INTRAMUSCULAR | Status: DC | PRN
  Administered 2021-10-16: 5 mg via INTRAVENOUS

## 2021-10-16 SURGICAL SUPPLY — 17 items
DEVICE MYOSURE REACH (MISCELLANEOUS) IMPLANT
DRSG TELFA 3X8 NADH STRL (GAUZE/BANDAGES/DRESSINGS) ×2 IMPLANT
GAUZE 4X4 16PLY ~~LOC~~+RFID DBL (SPONGE) ×4 IMPLANT
GLOVE BIO SURGEON STRL SZ 6.5 (GLOVE) ×2 IMPLANT
GLOVE BIOGEL PI IND STRL 7.0 (GLOVE) ×4 IMPLANT
GOWN STRL REUS W/ TWL LRG LVL3 (GOWN DISPOSABLE) IMPLANT
GOWN STRL REUS W/TWL LRG LVL3 (GOWN DISPOSABLE) ×5 IMPLANT
IV NS IRRIG 3000ML ARTHROMATIC (IV SOLUTION) ×2 IMPLANT
KIT PROCEDURE FLUENT (KITS) ×2 IMPLANT
KIT TURNOVER CYSTO (KITS) ×2 IMPLANT
PACK VAGINAL MINOR WOMEN LF (CUSTOM PROCEDURE TRAY) ×2 IMPLANT
PAD OB MATERNITY 4.3X12.25 (PERSONAL CARE ITEMS) ×2 IMPLANT
PAD PREP 24X48 CUFFED NSTRL (MISCELLANEOUS) ×2 IMPLANT
SEAL ROD LENS SCOPE MYOSURE (ABLATOR) ×2 IMPLANT
SOL PREP POV-IOD 4OZ 10% (MISCELLANEOUS) IMPLANT
SPIKE FLUID TRANSFER (MISCELLANEOUS) IMPLANT
TOWEL OR 17X26 10 PK STRL BLUE (TOWEL DISPOSABLE) ×2 IMPLANT

## 2021-10-16 NOTE — Discharge Instructions (Addendum)
  Post Anesthesia Home Care Instructions  Activity: Get plenty of rest for the remainder of the day. A responsible individual must stay with you for 24 hours following the procedure.  For the next 24 hours, DO NOT: -Drive a car -Paediatric nurse -Drink alcoholic beverages -Take any medication unless instructed by your physician -Make any legal decisions or sign important papers.  Meals: Start with liquid foods such as gelatin or soup. Progress to regular foods as tolerated. Avoid greasy, spicy, heavy foods. If nausea and/or vomiting occur, drink only clear liquids until the nausea and/or vomiting subsides. Call your physician if vomiting continues.  Special Instructions/Symptoms: Your throat may feel dry or sore from the anesthesia or the breathing tube placed in your throat during surgery. If this causes discomfort, gargle with warm salt water. The discomfort should disappear within 24 hours.  DISCHARGE INSTRUCTIONS: HYSTEROSCOPY  The following instructions have been prepared to help you care for yourself upon your return home.  May take Ibuprofen after 6 PM.   May take stool softner while taking narcotic pain medication to prevent constipation.  Drink plenty of water.  Personal hygiene:  Use sanitary pads for vaginal drainage, not tampons.  Shower the day after your procedure.  NO tub baths, pools or Jacuzzis for 2-3 weeks.  Wipe front to back after using the bathroom.  Activity and limitations:  Do NOT drive or operate any equipment for 24 hours. The effects of anesthesia are still present and drowsiness may result.  Do NOT rest in bed all day.  Walking is encouraged.  Walk up and down stairs slowly.  You may resume your normal activity in one to two days or as indicated by your physician. Sexual activity: NO intercourse for at least 2 weeks after the procedure, or as indicated by your Doctor.  Diet: Eat a light meal as desired this evening. You may resume your usual diet  tomorrow.  Return to Work: You may resume your work activities in one to two days or as indicated by Marine scientist.  What to expect after your surgery: Expect to have vaginal bleeding/discharge for 2-3 days and spotting for up to 10 days. It is not unusual to have soreness for up to 1-2 weeks. You may have a slight burning sensation when you urinate for the first day. Mild cramps may continue for a couple of days. You may have a regular period in 2-6 weeks.  Call your doctor for any of the following:  Excessive vaginal bleeding or clotting, saturating and changing one pad every hour.  Inability to urinate 6 hours after discharge from hospital.  Pain not relieved by pain medication.  Fever of 100.4 F or greater.  Unusual vaginal discharge or odor.

## 2021-10-16 NOTE — Transfer of Care (Signed)
Immediate Anesthesia Transfer of Care Note  Patient: Allison Frazier  Procedure(s) Performed: DILATATION & CURETTAGE/HYSTEROSCOPY WITH MYOSURE (Vagina )  Patient Location: PACU  Anesthesia Type:General  Level of Consciousness: awake, alert , oriented and patient cooperative  Airway & Oxygen Therapy: Patient Spontanous Breathing  Post-op Assessment: Report given to RN and Post -op Vital signs reviewed and stable  Post vital signs: Reviewed and stable  Last Vitals:  Vitals Value Taken Time  BP 127/66 10/16/21 1153  Temp 36.8 C 10/16/21 1153  Pulse 92 10/16/21 1154  Resp 21 10/16/21 1154  SpO2 92 % 10/16/21 1154  Vitals shown include unvalidated device data.  Last Pain:  Vitals:   10/16/21 0950  TempSrc: Oral  PainSc: 0-No pain      Patients Stated Pain Goal: 3 (66/59/93 5701)  Complications: No notable events documented.

## 2021-10-16 NOTE — Anesthesia Preprocedure Evaluation (Signed)
Anesthesia Evaluation  Patient identified by MRN, date of birth, ID band Patient awake    Reviewed: Allergy & Precautions, NPO status , Patient's Chart, lab work & pertinent test results  Airway Mallampati: II  TM Distance: >3 FB Neck ROM: Full    Dental no notable dental hx.    Pulmonary neg pulmonary ROS,    Pulmonary exam normal breath sounds clear to auscultation       Cardiovascular negative cardio ROS Normal cardiovascular exam Rhythm:Regular Rate:Normal     Neuro/Psych negative neurological ROS  negative psych ROS   GI/Hepatic Neg liver ROS, GERD  Medicated,  Endo/Other  Morbid obesity  Renal/GU negative Renal ROS  negative genitourinary   Musculoskeletal negative musculoskeletal ROS (+)   Abdominal   Peds negative pediatric ROS (+)  Hematology negative hematology ROS (+)   Anesthesia Other Findings   Reproductive/Obstetrics negative OB ROS                             Anesthesia Physical Anesthesia Plan  ASA: 2  Anesthesia Plan: General   Post-op Pain Management: Minimal or no pain anticipated   Induction: Intravenous  PONV Risk Score and Plan: 3 and Ondansetron, Dexamethasone and Treatment may vary due to age or medical condition  Airway Management Planned: LMA  Additional Equipment:   Intra-op Plan:   Post-operative Plan: Extubation in OR  Informed Consent: I have reviewed the patients History and Physical, chart, labs and discussed the procedure including the risks, benefits and alternatives for the proposed anesthesia with the patient or authorized representative who has indicated his/her understanding and acceptance.     Dental advisory given  Plan Discussed with: CRNA and Surgeon  Anesthesia Plan Comments:         Anesthesia Quick Evaluation

## 2021-10-16 NOTE — Anesthesia Procedure Notes (Signed)
Procedure Name: LMA Insertion Date/Time: 10/16/2021 11:21 AM  Performed by: Rogers Blocker, CRNAPre-anesthesia Checklist: Patient identified, Emergency Drugs available, Suction available and Patient being monitored Patient Re-evaluated:Patient Re-evaluated prior to induction Oxygen Delivery Method: Circle System Utilized Preoxygenation: Pre-oxygenation with 100% oxygen Induction Type: IV induction Ventilation: Mask ventilation without difficulty LMA: LMA inserted LMA Size: 4.0 Number of attempts: 1 Airway Equipment and Method: Bite block Placement Confirmation: positive ETCO2 Tube secured with: Tape Dental Injury: Teeth and Oropharynx as per pre-operative assessment

## 2021-10-16 NOTE — Op Note (Signed)
Operative Note  10/16/2021  11:47 AM  PATIENT:  Allison Frazier  61 y.o. female  PRE-OPERATIVE DIAGNOSIS:  Postmenopausal bleeding, Endometrial polyp and Endocervical polyp  POST-OPERATIVE DIAGNOSIS:  Postmenopausal bleeding, Endometrial polyps and Endocervical polyps  PROCEDURE:  Procedure(s): DILATATION & CURETTAGE/HYSTEROSCOPY WITH MYOSURE EXCISIONS  SURGEON:  Surgeon(s): Princess Bruins, MD  ANESTHESIA:   general  FINDINGS: 2 endometrial polyps, 2 endocervical polyps  DESCRIPTION OF OPERATION: Under general anesthesia with laryngeal mask, the patient is in lithotomy position.  She is prepped with Betadine on the suprapubic, vulvar and vaginal areas.  She is draped as usual.  Timeout was done.  The vaginal exam reveals a retroverted uterus, normal volume and mobile.  No adnexal mass.  The speculum is inserted in the vagina and the anterior lip of the cervix is grasped with a tenaculum.  Dilation of the cervix with Pratt dilators up to #19 without difficulty.  The hysteroscope is inserted in the intra uterine cavity.  2 endometrial polyps are present from the right posterior wall of the intra uterine cavity.  2 cervical polyps are also present.  Pictures are taken.  We used the MyoSure reach to excise the 2 endometrial polyps and then the 2 endocervical polyps.  Pictures are taken after excisions.  Both ostia are well seen.  The uterine cavity is otherwise normal with a thin endometrial lining.  The hysteroscope is removed with the MyoSure.  We then proceeded with a curettage of the intra uterine cavity on all surfaces with a sharp curette.  All specimens are sent together to pathology.  All instruments are removed.  Hemostasis was adequate.  The patient is brought to recovery room in good and stable status.  ESTIMATED BLOOD LOSS: 5 mL FLUID DEFICIT: 195 mL  Intake/Output Summary (Last 24 hours) at 10/16/2021 1147 Last data filed at 10/16/2021 1145 Gross per 24 hour  Intake 700 ml   Output 5 ml  Net 695 ml     BLOOD ADMINISTERED: None   LOCAL MEDICATIONS USED:  LIDOCAINE   SPECIMEN:  Source of Specimen:  Endometrial and endocervical polyps, endometrial curettings  DISPOSITION OF SPECIMEN:  PATHOLOGY  COUNTS:  YES  PLAN OF CARE: Transfer to PACU  Marie-Lyne LavoieMD11:47 AM

## 2021-10-16 NOTE — Anesthesia Postprocedure Evaluation (Signed)
Anesthesia Post Note  Patient: Allison Frazier  Procedure(s) Performed: DILATATION & CURETTAGE/HYSTEROSCOPY WITH MYOSURE (Vagina )     Patient location during evaluation: PACU Anesthesia Type: General Level of consciousness: awake and alert Pain management: pain level controlled Vital Signs Assessment: post-procedure vital signs reviewed and stable Respiratory status: spontaneous breathing, nonlabored ventilation, respiratory function stable and patient connected to nasal cannula oxygen Cardiovascular status: blood pressure returned to baseline and stable Postop Assessment: no apparent nausea or vomiting Anesthetic complications: no   No notable events documented.  Last Vitals:  Vitals:   10/16/21 0950 10/16/21 1153  BP: (!) 143/75 127/66  Pulse: (!) 18 88  Resp: 18 13  Temp: 36.8 C 36.8 C  SpO2: 97% 92%    Last Pain:  Vitals:   10/16/21 0950  TempSrc: Oral  PainSc: 0-No pain                 Allena Pietila S

## 2021-10-16 NOTE — H&P (Signed)
Allison Frazier is an 61 y.o. female.[id number]    RP: [place] Myosure Excision/D+C for Polyps   HPI: No change x last visit, no current vaginal bleeding, no pelvic pain.  PMB x 5 days in 05/2021.  Mild dark vaginal bleeding. No pelvic pain.  Postmenopause on HRT with Estradiol 0.5 mg PO daily and Prometrium 100 mg PO HS.  Controlling hot flushes and night sweats.  Pelvic US 07/04/21 showed a thickened endometrial line at 7.6 mm with a probable Endometrial Polyp. Sonohysto confirmed an IU lesion c/w an endometrial polyp and an endocervical polyp. The Endometrial lining was otherwise thin.  H/O Benign Endocervical Polyp removed with Annual Gyn exam 01/07/21.  Pertinent Gynecological History: Menses: post-menopausal Contraception: post menopausal status Blood transfusions: none Sexually transmitted diseases: no past history Previous GYN Procedures:  Tubal Ligation and Endometrial Ablation   Last mammogram: normal  Last pap: normal  OB History: G3, P3   Menstrual History:  Patient's last menstrual period was 12/29/2012.    Past Medical History:  Diagnosis Date   Complication of anesthesia 1996   epidural worked stronger on left side   High cholesterol    History of kidney stones    Left facial numbness 12/13/2017   worked up at [facility] no cause found   PMB (postmenopausal bleeding)    Syncope 11/11/2017   worked up at [place] respiratory infection    finished amoxicillian 09-22-2021, finished steroid dose pack 10-01-2021    Past Surgical History:  Procedure Laterality Date   CESAREAN SECTION  02/10/1994   CESAREAN SECTION  02/11/1996   CHOLECYSTECTOMY  02/03/2020   ENDOMETRIAL ABLATION  04/04/2007   HER OPTION   LASIX     REFRACTIVE SURGERY-  RIGHT EYE   TUBAL LIGATION  02/11/1996    Family History  Problem Relation Age of Onset   Heart disease Father    Cancer Father        Prostate    Social History:  reports that she has never smoked. She has never used  smokeless tobacco. She reports current alcohol use. She reports that she does not use drugs.  Allergies:  Allergies  Allergen Reactions   Other Swelling    Horses Horses   Simvastatin Other (See Comments)    BODY ACHES    Medications Prior to Admission  Medication Sig Dispense Refill Last Dose   azelastine (ASTELIN) 0.1 % nasal spray Place 1 to 2 sprays in each nostril twice a day as needed for drainage down the throat 30 mL 2 10/15/2021   Carbinoxamine Maleate 4 MG TABS Take 1 tablet (4 mg total) by mouth every 8 (eight) hours as needed. 90 tablet 5 10/15/2021   Cyanocobalamin (VITAMIN B 12 PO) Take by mouth.   10/15/2021   doxylamine, Sleep, (UNISOM) 25 MG tablet Take 25 mg by mouth at bedtime.   10/15/2021   montelukast (SINGULAIR) 10 MG tablet TAKE 1 TABLET BY MOUTH AT BEDTIME . APPOINTMENT REQUIRED FOR FUTURE REFILLS 30 tablet 0 10/15/2021   omeprazole (PRILOSEC) 20 MG capsule Take 1 capsule (20 mg total) by mouth daily. 30 capsule 0 10/15/2021   promethazine-dextromethorphan (PROMETHAZINE-DM) 6.25-15 MG/5ML syrup Take 5 mLs by mouth 4 (four) times daily as needed. 100 mL 0 10/15/2021   aspirin 81 MG EC tablet Take by mouth. (Patient not taking: Reported on 10/09/2021)   10/02/2021   estradiol (ESTRACE) 1 MG tablet Take 1 tablet (1 mg total) by mouth daily. (Patient not  taking: Reported on 10/09/2021) 90 tablet 4 10/02/2021   progesterone (PROMETRIUM) 100 MG capsule Take 1 capsule (100 mg total) by mouth at bedtime. (Patient not taking: Reported on 10/09/2021) 90 capsule 4 10/02/2021   psyllium (METAMUCIL SMOOTH TEXTURE) 28 % packet Take 1 packet by mouth every morning. 2 tablespoons   10/09/2021   XHANCE 93 MCG/ACT [place] 2 applicators into the nose 2 (two) times daily. 16 mL 3 10/14/2021    REVIEW OF SYSTEMS: A ROS was performed and pertinent positives and negatives are included in the history.  GENERAL: No fevers or chills. HEENT: No change in vision, no earache, sore throat or sinus congestion.  NECK: No pain or stiffness. CARDIOVASCULAR: No chest pain or pressure. No palpitations. PULMONARY: No shortness of breath, cough or wheeze. GASTROINTESTINAL: No abdominal pain, nausea, vomiting or diarrhea, melena or bright red blood per rectum. GENITOURINARY: No urinary frequency, urgency, hesitancy or dysuria. MUSCULOSKELETAL: No joint or muscle pain, no back pain, no recent trauma. DERMATOLOGIC: No rash, no itching, no lesions. ENDOCRINE: No polyuria, polydipsia, no heat or cold intolerance. No recent change in weight. HEMATOLOGICAL: No anemia or easy bruising or bleeding. NEUROLOGIC: No headache, seizures, numbness, tingling or weakness. PSYCHIATRIC: No depression, no loss of interest in normal activity or change in sleep pattern.     Blood pressure (!) 143/75, pulse (!) 18, temperature 98.2 F (36.8 C), temperature source Oral, resp. rate 18, height '5\' 5"'$  (1.651 m), weight 116.3 kg, last menstrual period 12/29/2012, SpO2 97 %.  Physical Exam:   Results for orders placed or performed during the hospital encounter of 10/16/21 (from the past 24 hour(s))  CBC     Status: Abnormal   Collection Time: 10/16/21 10:00 AM  Result Value Ref Range   WBC 5.1 4.0 - 10.5 K/uL   RBC 5.59 (H) 3.87 - 5.11 MIL/uL   Hemoglobin 15.9 (H) 12.0 - 15.0 g/dL   HCT 51.9 (H) 36.0 - 46.0 %   MCV 92.8 80.0 - 100.0 fL   MCH 28.4 26.0 - 34.0 pg   MCHC 30.6 30.0 - 36.0 g/dL   RDW 13.8 11.5 - 15.5 %   Platelets 239 150 - 400 K/uL   nRBC 0.0 0.0 - 0.2 %   Sono Infusion Hysterogram ( procedure note)    The initial transvaginal ultrasound demonstrated the following:   Enlarged retroflexed uterus with several fibroids.  The largest fibroid was measured at 3.38 cm x 2.45 cm.  The overall uterine size was measured at 10.55 x 5.52 x 4.02 cm.  Thickened endometrial lining again seen at 7 mm with a feeder vessel to the cavity seen posteriorly.  Both ovaries are small with atrophic appearance.  The vaginal scan reveals a 10 x  5 mm mass in the cervical canal with feeder vessel posteriorly.   The speculum  was inserted and the cervix cleansed with Betadine solution after confirming that patient has no allergies.A small sonohysterography catheterwas utilized.  Insertion was facilitated with ring forceps, using a spear-like motion the catheter was inserted to the fundus of the uterus. The speculum is then removed carefully to avoid dislodging the catheter. The catheter was flushed with sterile saline delete prior to insertion to rid it of small amounts of air.the sterile saline solution was infused into the uterine cavity as a vaginal ultrasound probe was then placed in the vagina for full visualization of the uterine cavity from a transvaginal approach. The following was noted:   Sonohysterogram performed to better  outline the endometrial cavity with saline infusion revealing a 11 x 5 mm intracavitary mass.  Otherwise combined wall thickness at 3.4 mm.    The catheter was then removed after retrieving some of the saline from the intrauterine cavity. An endometrial biopsy was not done. Patient tolerated procedure well. She had received a tablet of Aleve for discomfort.         Assessment/Plan:  61 y.o. G3P3003    1. Endometrial polyp  PMB x 5 days in 05/2021.  Mild dark vaginal bleeding.  No pelvic pain.  Postmenopause on HRT with Estradiol 0.5 mg PO daily and Prometrium 100 mg PO HS.  Controlling hot flushes and night sweats.  Pelvic US 07/04/21 showed a thickened endometrial line at 7.6 mm. Sonohysto confirmed an IU lesion c/w an endometrial polyp and an endocervical polyp. The Endometrial lining was otherwise thin.  H/O Benign Endocervical Polyp removed with Annual Gyn exam 01/07/21. Decision to proceed with a [place] Myosure Excision of Polyps/D+C.  [place] pamphlet given.  Preop preparation, surgery and risks, including the risk of uterine perforation and postop precautions and expectations reviewed thoroughly.                          Patient was counseled as to the risk of surgery to include the following:   1. Infection (prohylactic antibiotics will be administered)   2. DVT/Pulmonary Embolism (prophylactic pneumo compression stockings will be used)   3.Trauma to internal organs requiring additional surgical procedure to repair any injury to internal organs requiring perhaps additional hospitalization days.   4.Hemmorhage requiring transfusion and blood products which carry risks such as anaphylactic reaction, hepatitis and AIDS   Patient had received literature information on the procedure scheduled and all her questions were answered and fully accepts all risk.     [place] Alaija Ruble 10/16/2021, 10:53 AM

## 2021-10-17 ENCOUNTER — Encounter (HOSPITAL_BASED_OUTPATIENT_CLINIC_OR_DEPARTMENT_OTHER): Payer: Self-pay | Admitting: Obstetrics & Gynecology

## 2021-10-17 LAB — SURGICAL PATHOLOGY

## 2021-10-20 ENCOUNTER — Other Ambulatory Visit: Payer: Self-pay | Admitting: Allergy & Immunology

## 2021-10-28 ENCOUNTER — Ambulatory Visit (INDEPENDENT_AMBULATORY_CARE_PROVIDER_SITE_OTHER): Payer: BLUE CROSS/BLUE SHIELD | Admitting: Obstetrics & Gynecology

## 2021-10-28 ENCOUNTER — Encounter: Payer: Self-pay | Admitting: Obstetrics & Gynecology

## 2021-10-28 VITALS — BP 128/78 | HR 82 | Resp 20

## 2021-10-28 DIAGNOSIS — Z09 Encounter for follow-up examination after completed treatment for conditions other than malignant neoplasm: Secondary | ICD-10-CM | POA: Diagnosis not present

## 2021-10-28 DIAGNOSIS — R3915 Urgency of urination: Secondary | ICD-10-CM

## 2021-10-28 DIAGNOSIS — R350 Frequency of micturition: Secondary | ICD-10-CM

## 2021-10-28 MED ORDER — SULFAMETHOXAZOLE-TRIMETHOPRIM 800-160 MG PO TABS: 1.0000 | ORAL_TABLET | Freq: Two times a day (BID) | ORAL | 0 refills | Status: DC

## 2021-10-28 NOTE — Progress Notes (Signed)
    Allison Frazier 1960/03/05 [id number]        61 y.o.  G3P3L3   RP: Postop visit HSC/Myosure/D+C on 10/16/21  HPI: Very good postop healing with no vaginal bleeding currently, no vaginal d/c and no pelvic pain.  No fever.  C/O urinary frequency and urgency x yesterday.  Took Azo last night.     OB History  Gravida Para Term Preterm AB Living  '3 3 3     3  '$ SAB IAB Ectopic Multiple Live Births          3    # Outcome Date GA Lbr Len/2nd Weight Sex Delivery Anes PTL Lv  3 Term     F CS-Unspec  N LIV  2 Term     M CS-Unspec  N LIV  1 Term     F Vag-Spont  N LIV    Past medical history,surgical history, problem list, medications, allergies, family history and social history were all reviewed and documented in the EPIC chart.   Directed ROS with pertinent positives and negatives documented in the history of present illness/assessment and plan.  Exam:  Vitals:   10/28/21 0919  BP: 128/78  Pulse: 82  Resp: 20  SpO2: 94%   General appearance:  Normal  CVAT Neg bilaterally  Abdomen: Normal  Gynecologic exam: Vulva normal.  Bimanual exam:  Uterus AV, normal volume, mobile, NT.  No adnexal mass, NT.  No abnormal discharge.  No blood.  Patho 10/16/21: FINAL MICROSCOPIC DIAGNOSIS:   A. ENDOMETRIUM POLYP AND ENDOCERVIX POLYP, CURETTAGE:  -  Benign polyps with endocervical and endometrial features in the  background of disordered proliferative endometrium, negative for  atypia/hyperplasia.    Assessment/Plan:  61 y.o. G3P3L3   1. Status post gynecological surgery, follow-up exam Very good postop healing with no vaginal bleeding currently, no vaginal d/c and no pelvic pain.  No fever.  Normal gyn exam today.  Patho benign, patient informed and reassured.  F/U at [place] visit.  2. Urinary frequency C/O urinary frequency and urgency x yesterday.  Took Azo last night.  Probable acute cystitis.  U. Culture pending.  Decision to treat with Bactrim DS.  No CI.  Usage reviewed  and prescription sent to pharmacy. - Urine Culture  3. Urinary urgency - Urine Culture  Other orders - Liraglutide -Weight Management 18 MG/3ML SOPN; Inject into the skin. - sulfamethoxazole-trimethoprim (BACTRIM DS) 800-160 MG tablet; Take 1 tablet by mouth 2 (two) times daily.   Princess Bruins MD, 10:06 AM 10/28/2021

## 2021-10-29 LAB — URINE CULTURE
MICRO NUMBER:: 13930954
Result:: NO GROWTH
SPECIMEN QUALITY:: ADEQUATE

## 2021-11-05 ENCOUNTER — Telehealth: Payer: Self-pay | Admitting: *Deleted

## 2021-11-05 NOTE — Telephone Encounter (Signed)
Patient called c/o increased hot flashes since stopping Estradiol 0.5 mg PO daily and Prometrium 100 mg PO HS. Post HSC/Myosure/D+C on 10/16/21, she has supply of HRT at home and asked if okay to start back on HRT due to symptoms?   Please advise

## 2021-11-06 NOTE — Telephone Encounter (Signed)
Left detailed message on patient voicemail with the below.

## 2022-01-09 ENCOUNTER — Ambulatory Visit: Payer: Commercial Managed Care - PPO | Admitting: Obstetrics & Gynecology

## 2022-01-27 ENCOUNTER — Other Ambulatory Visit: Payer: Self-pay | Admitting: Obstetrics & Gynecology

## 2022-01-27 NOTE — Telephone Encounter (Signed)
Med refill request: progesterone 100 mg caps Last AEX: 12/12/20 /ML Next AEX: 02/11/22 / ML Last MMG (if hormonal med) 04/17/21, Neg, [id number]  Rx pended for #30/0RF Refill authorized: Please Advise?

## 2022-02-11 ENCOUNTER — Ambulatory Visit (INDEPENDENT_AMBULATORY_CARE_PROVIDER_SITE_OTHER): Payer: BLUE CROSS/BLUE SHIELD

## 2022-02-11 ENCOUNTER — Ambulatory Visit
Admission: EM | Admit: 2022-02-11 | Discharge: 2022-02-11 | Disposition: A | Discharge status: Home / Self Care | Payer: BLUE CROSS/BLUE SHIELD | Attending: Urgent Care | Admitting: Urgent Care

## 2022-02-11 ENCOUNTER — Encounter: Payer: Self-pay | Admitting: Emergency Medicine

## 2022-02-11 ENCOUNTER — Ambulatory Visit: Payer: BLUE CROSS/BLUE SHIELD | Admitting: Obstetrics & Gynecology

## 2022-02-11 DIAGNOSIS — B349 Viral infection, unspecified: Secondary | ICD-10-CM | POA: Insufficient documentation

## 2022-02-11 DIAGNOSIS — R059 Cough, unspecified: Secondary | ICD-10-CM

## 2022-02-11 DIAGNOSIS — R062 Wheezing: Secondary | ICD-10-CM | POA: Diagnosis not present

## 2022-02-11 DIAGNOSIS — R509 Fever, unspecified: Secondary | ICD-10-CM

## 2022-02-11 DIAGNOSIS — J4530 Mild persistent asthma, uncomplicated: Secondary | ICD-10-CM | POA: Diagnosis not present

## 2022-02-11 DIAGNOSIS — Z1152 Encounter for screening for COVID-19: Secondary | ICD-10-CM | POA: Insufficient documentation

## 2022-02-11 MED ORDER — LEVOCETIRIZINE DIHYDROCHLORIDE 5 MG PO TABS
5.0000 mg | ORAL_TABLET | Freq: Every evening | ORAL | 0 refills | Status: DC
Start: 2022-02-11 — End: 2022-04-11

## 2022-02-11 MED ORDER — PROMETHAZINE-DM 6.25-15 MG/5ML PO SYRP: 5.0000 mL | ORAL_SOLUTION | Freq: Three times a day (TID) | ORAL | 0 refills | Status: DC | PRN

## 2022-02-11 MED ORDER — PREDNISONE 50 MG PO TABS: 50.0000 mg | ORAL_TABLET | Freq: Every day | ORAL | 0 refills | Status: DC

## 2022-02-11 MED ORDER — ALBUTEROL SULFATE HFA 108 (90 BASE) MCG/ACT IN AERS: 1.0000 | INHALATION_SPRAY | Freq: Four times a day (QID) | RESPIRATORY_TRACT | 0 refills | Status: DC | PRN

## 2022-02-11 NOTE — Discharge Instructions (Signed)
We will notify you of your test results as they arrive and may take between about 24 hours.  I encourage you to sign up for MyChart if you have not already done so as this can be the easiest way for [place] to communicate results to you online or through a phone app.  Generally, we only contact you if it is a positive test result.  In the meantime, if you develop worsening symptoms including fever, chest pain, shortness of breath despite our current treatment plan then please report to the emergency room as this may be a sign of worsening status from possible viral infection.  Otherwise, we will manage this as a viral syndrome. For sore throat or cough try using a honey-based tea. Use 3 teaspoons of honey with juice squeezed from half lemon. Place shaved pieces of ginger into 1/2-1 cup of water and warm over stove top. Then mix the ingredients and repeat every 4 hours as needed. Please take Tylenol '500mg'$ -'650mg'$  every 6 hours for aches and pains, fevers. Hydrate very well with at least 2 liters of water. Eat light meals such as soups to replenish electrolytes and soft fruits, veggies. Start an antihistamine like Xyzal for postnasal drainage, sinus congestion.  You can take this together with prednisone and albuterol.  Use the cough medications as needed.

## 2022-02-11 NOTE — ED Provider Notes (Signed)
[facility]  Note:  This document was prepared using Systems analyst and may include unintentional dictation errors.  MRN: [id number] DOB: [birth year]  Subjective:   Allison Frazier is a 62 y.o. female presenting for 3-day history of acute onset persistent productive cough, sinus drainage, chest congestion, chest discomfort.  Patient did a COVID test at home and was negative.  Has tried multiple over-the-counter medications.  Has a history of reactive airway disease and would like a refill on her albuterol.  She is not opposed to COVID test.  No current facility-administered medications for this encounter.  Current Outpatient Medications:    aspirin 81 MG EC tablet, Take by mouth., Disp: , Rfl:    azelastine (ASTELIN) 0.1 % nasal spray, Place 1 to 2 sprays in each nostril twice a day as needed for drainage down the throat, Disp: 30 mL, Rfl: 2   Carbinoxamine Maleate 4 MG TABS, Take 1 tablet (4 mg total) by mouth every 8 (eight) hours as needed., Disp: 90 tablet, Rfl: 5   Cyanocobalamin (VITAMIN B 12 PO), Take by mouth., Disp: , Rfl:    doxylamine, Sleep, (UNISOM) 25 MG tablet, Take 25 mg by mouth at bedtime., Disp: , Rfl:    montelukast (SINGULAIR) 10 MG tablet, TAKE 1 TABLET BY MOUTH AT BEDTIME . APPOINTMENT REQUIRED FOR FUTURE REFILLS, Disp: 30 tablet, Rfl: 5   progesterone (PROMETRIUM) 100 MG capsule, Take 1 capsule by mouth at bedtime, Disp: 30 capsule, Rfl: 0   psyllium (METAMUCIL SMOOTH TEXTURE) 28 % packet, Take 1 packet by mouth every morning. 2 tablespoons, Disp: , Rfl:    Liraglutide -Weight Management 18 MG/3ML SOPN, Inject into the skin., Disp: , Rfl:    omeprazole (PRILOSEC) 20 MG capsule, Take 1 capsule (20 mg total) by mouth daily., Disp: 30 capsule, Rfl: 0   sulfamethoxazole-trimethoprim (BACTRIM DS) 800-160 MG tablet, Take 1 tablet by mouth 2 (two) times daily., Disp: 6 tablet, Rfl: 0   XHANCE 93 MCG/ACT EXHU, Place 2 applicators  into the nose 2 (two) times daily., Disp: 16 mL, Rfl: 3   Allergies  Allergen Reactions   Other Swelling    Horses Horses   Simvastatin Other (See Comments)    BODY ACHES    Past Medical History:  Diagnosis Date   Complication of anesthesia 1996   epidural worked stronger on left side   High cholesterol    History of kidney stones    Left facial numbness 12/13/2017   worked up at [facility] no cause found   PMB (postmenopausal bleeding)    Syncope 11/11/2017   worked up at [place] respiratory infection    finished [place] 09-22-2021, finished steroid dose pack 10-01-2021     Past Surgical History:  Procedure Laterality Date   CESAREAN SECTION  02/10/1994   CESAREAN SECTION  02/11/1996   CHOLECYSTECTOMY  02/03/2020   DILATATION & CURETTAGE/HYSTEROSCOPY WITH MYOSURE N/A 10/16/2021   Procedure: [place];  Surgeon: Princess Bruins, MD;  Location: [facility];  Service: Gynecology;  Laterality: N/A;   ENDOMETRIAL ABLATION  04/04/2007   HER OPTION   LASIX     REFRACTIVE SURGERY-  RIGHT EYE   TUBAL LIGATION  02/11/1996    Family History  Problem Relation Age of Onset   Heart disease Father    Cancer Father        Prostate    Social History   Tobacco Use  Smoking status: Never   Smokeless tobacco: Never  Vaping Use   Vaping Use: Never used  Substance Use Topics   Alcohol use: Yes    Comment: social   Drug use: Never    ROS   Objective:   Vitals: BP 129/77 (BP Location: Right Arm)   Pulse 79   Temp 99.3 F (37.4 C) (Oral)   Resp 18   Ht '5\' 5"'$  (1.651 m)   Wt 258 lb (117 kg)   LMP 12/29/2012 Comment: BTL  SpO2 96%   BMI 42.93 kg/m   Physical Exam Constitutional:      General: She is not in acute distress.    Appearance: Normal appearance. She is well-developed and normal weight. She is not ill-appearing, toxic-appearing or diaphoretic.  HENT:     Head: Normocephalic and  atraumatic.     Right Ear: Tympanic membrane, ear canal and external ear normal. No drainage or tenderness. No middle ear effusion. There is no impacted cerumen. Tympanic membrane is not erythematous or bulging.     Left Ear: Tympanic membrane, ear canal and external ear normal. No drainage or tenderness.  No middle ear effusion. There is no impacted cerumen. Tympanic membrane is not erythematous or bulging.     Nose: Congestion present. No rhinorrhea.     Mouth/Throat:     Mouth: Mucous membranes are moist. No oral lesions.     Pharynx: No pharyngeal swelling, oropharyngeal exudate, posterior oropharyngeal erythema or uvula swelling.     Tonsils: No tonsillar exudate or tonsillar abscesses.  Eyes:     General: No scleral icterus.       Right eye: No discharge.        Left eye: No discharge.     Extraocular Movements: Extraocular movements intact.     Right eye: Normal extraocular motion.     Left eye: Normal extraocular motion.     Conjunctiva/sclera: Conjunctivae normal.  Cardiovascular:     Rate and Rhythm: Normal rate and regular rhythm.     Heart sounds: Normal heart sounds. No murmur heard.    No friction rub. No gallop.  Pulmonary:     Effort: Pulmonary effort is normal. No respiratory distress.     Breath sounds: No stridor. No wheezing, rhonchi or rales.  Chest:     Chest wall: No tenderness.  Musculoskeletal:     Cervical back: Normal range of motion and neck supple.  Lymphadenopathy:     Cervical: No cervical adenopathy.  Skin:    General: Skin is warm and dry.  Neurological:     General: No focal deficit present.     Mental Status: She is alert and oriented to person, place, and time.  Psychiatric:        Mood and Affect: Mood normal.        Behavior: Behavior normal.    DG Chest 2 View  Result Date: 02/11/2022 CLINICAL DATA:  62 year old female presenting with wheezing chest congestion and cough with fever for 3 days. EXAM: CHEST - 2 VIEW COMPARISON:  October 09, 2021. FINDINGS: The heart size and mediastinal contours are within normal limits. Both lungs are clear. The visualized skeletal structures are unremarkable. IMPRESSION: No active cardiopulmonary disease. Electronically Signed   By: Zetta Bills M.D.   On: 02/11/2022 10:37    Assessment and Plan :   PDMP not reviewed this encounter.  1. Acute viral syndrome   2. Mild persistent reactive airway disease without complication  Chest x-ray negative. X-ray over-read was pending at time of discharge, recommended follow up with only abnormal results. Otherwise will not call for negative over-read. Patient was in agreement.   Will manage for viral illness such as viral URI, viral syndrome, viral rhinitis, COVID-19.  In the context of her reactive airway disease, recommended a oral prednisone course.  Refilled her albuterol inhaler.  Otherwise, recommended supportive care. Offered scripts for symptomatic relief. Testing is pending. Counseled patient on potential for adverse effects with medications prescribed/recommended today, ER and return-to-clinic precautions discussed, patient verbalized understanding.   Patient should undergo Paxlovid treatment should she test positive for COVID-19.   Jaynee Eagles, [place] 02/11/22 1215

## 2022-02-11 NOTE — ED Triage Notes (Signed)
Patient c/o productive cough, nasal drainage, chest congestion, low grade fever x 3 days.  Patient has taken [place] Flu, [place], Delsym.

## 2022-02-12 LAB — SARS CORONAVIRUS 2 (TAT 6-24 HRS): SARS Coronavirus 2: NEGATIVE

## 2022-02-23 ENCOUNTER — Other Ambulatory Visit: Payer: Self-pay | Admitting: Obstetrics & Gynecology

## 2022-02-25 NOTE — Telephone Encounter (Signed)
Med refill request:progesterone 100 mg caps Last AEX: 01/07/21/ ML Next AEX:04/14/22/ ML (patient was scheduled 02/11/22, pt r/s due to sickness) Last MMG (if hormonal med) 04/17/21, Birads 1 neg  Rx pended #30/1 RF  Refill authorized: Please Advise?

## 2022-04-11 ENCOUNTER — Ambulatory Visit: Payer: BLUE CROSS/BLUE SHIELD | Admitting: Allergy & Immunology

## 2022-04-11 ENCOUNTER — Encounter: Payer: Self-pay | Admitting: Allergy & Immunology

## 2022-04-11 ENCOUNTER — Other Ambulatory Visit: Payer: Self-pay

## 2022-04-11 VITALS — BP 120/58 | HR 95 | Temp 98.8°F | Resp 18 | Ht 64.57 in | Wt 262.6 lb

## 2022-04-11 DIAGNOSIS — J4520 Mild intermittent asthma, uncomplicated: Secondary | ICD-10-CM

## 2022-04-11 DIAGNOSIS — J3089 Other allergic rhinitis: Secondary | ICD-10-CM | POA: Diagnosis not present

## 2022-04-11 DIAGNOSIS — J302 Other seasonal allergic rhinitis: Secondary | ICD-10-CM

## 2022-04-11 MED ORDER — FLUTICASONE FUROATE-VILANTEROL 200-25 MCG/ACT IN AEPB: 1.0000 | INHALATION_SPRAY | Freq: Every day | RESPIRATORY_TRACT | 5 refills | Status: DC

## 2022-04-11 MED ORDER — AIRSUPRA 90-80 MCG/ACT IN AERO: 2.0000 | INHALATION_SPRAY | RESPIRATORY_TRACT | 5 refills | Status: DC | PRN

## 2022-04-11 NOTE — Patient Instructions (Addendum)
1. Seasonal and perennial allergic rhinitis (grasses, ragweed, trees, outdoor molds, and cockroach) - Information on allergy shots provided. - CPT codes provided.  - Continue with montelukast '10mg'$  daily. - Restart the azelastine two sprays per nostril up to twice daily. - Restart an over-the-counter antihistamine such as Xyzal 5 mg up to twice daily. - Call us when you make a decision on allergy shots.  2. Mild persistent asthma, uncomplicated - You were wheezing on exam and the lung testing was a little bit low. - We are not going to start a daily controller medication to see if this can help with your pulmonary symptoms. - Breo once daily and contains a long-acting albuterol combined with a long-acting steroid. - Demonstration of the device provided. - We are also starting a new albuterol called AirSupra (contains albuterol plus an inhaled steroid). - I sent this in with a co-pay card attached. - Sample of AirSupra provided today. - Daily controller medication(s): Breo 100/56mg one puff once daily - Prior to physical activity: AirSupra two puffs 10-15 minutes before physical activity. - Rescue medications: AirSupra 2 puffs every 4-6 hours as needed - Asthma control goals:  * Full participation in all desired activities (may need albuterol before activity) * Albuterol use two time or less a week on average (not counting use with activity) * Cough interfering with sleep two time or less a month * Oral steroids no more than once a year * No hospitalizations  3. Return in about 3 months (around 07/12/2022).   Thanks for taking care of all of those babies!    Please inform [facility] any Emergency Department visits, hospitalizations, or changes in symptoms. Call [facility] going to the ED for breathing or allergy symptoms since we might be able to fit you in for a sick visit. Feel free to contact [facility] with any questions, problems, or concerns.  It was a pleasure to see you again  today!  Websites that have reliable patient information: 1. American Academy of Asthma, Allergy, and Immunology: www.aaaai.org 2. Food Allergy Research and Education (FARE): foodallergy.org 3. Mothers of Asthmatics: http://www.asthmacommunitynetwork.org 4. American College of Allergy, Asthma, and Immunology: www.acaai.org   COVID-19 Vaccine Information can be found at: [email or web address] questions related to vaccine distribution or appointments, please email [email or web address] or call [phone]   We realize that you might be concerned about having an allergic reaction to the COVID19 vaccines. To help with that concern, WE ARE OFFERING THE COVID19 VACCINES IN OUR OFFICE! Ask the front desk for dates!     "Like" [facility] Facebook and Instagram for our latest updates!      A healthy democracy works best when ANew [employer] Life Insuranceparticipate! Make sure you are registered to vote! If you have moved or changed any of your contact information, you will need to get this updated before voting!  In some cases, you MAY be able to register to vote online: [email or web address]     Allergy Shots  Allergies are the result of a chain reaction that starts in the immune system. Your immune system controls how your body defends itself. For instance, if you have an allergy to pollen, your immune system identifies pollen as an invader or allergen. Your immune system overreacts by producing antibodies called Immunoglobulin E (IgE). These antibodies travel to cells that release chemicals, causing an allergic reaction.  The concept behind allergy immunotherapy, whether it is received in the form of shots or tablets, is that the immune system can  be desensitized to specific allergens that trigger allergy symptoms. Although it requires time and patience, the payback can be long-term relief. Allergy injections contain a dilute  solution of those substances that you are allergic to based upon your skin testing and allergy history.   How Do Allergy Shots Work?  Allergy shots work much like a vaccine. Your body responds to injected amounts of a particular allergen given in increasing doses, eventually developing a resistance and tolerance to it. Allergy shots can lead to decreased, minimal or no allergy symptoms.  There generally are two phases: build-up and maintenance. Build-up often ranges from three to six months and involves receiving injections with increasing amounts of the allergens. The shots are typically given once or twice a week, though more rapid build-up schedules are sometimes used.  The maintenance phase begins when the most effective dose is reached. This dose is different for each person, depending on how allergic you are and your response to the build-up injections. Once the maintenance dose is reached, there are longer periods between injections, typically two to four weeks.  Occasionally doctors give cortisone-type shots that can temporarily reduce allergy symptoms. These types of shots are different and should not be confused with allergy immunotherapy shots.  Who Can Be Treated with Allergy Shots?  Allergy shots may be a good treatment approach for people with allergic rhinitis (hay fever), allergic asthma, conjunctivitis (eye allergy) or stinging insect allergy.   Before deciding to begin allergy shots, you should consider:   The length of allergy season and the severity of your symptoms  Whether medications and/or changes to your environment can control your symptoms  Your desire to avoid long-term medication use  Time: allergy immunotherapy requires a major time commitment  Cost: may vary depending on your insurance coverage  Allergy shots for children age 31 and older are effective and often well tolerated. They might prevent the onset of new allergen sensitivities or the progression to  asthma.  Allergy shots are not started on patients who are pregnant but can be continued on patients who become pregnant while receiving them. In some patients with other medical conditions or who take certain common medications, allergy shots may be of risk. It is important to mention other medications you talk to your allergist.   What are the two types of build-ups offered:   RUSH or Rapid Desensitization -- one day of injections lasting from 8:30-4:30pm, injections every 1 hour.  Approximately half of the build-up process is completed in that one day.  The following week, normal build-up is resumed, and this entails ~16 visits either weekly or twice weekly, until reaching your "maintenance dose" which is continued weekly until eventually getting spaced out to every month for a duration of 3 to 5 years. The regular build-up appointments are nurse visits where the injections are administered, followed by required monitoring for 30 minutes.    Traditional build-up -- weekly visits for 6 -12 months until reaching "maintenance dose", then continue weekly until eventually spacing out to every 4 weeks as above. At these appointments, the injections are administered, followed by required monitoring for 30 minutes.     Either way is acceptable, and both are equally effective. With the rush protocol, the advantage is that less time is spent here for injections overall AND you would also reach maintenance dosing faster (which is when the clinical benefit starts to become more apparent). Not everyone is a candidate for rapid desensitization.   IF we proceed with  the RUSH protocol, there are premedications which must be taken the day before and the day after the rush only (this includes antihistamines, steroids, and Singulair).  After the rush day, no prednisone or Singulair is required, and we just recommend antihistamines taken on your injection day.  What Is An Estimate of the Costs?  If you are  interested in starting allergy injections, please check with your insurance company about your coverage for both allergy vial sets and allergy injections.  Please do so prior to making the appointment to start injections.  The following are CPT codes to give to your insurance company. These are the amounts we BILL to the insurance company, but the amount YOU WILL PAY and [place] and depends on the contracts we have with different insurance companies.   Amount Billed to Insurance One allergy vial set  CPT 95165   $ 1200     Two allergy vial set  CPT 95165   $ 2400     Three allergy vial set  CPT 95165   $ 3600     One injection   CPT 95115   $ 35  Two injections   CPT 95117   $ 40 RUSH (Rapid Desensitization) CPT 95180 x 8 hours $500/hour  Regarding the allergy injections, your co-pay may or may not apply with each injection, so please confirm this with your insurance company. When you start allergy injections, 1 or 2 sets of vials are made based on your allergies.  Not all patients can be on one set of vials. A set of vials lasts 6 months to a year depending on how quickly you can proceed with your build-up of your allergy injections. Vials are personalized for each patient depending on their specific allergens.  How often are allergy injection given during the build-up period?   Injections are given at least weekly during the build-up period until your maintenance dose is achieved. Per the doctor's discretion, you may have the option of getting allergy injections two times per week during the build-up period. However, there must be at least 48 hours between injections. The build-up period is usually completed within 6-12 months depending on your ability to schedule injections and for adjustments for reactions. When maintenance dose is reached, your injection schedule is gradually changed to every two weeks and later to every three weeks. Injections will then continue every 4  weeks. Usually, injections are continued for a total of 3-5 years.   When Will I Feel Better?  Some may experience decreased allergy symptoms during the build-up phase. For others, it may take as long as 12 months on the maintenance dose. If there is no improvement after a year of maintenance, your allergist will discuss other treatment options with you.  If you aren't responding to allergy shots, it may be because there is not enough dose of the allergen in your vaccine or there are missing allergens that were not identified during your allergy testing. Other reasons could be that there are high levels of the allergen in your environment or major exposure to non-allergic triggers like tobacco smoke.  What Is the Length of Treatment?  Once the maintenance dose is reached, allergy shots are generally continued for three to five years. The decision to stop should be discussed with your allergist at that time. Some people may experience a permanent reduction of allergy symptoms. Others may relapse and a longer course of allergy shots can be considered.  What Are  the Possible Reactions?  The two types of adverse reactions that can occur with allergy shots are local and systemic. Common local reactions include very mild redness and swelling at the injection site, which can happen immediately or several hours after. Report a delayed reaction from your last injection. These include arm swelling or runny nose, watery eyes or cough that occurs within 12-24 hours after injection. A systemic reaction, which is less common, affects the entire body or a particular body system. They are usually mild and typically respond quickly to medications. Signs include increased allergy symptoms such as sneezing, a stuffy nose or hives.   Rarely, a serious systemic reaction called anaphylaxis can develop. Symptoms include swelling in the throat, wheezing, a feeling of tightness in the chest, nausea or dizziness. Most serious  systemic reactions develop within 30 minutes of allergy shots. This is why it is strongly recommended you wait in your doctor's office for 30 minutes after your injections. Your allergist is trained to watch for reactions, and his or her staff is trained and equipped with the proper medications to identify and treat them.   Report to the nurse immediately if you experience any of the following symptoms: swelling, itching or redness of the skin, hives, watery eyes/nose, breathing difficulty, excessive sneezing, coughing, stomach pain, diarrhea, or light headedness. These symptoms may occur within 15-20 minutes after injection and may require medication.   Who Should Administer Allergy Shots?  The preferred location for receiving shots is your prescribing allergist's office. Injections can sometimes be given at another facility where the physician and staff are trained to recognize and treat reactions, and have received instructions by your prescribing allergist.  What if I am late for an injection?   Injection dose will be adjusted depending upon how many days or weeks you are late for your injection.   What if I am sick?   Please report any illness to the nurse before receiving injections. She may adjust your dose or postpone injections depending on your symptoms. If you have fever, flu, sinus infection or chest congestion it is best to postpone allergy injections until you are better. Never get an allergy injection if your asthma is causing you problems. If your symptoms persist, seek out medical care to get your health problem under control.  What If I am or Become Pregnant:  Women that become pregnant should schedule an appointment with [facility] before receiving any further allergy injections.

## 2022-04-11 NOTE — Progress Notes (Unsigned)
FOLLOW UP  Date of Service/Encounter:  04/11/22   Assessment:   Seasonal and perennial allergic rhinitis (grasses, ragweed, trees, outdoor molds, and cockroach) - doing well on the current regimen   Viral URI - recommended pseudoephedrine and Mucinex   Mild intermittent asthma, uncomplicated    Plan/Recommendations:    Patient Instructions  1. Seasonal and perennial allergic rhinitis (grasses, ragweed, trees, outdoor molds, and cockroach) - Information on allergy shots provided. - CPT codes provided.  - Continue with montelukast '10mg'$  daily. - Restart the azelastine two sprays per nostril up to twice daily. - Restart an over-the-counter antihistamine such as Xyzal 5 mg up to twice daily. - Call us when you make a decision on allergy shots.  2. Mild persistent asthma, uncomplicated - You were wheezing on exam and the lung testing was a little bit low. - We are not going to start a daily controller medication to see if this can help with your pulmonary symptoms. - Breo once daily and contains a long-acting albuterol combined with a long-acting steroid. - Demonstration of the device provided. - We are also starting a new albuterol called AirSupra (contains albuterol plus an inhaled steroid). - I sent this in with a co-pay card attached. - Sample of AirSupra provided today. - Daily controller medication(s): Breo 100/56mg one puff once daily - Prior to physical activity: AirSupra two puffs 10-15 minutes before physical activity. - Rescue medications: AirSupra 2 puffs every 4-6 hours as needed - Asthma control goals:  * Full participation in all desired activities (may need albuterol before activity) * Albuterol use two time or less a week on average (not counting use with activity) * Cough interfering with sleep two time or less a month * Oral steroids no more than once a year * No hospitalizations  3. Return in about 3 months (around 07/12/2022).   Thanks for taking care  of all of those babies!    Please inform [place] any Emergency Department visits, hospitalizations, or changes in symptoms. Call [place] going to the ED for breathing or allergy symptoms since we might be able to fit you in for a sick visit. Feel free to contact u[place]anytime with any questions, problems, or concerns.  It was a pleasure to see you again today!  Websites that have reliable patient information: 1. American Academy of Asthma, Allergy, and Immunology: www.aaaai.org 2. Food Allergy Research and Education (FARE): foodallergy.org 3. Mothers of Asthmatics: http://www.asthmacommunitynetwork.org 4. American College of Allergy, Asthma, and Immunology: www.acaai.org   COVID-19 Vaccine Information can be found at: [email or web address] questions related to vaccine distribution or appointments, please email [email or web address] or call [phone]   We realize that you might be concerned about having an allergic reaction to the COVID19 vaccines. To help with that concern, WE ARE OFFERING THE COVID19 VACCINES IN OUR OFFICE! Ask the front desk for dates!     "Like" [facility] Facebook and Instagram for our latest updates!      A healthy democracy works best when ANew [employer] Life Insuranceparticipate! Make sure you are registered to vote! If you have moved or changed any of your contact information, you will need to get this updated before voting!  In some cases, you MAY be able to register to vote online: [email or web address]     Allergy Shots  Allergies are the result of a chain reaction that starts in the immune system. Your immune system controls how your body defends itself. For instance, if you have an allergy  to pollen, your immune system identifies pollen as an invader or allergen. Your immune system overreacts by producing antibodies called Immunoglobulin E (IgE). These antibodies travel to cells that  release chemicals, causing an allergic reaction.  The concept behind allergy immunotherapy, whether it is received in the form of shots or tablets, is that the immune system can be desensitized to specific allergens that trigger allergy symptoms. Although it requires time and patience, the payback can be long-term relief. Allergy injections contain a dilute solution of those substances that you are allergic to based upon your skin testing and allergy history.   How Do Allergy Shots Work?  Allergy shots work much like a vaccine. Your body responds to injected amounts of a particular allergen given in increasing doses, eventually developing a resistance and tolerance to it. Allergy shots can lead to decreased, minimal or no allergy symptoms.  There generally are two phases: build-up and maintenance. Build-up often ranges from three to six months and involves receiving injections with increasing amounts of the allergens. The shots are typically given once or twice a week, though more rapid build-up schedules are sometimes used.  The maintenance phase begins when the most effective dose is reached. This dose is different for each person, depending on how allergic you are and your response to the build-up injections. Once the maintenance dose is reached, there are longer periods between injections, typically two to four weeks.  Occasionally doctors give cortisone-type shots that can temporarily reduce allergy symptoms. These types of shots are different and should not be confused with allergy immunotherapy shots.  Who Can Be Treated with Allergy Shots?  Allergy shots may be a good treatment approach for people with allergic rhinitis (hay fever), allergic asthma, conjunctivitis (eye allergy) or stinging insect allergy.   Before deciding to begin allergy shots, you should consider:   The length of allergy season and the severity of your symptoms  Whether medications and/or changes to your environment  can control your symptoms  Your desire to avoid long-term medication use  Time: allergy immunotherapy requires a major time commitment  Cost: may vary depending on your insurance coverage  Allergy shots for children age 91 and older are effective and often well tolerated. They might prevent the onset of new allergen sensitivities or the progression to asthma.  Allergy shots are not started on patients who are pregnant but can be continued on patients who become pregnant while receiving them. In some patients with other medical conditions or who take certain common medications, allergy shots may be of risk. It is important to mention other medications you talk to your allergist.   What are the two types of build-ups offered:   RUSH or Rapid Desensitization -- one day of injections lasting from 8:30-4:30pm, injections every 1 hour.  Approximately half of the build-up process is completed in that one day.  The following week, normal build-up is resumed, and this entails ~16 visits either weekly or twice weekly, until reaching your "maintenance dose" which is continued weekly until eventually getting spaced out to every month for a duration of 3 to 5 years. The regular build-up appointments are nurse visits where the injections are administered, followed by required monitoring for 30 minutes.    Traditional build-up -- weekly visits for 6 -12 months until reaching "maintenance dose", then continue weekly until eventually spacing out to every 4 weeks as above. At these appointments, the injections are administered, followed by required monitoring for 30 minutes.  Either way is acceptable, and both are equally effective. With the rush protocol, the advantage is that less time is spent here for injections overall AND you would also reach maintenance dosing faster (which is when the clinical benefit starts to become more apparent). Not everyone is a candidate for rapid desensitization.   IF we proceed  with the RUSH protocol, there are premedications which must be taken the day before and the day after the rush only (this includes antihistamines, steroids, and Singulair).  After the rush day, no prednisone or Singulair is required, and we just recommend antihistamines taken on your injection day.  What Is An Estimate of the Costs?  If you are interested in starting allergy injections, please check with your insurance company about your coverage for both allergy vial sets and allergy injections.  Please do so prior to making the appointment to start injections.  The following are CPT codes to give to your insurance company. These are the amounts we BILL to the insurance company, but the amount YOU WILL PAY and [place] and depends on the contracts we have with different insurance companies.   Amount Billed to Insurance One allergy vial set  CPT 95165   $ 1200     Two allergy vial set  CPT 95165   $ 2400     Three allergy vial set  CPT 95165   $ 3600     One injection   CPT 95115   $ 35  Two injections   CPT 95117   $ 40 RUSH (Rapid Desensitization) CPT 95180 x 8 hours $500/hour  Regarding the allergy injections, your co-pay may or may not apply with each injection, so please confirm this with your insurance company. When you start allergy injections, 1 or 2 sets of vials are made based on your allergies.  Not all patients can be on one set of vials. A set of vials lasts 6 months to a year depending on how quickly you can proceed with your build-up of your allergy injections. Vials are personalized for each patient depending on their specific allergens.  How often are allergy injection given during the build-up period?   Injections are given at least weekly during the build-up period until your maintenance dose is achieved. Per the doctor's discretion, you may have the option of getting allergy injections two times per week during the build-up period. However, there must be  at least 48 hours between injections. The build-up period is usually completed within 6-12 months depending on your ability to schedule injections and for adjustments for reactions. When maintenance dose is reached, your injection schedule is gradually changed to every two weeks and later to every three weeks. Injections will then continue every 4 weeks. Usually, injections are continued for a total of 3-5 years.   When Will I Feel Better?  Some may experience decreased allergy symptoms during the build-up phase. For others, it may take as long as 12 months on the maintenance dose. If there is no improvement after a year of maintenance, your allergist will discuss other treatment options with you.  If you aren't responding to allergy shots, it may be because there is not enough dose of the allergen in your vaccine or there are missing allergens that were not identified during your allergy testing. Other reasons could be that there are high levels of the allergen in your environment or major exposure to non-allergic triggers like tobacco smoke.  What Is the Length  of Treatment?  Once the maintenance dose is reached, allergy shots are generally continued for three to five years. The decision to stop should be discussed with your allergist at that time. Some people may experience a permanent reduction of allergy symptoms. Others may relapse and a longer course of allergy shots can be considered.  What Are the Possible Reactions?  The two types of adverse reactions that can occur with allergy shots are local and systemic. Common local reactions include very mild redness and swelling at the injection site, which can happen immediately or several hours after. Report a delayed reaction from your last injection. These include arm swelling or runny nose, watery eyes or cough that occurs within 12-24 hours after injection. A systemic reaction, which is less common, affects the entire body or a particular body  system. They are usually mild and typically respond quickly to medications. Signs include increased allergy symptoms such as sneezing, a stuffy nose or hives.   Rarely, a serious systemic reaction called anaphylaxis can develop. Symptoms include swelling in the throat, wheezing, a feeling of tightness in the chest, nausea or dizziness. Most serious systemic reactions develop within 30 minutes of allergy shots. This is why it is strongly recommended you wait in your doctor's office for 30 minutes after your injections. Your allergist is trained to watch for reactions, and his or her staff is trained and equipped with the proper medications to identify and treat them.   Report to the nurse immediately if you experience any of the following symptoms: swelling, itching or redness of the skin, hives, watery eyes/nose, breathing difficulty, excessive sneezing, coughing, stomach pain, diarrhea, or light headedness. These symptoms may occur within 15-20 minutes after injection and may require medication.   Who Should Administer Allergy Shots?  The preferred location for receiving shots is your prescribing allergist's office. Injections can sometimes be given at another facility where the physician and staff are trained to recognize and treat reactions, and have received instructions by your prescribing allergist.  What if I am late for an injection?   Injection dose will be adjusted depending upon how many days or weeks you are late for your injection.   What if I am sick?   Please report any illness to the nurse before receiving injections. She may adjust your dose or postpone injections depending on your symptoms. If you have fever, flu, sinus infection or chest congestion it is best to postpone allergy injections until you are better. Never get an allergy injection if your asthma is causing you problems. If your symptoms persist, seek out medical care to get your health problem under control.  What If I  am or Become Pregnant:  Women that become pregnant should schedule an appointment with [facility] [place] before receiving any further allergy injections.        Subjective:   Allison Frazier is a 62 y.o. female presenting today for follow up of  Chief Complaint  Patient presents with   Follow-up    Patient wanted to see about getting shots treatment hasn't been working watery eyes and sneezing     VADIE PEARL has a history of the following: Patient Active Problem List   Diagnosis Date Noted   Acute focal neurological deficit 12/12/2017   Left facial numbness 12/12/2017   Acute focal neurologic deficit with partial resolution 12/12/2017   Near syncope 02/17/2013   Morbid obesity ([place]) 07/11/2012   Overweight(278.02) 06/23/2012   Fibroid uterus 05/05/2011  High cholesterol     History obtained from: chart review and {Persons; PED relatives w/patient:19415::"patient"}.  Geniel is a 62 y.o. female presenting for {Blank single:19197::"a food challenge","a drug challenge","skin testing","a sick visit","an evaluation of ***","a follow up visit"}.  She was last seen in August 2023.  At that time, Althea Charon continued her on RyVent 3-4 times a day as well as Singulair, Xhance, and Astelin as needed.  For her asthma, her spirometry looked good.  She did get a stat chest x-ray to look at her lungs due to her chronic cough.  She was started on omeprazole 20 mg daily to see if this was reflux related.  CXR was normal.  Since the last visit, she has continued ot have issues with postnasal drip and allergies. She has been   She has remained on the montelukast. She was on the [place] but she never noticed a difference with that. She has azelastine to use as needed.   She took the omeprazole but she did not notice a difference. She has noticed some wheezing that picks up when she lays down at night. She did not listen to herself to see where the wheezing at night. She did  not listen to herself to see where the wheezing was coming from. She noticed things recently in the last week or so really. She has albuterol that does not help with her symptoms.   {Blank single:19197::"Asthma/Respiratory Symptom History: ***"," "}  {Blank single:19197::"Allergic Rhinitis Symptom History: ***"," "}  {Blank single:19197::"Food Allergy Symptom History: ***"," "}  {Blank single:19197::"Skin Symptom History: ***"," "}  {Blank single:19197::"GERD Symptom History: ***"," "}  Otherwise, there have been no changes to her past medical history, surgical history, family history, or social history.    ROS     Objective:   Blood pressure (!) 120/58, pulse 95, temperature 98.8 F (37.1 C), resp. rate 18, height 5' 4.57" (1.64 m), weight 262 lb 9.6 oz (119.1 kg), last menstrual period 12/29/2012, SpO2 95 %. Body mass index is 44.29 kg/m.    Physical Exam   Diagnostic studies: {Blank single:19197::"none","deferred due to recent antihistamine use","labs sent instead"," "}  Spirometry: results normal (FEV1: 2.05/83%, FVC: 2.55/81%, FEV1/FVC: 80%).    Spirometry consistent with normal pattern. {Blank single:19197::"Albuterol/Atrovent nebulizer","Xopenex/Atrovent nebulizer","Albuterol nebulizer","Albuterol four puffs via MDI","Xopenex four puffs via MDI"} treatment given in clinic with {Blank single:19197::"significant improvement in FEV1 per ATS criteria","significant improvement in FVC per ATS criteria","significant improvement in FEV1 and FVC per ATS criteria","improvement in FEV1, but not significant per ATS criteria","improvement in FVC, but not significant per ATS criteria","improvement in FEV1 and FVC, but not significant per ATS criteria","no improvement"}.  Allergy Studies: {Blank single:19197::"none","labs sent instead"," "}    {Blank single:19197::"Allergy testing results were read and interpreted by myself, documented by clinical staff."," "}      Salvatore Marvel, MD  [facility] [place] [facility]

## 2022-04-14 ENCOUNTER — Ambulatory Visit (INDEPENDENT_AMBULATORY_CARE_PROVIDER_SITE_OTHER): Payer: BLUE CROSS/BLUE SHIELD | Admitting: Obstetrics & Gynecology

## 2022-04-14 ENCOUNTER — Encounter: Payer: Self-pay | Admitting: Obstetrics & Gynecology

## 2022-04-14 VITALS — BP 124/80 | Ht 64.0 in | Wt 261.0 lb

## 2022-04-14 DIAGNOSIS — Z01419 Encounter for gynecological examination (general) (routine) without abnormal findings: Secondary | ICD-10-CM

## 2022-04-14 DIAGNOSIS — Z79890 Hormone replacement therapy (postmenopausal): Secondary | ICD-10-CM

## 2022-04-14 DIAGNOSIS — Z6841 Body Mass Index (BMI) 40.0 and over, adult: Secondary | ICD-10-CM

## 2022-04-14 MED ORDER — ESTRADIOL 1 MG PO TABS: 0.5000 mg | ORAL_TABLET | Freq: Every day | ORAL | 4 refills | Status: DC

## 2022-04-14 MED ORDER — PROGESTERONE MICRONIZED 100 MG PO CAPS: 100.0000 mg | ORAL_CAPSULE | Freq: Every day | ORAL | 1 refills | Status: DC

## 2022-04-14 NOTE — Progress Notes (Signed)
Allison Frazier 1960/03/24 [id number]   History:    62 y.o.  G3P3L3 Married.   Has 4 grand-children.     RP:  Established patient presenting for annual gyn exam    HPI: Postmenopause, well on HRT x 7 years.  Hot flushes and night sweats mostly controled on current dosage.  No PMB.  No pelvic pain.  No pain with IC.  Pap Neg 12/2020.  Urine/BMs wnl.  Breasts wnl. Screening mammo 04/2021 Neg.  Scheduled next week.  BMI 45.63. Physically active.  Health labs with Fam MD. DEXA 09/28/18 very mild Osteopenia only at the Rt Femoral Neck with T-Score at -1.1, all other sites normal. Recommend repeating at 5 yrs. Colono 08/22/11.  Cologard 12/2020. Health labs with Fam MD.   Past medical history,surgical history, family history and social history were all reviewed and documented in the EPIC chart.  Gynecologic History Patient's last menstrual period was 12/29/2012.  Obstetric History OB History  Gravida Para Term Preterm AB Living  '3 3 3     3  '$ SAB IAB Ectopic Multiple Live Births          3    # Outcome Date GA Lbr Len/2nd Weight Sex Delivery Anes PTL Lv  3 Term     F CS-Unspec  N LIV  2 Term     M CS-Unspec  N LIV  1 Term     F Vag-Spont  N LIV     ROS: A ROS was performed and pertinent positives and negatives are included in the history. GENERAL: No fevers or chills. HEENT: No change in vision, no earache, sore throat or sinus congestion. NECK: No pain or stiffness. CARDIOVASCULAR: No chest pain or pressure. No palpitations. PULMONARY: No shortness of breath, cough or wheeze. GASTROINTESTINAL: No abdominal pain, nausea, vomiting or diarrhea, melena or bright red blood per rectum. GENITOURINARY: No urinary frequency, urgency, hesitancy or dysuria. MUSCULOSKELETAL: No joint or muscle pain, no back pain, no recent trauma. DERMATOLOGIC: No rash, no itching, no lesions. ENDOCRINE: No polyuria, polydipsia, no heat or cold intolerance. No recent change in weight. HEMATOLOGICAL: No anemia or easy  bruising or bleeding. NEUROLOGIC: No headache, seizures, numbness, tingling or weakness. PSYCHIATRIC: No depression, no loss of interest in normal activity or change in sleep pattern.     Exam:   BP 124/80 (BP Location: Right Arm, Patient Position: Sitting, Cuff Size: Large)   Ht '5\' 4"'$  (1.626 m)   Wt 261 lb (118.4 kg)   LMP 12/29/2012 Comment: BTL  BMI 44.80 kg/m   Body mass index is 44.8 kg/m.  General appearance : Well developed well nourished female. No acute distress HEENT: Eyes: no retinal hemorrhage or exudates,  Neck supple, trachea midline, no carotid bruits, no thyroidmegaly Lungs: Clear to auscultation, no rhonchi or wheezes, or rib retractions  Heart: Regular rate and rhythm, no murmurs or gallops Breast:Examined in sitting and supine position were symmetrical in appearance, no palpable masses or tenderness,  no skin retraction, no nipple inversion, no nipple discharge, no skin discoloration, no axillary or supraclavicular lymphadenopathy Abdomen: no palpable masses or tenderness, no rebound or guarding Extremities: no edema or skin discoloration or tenderness  Pelvic: Vulva: Normal             Vagina: No gross lesions or discharge  Cervix: No gross lesions or discharge  Uterus  AV, normal size, shape and consistency, non-tender and mobile  Adnexa  Without masses or tenderness  Anus: Normal  Assessment/Plan:  62 y.o. female for annual exam   1. Well female exam with routine gynecological exam Postmenopause, well on HRT x 6 years.  Hot flushes and night sweats mostly controled on current dosage with Estradiol 0.5 mg PO daily and Prometrium 100 mg PO HS.  No PMB.  Had a HSC/D+C/Myosure in 10/2021, patho benign. No pelvic pain.  No pain with IC.  Pap Neg 12/2020.  Urine/BMs wnl.  Breasts wnl. Screening mammo 04/2021 Neg, scheduled next week.  BMI 45.63. Physically active.  Health labs with Fam MD. DEXA 09/28/18 very mild Osteopenia only at the Rt Femoral Neck with T-Score at  -1.1, all other sites normal. Recommend repeating at 5 yrs. Colono 08/22/11.  Cologard 12/2020. Health labs with Fam MD.  2. Postmenopausal hormone replacement therapy Postmenopause, well on HRT x 6 years.  Hot flushes and night sweats mostly controled on current dosage with Estradiol 0.5 mg PO daily and Prometrium 100 mg PO HS.  No PMB.  Had a HSC/D+C/Myosure in 10/2021, patho benign. No pelvic pain.  No pain with IC.   3. Class 3 severe obesity due to excess calories with serious comorbidity and body mass index (BMI) of 40.0 to 44.9 in adult (HCC) BMI 45.63. Physically active at work.  Recommend a low calorie/carb diet.  Other orders - progesterone (PROMETRIUM) 100 MG capsule; Take 1 capsule (100 mg total) by mouth at bedtime. - estradiol (ESTRACE) 1 MG tablet; Take 0.5 tablets (0.5 mg total) by mouth daily. 1/2 tab daily   Princess Bruins MD, 3:30 PM

## 2022-04-23 ENCOUNTER — Encounter: Payer: Self-pay | Admitting: Obstetrics & Gynecology

## 2022-04-30 ENCOUNTER — Telehealth: Payer: Self-pay

## 2022-04-30 MED ORDER — PROGESTERONE MICRONIZED 100 MG PO CAPS: 100.0000 mg | ORAL_CAPSULE | Freq: Every day | ORAL | 4 refills | Status: DC

## 2022-04-30 NOTE — Telephone Encounter (Signed)
Patient had AEX 04/14/22.  She is questioning why her generic Prometrium was prescribed for only one refill for the year?  progesterone (PROMETRIUM) 100 MG capsule [id number]   Order Details Dose: 100 mg Route: Oral Frequency: Daily at bedtime  Dispense Quantity: 30 capsule Refills: 1        Sig: Take 1 capsule (100 mg total) by mouth at bedtime.       Start Date: 04/14/22 End Date: --

## 2022-04-30 NOTE — Telephone Encounter (Signed)
Allison Bruins, MD  You33 minutes ago (11:29 AM)    Please send Prometrium 100 mg PO HS #90, refill x 4.  Sorry about that. Dr Carlean Jews

## 2022-04-30 NOTE — Telephone Encounter (Signed)
Per DPR access note on file I left detailed message in vm that I sent her Rx with 90 days supply and 4 refills and offered Dr. Mariah Milling apology for that mixup.  Rx sent.

## 2022-07-11 ENCOUNTER — Encounter: Payer: Self-pay | Admitting: Allergy & Immunology

## 2022-07-11 ENCOUNTER — Ambulatory Visit: Payer: BLUE CROSS/BLUE SHIELD | Admitting: Allergy & Immunology

## 2022-07-11 VITALS — BP 126/78 | HR 80 | Temp 98.1°F | Resp 16 | Ht 65.75 in | Wt 266.0 lb

## 2022-07-11 DIAGNOSIS — J302 Other seasonal allergic rhinitis: Secondary | ICD-10-CM

## 2022-07-11 DIAGNOSIS — J3089 Other allergic rhinitis: Secondary | ICD-10-CM

## 2022-07-11 DIAGNOSIS — J4520 Mild intermittent asthma, uncomplicated: Secondary | ICD-10-CM

## 2022-07-11 MED ORDER — FLUTICASONE FUROATE-VILANTEROL 200-25 MCG/ACT IN AEPB: 1.0000 | INHALATION_SPRAY | Freq: Every day | RESPIRATORY_TRACT | 5 refills | Status: DC

## 2022-07-11 MED ORDER — CETIRIZINE HCL 10 MG PO TABS: 10.0000 mg | ORAL_TABLET | Freq: Every day | ORAL | 1 refills | Status: DC | PRN

## 2022-07-11 MED ORDER — AIRSUPRA 90-80 MCG/ACT IN AERO: 2.0000 | INHALATION_SPRAY | RESPIRATORY_TRACT | 5 refills | Status: AC | PRN

## 2022-07-11 MED ORDER — AZELASTINE HCL 137 MCG/SPRAY NA SOLN: 2.0000 | Freq: Two times a day (BID) | NASAL | 1 refills | Status: DC | PRN

## 2022-07-11 NOTE — Patient Instructions (Addendum)
1. Seasonal and perennial allergic rhinitis (grasses, ragweed, trees, outdoor molds, and cockroach) - Information on allergy shots provided. - CPT codes provided (you will need TWO VIALS).  - Continue with montelukast 10mg  daily. - Limit Sinex use since this has an Afrin-like compound in it.  - Continue with the azelastine two sprays per nostril up to twice daily. - Continue with cetirizine, but increase to TWICE DAILY. - Call us when you make a decision on allergy shots.  - This creates permanent immune changes to help with your allergic rhinitis.   2. Mild persistent asthma, uncomplicated - Lung testing looked perfect today - like half a liter higher!  - Daily controller medication(s): Breo 100/6mcg one puff once daily - Prior to physical activity: AirSupra two puffs 10-15 minutes before physical activity. - Rescue medications: AirSupra 2 puffs every 4-6 hours as needed - Asthma control goals:  * Full participation in all desired activities (may need albuterol before activity) * Albuterol use two time or less a week on average (not counting use with activity) * Cough interfering with sleep two time or less a month * Oral steroids no more than once a year * No hospitalizations  3. Return in about 6 months (around 01/10/2023).   Thanks for taking care of all of those babies!    Please inform us of any Emergency Department visits, hospitalizations, or changes in symptoms. Call us before going to the ED for breathing or allergy symptoms since we might be able to fit you in for a sick visit. Feel free to contact us anytime with any questions, problems, or concerns.  It was a pleasure to see you again today!  Websites that have reliable patient information: 1. American Academy of Asthma, Allergy, and Immunology: www.aaaai.org 2. Food Allergy Research and Education (FARE): foodallergy.org 3. Mothers of Asthmatics: http://www.asthmacommunitynetwork.org 4. American College of Allergy,  Asthma, and Immunology: www.acaai.org   COVID-19 Vaccine Information can be found at: [email or web address] For questions related to vaccine distribution or appointments, please email [email or web address] or call [phone].   We realize that you might be concerned about having an allergic reaction to the COVID19 vaccines. To help with that concern, WE ARE OFFERING THE COVID19 VACCINES IN OUR OFFICE! Ask the front desk for dates!     "Like" [place] on Facebook and Instagram for our latest updates!      A healthy democracy works best when [employer] participate! Make sure you are registered to vote! If you have moved or changed any of your contact information, you will need to get this updated before voting!  In some cases, you MAY be able to register to vote online: [email or web address]      Allergy Shots  Allergies are the result of a chain reaction that starts in the immune system. Your immune system controls how your body defends itself. For instance, if you have an allergy to pollen, your immune system identifies pollen as an invader or allergen. Your immune system overreacts by producing antibodies called Immunoglobulin E (IgE). These antibodies travel to cells that release chemicals, causing an allergic reaction.  The concept behind allergy immunotherapy, whether it is received in the form of shots or tablets, is that the immune system can be desensitized to specific allergens that trigger allergy symptoms. Although it requires time and patience, the payback can be long-term relief. Allergy injections contain a dilute solution of those substances that you are allergic to based upon your skin testing and allergy history.   How Do  Allergy Shots Work?  Allergy shots work much like a vaccine. Your body responds to injected amounts of a particular allergen given in increasing doses, eventually developing  a resistance and tolerance to it. Allergy shots can lead to decreased, minimal or no allergy symptoms.  There generally are two phases: build-up and maintenance. Build-up often ranges from three to six months and involves receiving injections with increasing amounts of the allergens. The shots are typically given once or twice a week, though more rapid build-up schedules are sometimes used.  The maintenance phase begins when the most effective dose is reached. This dose is different for each person, depending on how allergic you are and your response to the build-up injections. Once the maintenance dose is reached, there are longer periods between injections, typically two to four weeks.  Occasionally doctors give cortisone-type shots that can temporarily reduce allergy symptoms. These types of shots are different and should not be confused with allergy immunotherapy shots.  Who Can Be Treated with Allergy Shots?  Allergy shots may be a good treatment approach for people with allergic rhinitis (hay fever), allergic asthma, conjunctivitis (eye allergy) or stinging insect allergy.   Before deciding to begin allergy shots, you should consider:   The length of allergy season and the severity of your symptoms  Whether medications and/or changes to your environment can control your symptoms  Your desire to avoid long-term medication use  Time: allergy immunotherapy requires a major time commitment  Cost: may vary depending on your insurance coverage  Allergy shots for children age 8 and older are effective and often well tolerated. They might prevent the onset of new allergen sensitivities or the progression to asthma.  Allergy shots are not started on patients who are pregnant but can be continued on patients who become pregnant while receiving them. In some patients with other medical conditions or who take certain common medications, allergy shots may be of risk. It is important to mention  other medications you talk to your allergist.   What are the two types of build-ups offered:   RUSH or Rapid Desensitization -- one day of injections lasting from 8:30-4:30pm, injections every 1 hour.  Approximately half of the build-up process is completed in that one day.  The following week, normal build-up is resumed, and this entails ~16 visits either weekly or twice weekly, until reaching your "maintenance dose" which is continued weekly until eventually getting spaced out to every month for a duration of 3 to 5 years. The regular build-up appointments are nurse visits where the injections are administered, followed by required monitoring for 30 minutes.    Traditional build-up -- weekly visits for 6 -12 months until reaching "maintenance dose", then continue weekly until eventually spacing out to every 4 weeks as above. At these appointments, the injections are administered, followed by required monitoring for 30 minutes.     Either way is acceptable, and both are equally effective. With the rush protocol, the advantage is that less time is spent here for injections overall AND you would also reach maintenance dosing faster (which is when the clinical benefit starts to become more apparent). Not everyone is a candidate for rapid desensitization.   IF we proceed with the RUSH protocol, there are premedications which must be taken the day before and the day after the rush only (this includes antihistamines, steroids, and Singulair).  After the rush day, no prednisone or Singulair is required, and we just recommend antihistamines taken on your injection day.  What Is An Estimate of the Costs?  If you are interested in starting allergy injections, please check with your insurance company about your coverage for both allergy vial sets and allergy injections.  Please do so prior to making the appointment to start injections.  The following are CPT codes to give to your insurance company. These are  the amounts we BILL to the insurance company, but the amount YOU WILL PAY and WE RECEIVE IS SUBSTANTIALLY LESS and depends on the contracts we have with different insurance companies.   Amount Billed to Insurance One allergy vial set  CPT 95165   $ 1200     Two allergy vial set  CPT 95165   $ 2400     Three allergy vial set  CPT 95165   $ 3600     One injection   CPT 95115   $ 35  Two injections   CPT 95117   $ 40 RUSH (Rapid Desensitization) CPT 95180 x 8 hours $500/hour  Regarding the allergy injections, your co-pay may or may not apply with each injection, so please confirm this with your insurance company. When you start allergy injections, 1 or 2 sets of vials are made based on your allergies.  Not all patients can be on one set of vials. A set of vials lasts 6 months to a year depending on how quickly you can proceed with your build-up of your allergy injections. Vials are personalized for each patient depending on their specific allergens.  How often are allergy injection given during the build-up period?   Injections are given at least weekly during the build-up period until your maintenance dose is achieved. Per the doctor's discretion, you may have the option of getting allergy injections two times per week during the build-up period. However, there must be at least 48 hours between injections. The build-up period is usually completed within 6-12 months depending on your ability to schedule injections and for adjustments for reactions. When maintenance dose is reached, your injection schedule is gradually changed to every two weeks and later to every three weeks. Injections will then continue every 4 weeks. Usually, injections are continued for a total of 3-5 years.   When Will I Feel Better?  Some may experience decreased allergy symptoms during the build-up phase. For others, it may take as long as 12 months on the maintenance dose. If there is no improvement after a year of  maintenance, your allergist will discuss other treatment options with you.  If you aren't responding to allergy shots, it may be because there is not enough dose of the allergen in your vaccine or there are missing allergens that were not identified during your allergy testing. Other reasons could be that there are high levels of the allergen in your environment or major exposure to non-allergic triggers like tobacco smoke.  What Is the Length of Treatment?  Once the maintenance dose is reached, allergy shots are generally continued for three to five years. The decision to stop should be discussed with your allergist at that time. Some people may experience a permanent reduction of allergy symptoms. Others may relapse and a longer course of allergy shots can be considered.  What Are the Possible Reactions?  The two types of adverse reactions that can occur with allergy shots are local and systemic. Common local reactions include very mild redness and swelling at the injection site, which can happen immediately or several hours after. Report a delayed reaction from your last  injection. These include arm swelling or runny nose, watery eyes or cough that occurs within 12-24 hours after injection. A systemic reaction, which is less common, affects the entire body or a particular body system. They are usually mild and typically respond quickly to medications. Signs include increased allergy symptoms such as sneezing, a stuffy nose or hives.   Rarely, a serious systemic reaction called anaphylaxis can develop. Symptoms include swelling in the throat, wheezing, a feeling of tightness in the chest, nausea or dizziness. Most serious systemic reactions develop within 30 minutes of allergy shots. This is why it is strongly recommended you wait in your doctor's office for 30 minutes after your injections. Your allergist is trained to watch for reactions, and his or her staff is trained and equipped with the proper  medications to identify and treat them.   Report to the nurse immediately if you experience any of the following symptoms: swelling, itching or redness of the skin, hives, watery eyes/nose, breathing difficulty, excessive sneezing, coughing, stomach pain, diarrhea, or light headedness. These symptoms may occur within 15-20 minutes after injection and may require medication.   Who Should Administer Allergy Shots?  The preferred location for receiving shots is your prescribing allergist's office. Injections can sometimes be given at another facility where the physician and staff are trained to recognize and treat reactions, and have received instructions by your prescribing allergist.  What if I am late for an injection?   Injection dose will be adjusted depending upon how many days or weeks you are late for your injection.   What if I am sick?   Please report any illness to the nurse before receiving injections. She may adjust your dose or postpone injections depending on your symptoms. If you have fever, flu, sinus infection or chest congestion it is best to postpone allergy injections until you are better. Never get an allergy injection if your asthma is causing you problems. If your symptoms persist, seek out medical care to get your health problem under control.  What If I am or Become Pregnant:  Women that become pregnant should schedule an appointment with [facility] before receiving any further allergy injections.

## 2022-07-11 NOTE — Progress Notes (Signed)
FOLLOW UP  Date of Service/Encounter:  07/11/22   Assessment:   Seasonal and perennial allergic rhinitis (grasses, ragweed, trees, outdoor molds, and cockroach) - doing ok on the current regimen, but interested in starting allergen immunotherapy   Mild intermittent asthma, uncomplicated - doing well with Breo and AirSupra today with normalization of the spirometric findings     Plan/Recommendations:   1. Seasonal and perennial allergic rhinitis (grasses, ragweed, trees, outdoor molds, and cockroach) - Information on allergy shots provided. - CPT codes provided (you will need TWO VIALS).  - Continue with montelukast 10mg  daily. - Limit Sinex use since this has an Afrin-like compound in it.  - Continue with the azelastine two sprays per nostril up to twice daily. - Continue with cetirizine, but increase to TWICE DAILY. - Call us when you make a decision on allergy shots.  - This creates permanent immune changes to help with your allergic rhinitis.   2. Mild persistent asthma, uncomplicated - Lung testing looked perfect today - like half a liter higher!  - Daily controller medication(s): Breo 100/59mcg one puff once daily - Prior to physical activity: AirSupra two puffs 10-15 minutes before physical activity. - Rescue medications: AirSupra 2 puffs every 4-6 hours as needed - Asthma control goals:  * Full participation in all desired activities (may need albuterol before activity) * Albuterol use two time or less a week on average (not counting use with activity) * Cough interfering with sleep two time or less a month * Oral steroids no more than once a year * No hospitalizations  3. Return in about 6 months (around 01/10/2023).   Subjective:   Allison Frazier is a 62 y.o. female presenting today for follow up of  Chief Complaint  Patient presents with   Asthma    Says she uses the breo daily. Has not used the albuterol.    Allergic Rhinitis     Says she still suffers  every morning.     Allison Frazier has a history of the following: Patient Active Problem List   Diagnosis Date Noted   Acute focal neurological deficit 12/12/2017   Left facial numbness 12/12/2017   Acute focal neurologic deficit with partial resolution 12/12/2017   Near syncope 02/17/2013   Morbid obesity (HCC) 07/11/2012   Overweight(278.02) 06/23/2012   Fibroid uterus 05/05/2011   High cholesterol     History obtained from: chart review and patient.  Allison Frazier is a 62 y.o. female presenting for a follow up visit.  We last saw her in March 2024.  At that time, she was interested in starting allergy shots.  We continue with montelukast and restarted her Astelin.  We also recommended that she restart her antihistamine twice a day.  For her asthma, she was wheezing a bit.  We started her on Breo 100 mcg 1 puff once daily as well as AirSupra as needed.  Since last visit, she has continued to have issues with her allergic rhinitis.   Asthma/Respiratory Symptom History: She remains on the Breo one puff once daily.  She also has her rescue inhaler (AirSupra) to use as needed.  She does not feel any different with the [facility], but her husband has not complained about her wheezing.  Her spirometry today shows half a liter improvement in her FEV1 and her FVC.  She has not been on prednisone.  She has not been to the hospital since we saw her.  Allergic Rhinitis Symptom History: She had sedation from  the levocetirizine. She is now on cetirizine one at night. She has not tried doubling that. She was on the montelukast 10mg   which she did not think was super helpful. She is very miserable. She has not tried doubling this up. She did not check on the allergy shots.  She remains interested in allergen immunotherapy. She has met her deductible.   Otherwise, there have been no changes to her past medical history, surgical history, family history, or social history.    Review of Systems  Constitutional:  Negative.  Negative for chills, fever, malaise/fatigue and weight loss.  HENT:  Positive for congestion. Negative for ear discharge, ear pain and sinus pain.   Eyes:  Negative for pain, discharge and redness.  Respiratory:  Negative for cough, sputum production, shortness of breath and wheezing.   Cardiovascular: Negative.  Negative for chest pain and palpitations.  Gastrointestinal:  Negative for abdominal pain, constipation, diarrhea, heartburn, nausea and vomiting.  Skin: Negative.  Negative for itching and rash.  Neurological:  Negative for dizziness and headaches.  Endo/Heme/Allergies:  Positive for environmental allergies. Does not bruise/bleed easily.       Objective:   Blood pressure 126/78, pulse 80, temperature 98.1 F (36.7 C), temperature source Temporal, resp. rate 16, height 5' 5.75" (1.67 m), weight 266 lb (120.7 kg), last menstrual period 12/29/2012, SpO2 96 %. Body mass index is 43.26 kg/m.    Physical Exam Vitals reviewed.  Constitutional:      Appearance: She is well-developed.     Comments: Delightful.   HENT:     Head: Normocephalic and atraumatic.     Right Ear: Tympanic membrane, ear canal and external ear normal. No drainage, swelling or tenderness. Tympanic membrane is not injected, scarred, erythematous, retracted or bulging.     Left Ear: Tympanic membrane, ear canal and external ear normal. No drainage, swelling or tenderness. Tympanic membrane is not injected, scarred, erythematous, retracted or bulging.     Nose: Rhinorrhea present. No nasal deformity, septal deviation or mucosal edema.     Right Turbinates: Enlarged, swollen and pale.     Left Turbinates: Enlarged, swollen and pale.     Right Sinus: No maxillary sinus tenderness or frontal sinus tenderness.     Left Sinus: No maxillary sinus tenderness or frontal sinus tenderness.     Comments: Polypoid sinus degeneration. She continues to have a lot of rhinorrhea. This is clear colored.      Mouth/Throat:     Mouth: Mucous membranes are not pale and not dry.     Pharynx: Uvula midline.  Eyes:     General:        Right eye: No discharge.        Left eye: No discharge.     Conjunctiva/sclera: Conjunctivae normal.     Right eye: Right conjunctiva is not injected. No chemosis.    Left eye: Left conjunctiva is not injected. No chemosis.    Pupils: Pupils are equal, round, and reactive to light.  Cardiovascular:     Rate and Rhythm: Normal rate and regular rhythm.     Heart sounds: Normal heart sounds.  Pulmonary:     Effort: Pulmonary effort is normal. No tachypnea, accessory muscle usage or respiratory distress.     Breath sounds: Normal breath sounds. No wheezing, rhonchi or rales.     Comments: Moving air well in all lung fields. No increased work of breathing noted.  Chest:     Chest wall: No tenderness.  Abdominal:  Tenderness: There is no abdominal tenderness. There is no guarding or rebound.  Lymphadenopathy:     Head:     Right side of head: No submandibular, tonsillar or occipital adenopathy.     Left side of head: No submandibular, tonsillar or occipital adenopathy.     Cervical: No cervical adenopathy.  Skin:    General: Skin is warm.     Capillary Refill: Capillary refill takes less than 2 seconds.     Coloration: Skin is not pale.     Findings: No abrasion, erythema, petechiae or rash. Rash is not papular, urticarial or vesicular.  Neurological:     Mental Status: She is alert.  Psychiatric:        Behavior: Behavior is cooperative.      Diagnostic studies:    Spirometry: results normal (FEV1: 2.48/98%, FVC: 3.20/99%, FEV1/FVC: 78%).    Spirometry consistent with normal pattern.    Allergy Studies: none      Malachi Bonds, MD  [facility]

## 2022-07-14 NOTE — Addendum Note (Signed)
Addended by: Elsworth Soho on: 07/14/2022 11:26 AM   Modules accepted: Orders

## 2022-12-30 ENCOUNTER — Other Ambulatory Visit: Payer: Self-pay | Admitting: Allergy & Immunology

## 2023-01-14 ENCOUNTER — Encounter: Payer: Self-pay | Admitting: Allergy & Immunology

## 2023-01-14 ENCOUNTER — Other Ambulatory Visit: Payer: Self-pay

## 2023-01-14 ENCOUNTER — Ambulatory Visit: Payer: BLUE CROSS/BLUE SHIELD | Admitting: Allergy & Immunology

## 2023-01-14 VITALS — BP 130/78 | HR 98 | Temp 98.0°F | Resp 18 | Ht 65.75 in | Wt 233.0 lb

## 2023-01-14 DIAGNOSIS — J4540 Moderate persistent asthma, uncomplicated: Secondary | ICD-10-CM | POA: Diagnosis not present

## 2023-01-14 DIAGNOSIS — J3089 Other allergic rhinitis: Secondary | ICD-10-CM

## 2023-01-14 DIAGNOSIS — J302 Other seasonal allergic rhinitis: Secondary | ICD-10-CM

## 2023-01-14 MED ORDER — MONTELUKAST SODIUM 10 MG PO TABS: 10.0000 mg | ORAL_TABLET | Freq: Every day | ORAL | 1 refills | Status: DC

## 2023-01-14 MED ORDER — FLUTICASONE FUROATE-VILANTEROL 200-25 MCG/ACT IN AEPB: 1.0000 | INHALATION_SPRAY | Freq: Every day | RESPIRATORY_TRACT | 5 refills | Status: DC

## 2023-01-14 NOTE — Patient Instructions (Addendum)
1. Seasonal and perennial allergic rhinitis (grasses, ragweed, trees, outdoor molds, and cockroach) - Information on allergy shots provided. - I checked with our Billing Department to see about Surest.  - Continue with montelukast 10mg  daily (NEW SCRIPT SENT IN).  - Limit Sinex use since this has an Afrin-like compound in it.  - Continue with the azelastine two sprays per nostril up to twice daily. - Continue with cetirizine 1-2 times daily. - Call us when you make a decision on allergy shots.  - This creates permanent immune changes to help with your allergic rhinitis.   2. Mild persistent asthma, uncomplicated - Lung testing not done today since you are doing well.  - Daily controller medication(s): Breo 100/42mcg one puff once daily - Prior to physical activity: AirSupra two puffs 10-15 minutes before physical activity. - Rescue medications: AirSupra 2 puffs every 4-6 hours as needed - Asthma control goals:  * Full participation in all desired activities (may need albuterol before activity) * Albuterol use two time or less a week on average (not counting use with activity) * Cough interfering with sleep two time or less a month * Oral steroids no more than once a year * No hospitalizations  3. Return in about 6 months (around 07/15/2023). You can have the follow up appointment with Dr. Dellis Anes or a Nurse Practicioner (our Nurse Practitioners are excellent and always have Physician oversight!).    Please inform us of any Emergency Department visits, hospitalizations, or changes in symptoms. Call us before going to the ED for breathing or allergy symptoms since we might be able to fit you in for a sick visit. Feel free to contact us anytime with any questions, problems, or concerns.  It was a pleasure to see you again today!  Thanks for taking care of all of those babies!   Websites that have reliable patient information: 1. American Academy of Asthma, Allergy, and Immunology:  www.aaaai.org 2. Food Allergy Research and Education (FARE): foodallergy.org 3. Mothers of Asthmatics: http://www.asthmacommunitynetwork.org 4. American College of Allergy, Asthma, and Immunology: www.acaai.org      "Like" [place] on Facebook and Instagram for our latest updates!      A healthy democracy works best when [employer] participate! Make sure you are registered to vote! If you have moved or changed any of your contact information, you will need to get this updated before voting! Scan the QR codes below to learn more!           Allergy Shots  Allergies are the result of a chain reaction that starts in the immune system. Your immune system controls how your body defends itself. For instance, if you have an allergy to pollen, your immune system identifies pollen as an invader or allergen. Your immune system overreacts by producing antibodies called Immunoglobulin E (IgE). These antibodies travel to cells that release chemicals, causing an allergic reaction.  The concept behind allergy immunotherapy, whether it is received in the form of shots or tablets, is that the immune system can be desensitized to specific allergens that trigger allergy symptoms. Although it requires time and patience, the payback can be long-term relief. Allergy injections contain a dilute solution of those substances that you are allergic to based upon your skin testing and allergy history.   How Do Allergy Shots Work?  Allergy shots work much like a vaccine. Your body responds to injected amounts of a particular allergen given in increasing doses, eventually developing a resistance and tolerance to it. Allergy  shots can lead to decreased, minimal or no allergy symptoms.  There generally are two phases: build-up and maintenance. Build-up often ranges from three to six months and involves receiving injections with increasing amounts of the allergens. The shots are typically given once or twice a week, though  more rapid build-up schedules are sometimes used.  The maintenance phase begins when the most effective dose is reached. This dose is different for each person, depending on how allergic you are and your response to the build-up injections. Once the maintenance dose is reached, there are longer periods between injections, typically two to four weeks.  Occasionally doctors give cortisone-type shots that can temporarily reduce allergy symptoms. These types of shots are different and should not be confused with allergy immunotherapy shots.  Who Can Be Treated with Allergy Shots?  Allergy shots may be a good treatment approach for people with allergic rhinitis (hay fever), allergic asthma, conjunctivitis (eye allergy) or stinging insect allergy.   Before deciding to begin allergy shots, you should consider:   The length of allergy season and the severity of your symptoms  Whether medications and/or changes to your environment can control your symptoms  Your desire to avoid long-term medication use  Time: allergy immunotherapy requires a major time commitment  Cost: may vary depending on your insurance coverage  Allergy shots for children age 47 and older are effective and often well tolerated. They might prevent the onset of new allergen sensitivities or the progression to asthma.  Allergy shots are not started on patients who are pregnant but can be continued on patients who become pregnant while receiving them. In some patients with other medical conditions or who take certain common medications, allergy shots may be of risk. It is important to mention other medications you talk to your allergist.   What are the two types of build-ups offered:   RUSH or Rapid Desensitization -- one day of injections lasting from 8:30-4:30pm, injections every 1 hour.  Approximately half of the build-up process is completed in that one day.  The following week, normal build-up is resumed, and this entails ~16  visits either weekly or twice weekly, until reaching your "maintenance dose" which is continued weekly until eventually getting spaced out to every month for a duration of 3 to 5 years. The regular build-up appointments are nurse visits where the injections are administered, followed by required monitoring for 30 minutes.    Traditional build-up -- weekly visits for 6 -12 months until reaching "maintenance dose", then continue weekly until eventually spacing out to every 4 weeks as above. At these appointments, the injections are administered, followed by required monitoring for 30 minutes.     Either way is acceptable, and both are equally effective. With the rush protocol, the advantage is that less time is spent here for injections overall AND you would also reach maintenance dosing faster (which is when the clinical benefit starts to become more apparent). Not everyone is a candidate for rapid desensitization.   IF we proceed with the RUSH protocol, there are premedications which must be taken the day before and the day after the rush only (this includes antihistamines, steroids, and Singulair).  After the rush day, no prednisone or Singulair is required, and we just recommend antihistamines taken on your injection day.  What Is An Estimate of the Costs?  If you are interested in starting allergy injections, please check with your insurance company about your coverage for both allergy vial sets and allergy injections.  Please do so prior to making the appointment to start injections.  The following are CPT codes to give to your insurance company. These are the amounts we BILL to the insurance company, but the amount YOU WILL PAY and WE RECEIVE IS SUBSTANTIALLY LESS and depends on the contracts we have with different insurance companies.   Amount Billed to Insurance One allergy vial set  CPT 95165   $ 1200     Two allergy vial set  CPT 95165   $ 2400     Three allergy vial set  CPT 95165   $  3600     One injection   CPT 95115   $ 35  Two injections   CPT 95117   $ 40 RUSH (Rapid Desensitization) CPT 95180 x 8 hours $500/hour  Regarding the allergy injections, your co-pay may or may not apply with each injection, so please confirm this with your insurance company. When you start allergy injections, 1 or 2 sets of vials are made based on your allergies.  Not all patients can be on one set of vials. A set of vials lasts 6 months to a year depending on how quickly you can proceed with your build-up of your allergy injections. Vials are personalized for each patient depending on their specific allergens.  How often are allergy injection given during the build-up period?   Injections are given at least weekly during the build-up period until your maintenance dose is achieved. Per the doctor's discretion, you may have the option of getting allergy injections two times per week during the build-up period. However, there must be at least 48 hours between injections. The build-up period is usually completed within 6-12 months depending on your ability to schedule injections and for adjustments for reactions. When maintenance dose is reached, your injection schedule is gradually changed to every two weeks and later to every three weeks. Injections will then continue every 4 weeks. Usually, injections are continued for a total of 3-5 years.   When Will I Feel Better?  Some may experience decreased allergy symptoms during the build-up phase. For others, it may take as long as 12 months on the maintenance dose. If there is no improvement after a year of maintenance, your allergist will discuss other treatment options with you.  If you aren't responding to allergy shots, it may be because there is not enough dose of the allergen in your vaccine or there are missing allergens that were not identified during your allergy testing. Other reasons could be that there are high levels of the allergen in your  environment or major exposure to non-allergic triggers like tobacco smoke.  What Is the Length of Treatment?  Once the maintenance dose is reached, allergy shots are generally continued for three to five years. The decision to stop should be discussed with your allergist at that time. Some people may experience a permanent reduction of allergy symptoms. Others may relapse and a longer course of allergy shots can be considered.  What Are the Possible Reactions?  The two types of adverse reactions that can occur with allergy shots are local and systemic. Common local reactions include very mild redness and swelling at the injection site, which can happen immediately or several hours after. Report a delayed reaction from your last injection. These include arm swelling or runny nose, watery eyes or cough that occurs within 12-24 hours after injection. A systemic reaction, which is less common, affects the entire body or a particular body  system. They are usually mild and typically respond quickly to medications. Signs include increased allergy symptoms such as sneezing, a stuffy nose or hives.   Rarely, a serious systemic reaction called anaphylaxis can develop. Symptoms include swelling in the throat, wheezing, a feeling of tightness in the chest, nausea or dizziness. Most serious systemic reactions develop within 30 minutes of allergy shots. This is why it is strongly recommended you wait in your doctor's office for 30 minutes after your injections. Your allergist is trained to watch for reactions, and his or her staff is trained and equipped with the proper medications to identify and treat them.   Report to the nurse immediately if you experience any of the following symptoms: swelling, itching or redness of the skin, hives, watery eyes/nose, breathing difficulty, excessive sneezing, coughing, stomach pain, diarrhea, or light headedness. These symptoms may occur within 15-20 minutes after injection and  may require medication.   Who Should Administer Allergy Shots?  The preferred location for receiving shots is your prescribing allergist's office. Injections can sometimes be given at another facility where the physician and staff are trained to recognize and treat reactions, and have received instructions by your prescribing allergist.  What if I am late for an injection?   Injection dose will be adjusted depending upon how many days or weeks you are late for your injection.   What if I am sick?   Please report any illness to the nurse before receiving injections. She may adjust your dose or postpone injections depending on your symptoms. If you have fever, flu, sinus infection or chest congestion it is best to postpone allergy injections until you are better. Never get an allergy injection if your asthma is causing you problems. If your symptoms persist, seek out medical care to get your health problem under control.  What If I am or Become Pregnant:  Women that become pregnant should schedule an appointment with [facility] before receiving any further allergy injections.

## 2023-01-14 NOTE — Progress Notes (Signed)
FOLLOW UP  Date of Service/Encounter:  01/14/23   Assessment:   Seasonal and perennial allergic rhinitis (grasses, ragweed, trees, outdoor molds, and cockroach) - doing fair on the current regimen, but interested in starting allergen immunotherapy   Mild intermittent asthma, uncomplicated - doing well with Breo and AirSupra PRN  Plan/Recommendations:   1. Seasonal and perennial allergic rhinitis (grasses, ragweed, trees, outdoor molds, and cockroach) - Information on allergy shots provided. - I checked with our Billing Department to see about Surest.  - Continue with montelukast 10mg  daily (NEW SCRIPT SENT IN).  - Limit Sinex use since this has an Afrin-like compound in it.  - Continue with the azelastine two sprays per nostril up to twice daily. - Continue with cetirizine 1-2 times daily. - Call us when you make a decision on allergy shots.  - This creates permanent immune changes to help with your allergic rhinitis.   2. Mild persistent asthma, uncomplicated - Lung testing not done today since you are doing well.  - Daily controller medication(s): Breo 100/71mcg one puff once daily - Prior to physical activity: AirSupra two puffs 10-15 minutes before physical activity. - Rescue medications: AirSupra 2 puffs every 4-6 hours as needed - Asthma control goals:  * Full participation in all desired activities (may need albuterol before activity) * Albuterol use two time or less a week on average (not counting use with activity) * Cough interfering with sleep two time or less a month * Oral steroids no more than once a year * No hospitalizations  3. Return in about 6 months (around 07/15/2023). You can have the follow up appointment with Dr. Dellis Anes or a Nurse Practicioner (our Nurse Practitioners are excellent and always have Physician oversight!).     Subjective:   Allison Frazier is a 62 y.o. female presenting today for follow up of  Chief Complaint  Patient presents with    Asthma    No issues    Allergic Rhinitis     No issues     MALTA DOERSAM has a history of the following: Patient Active Problem List   Diagnosis Date Noted   Osteoarthritis of left hip 01/06/2018   Acute focal neurological deficit 12/12/2017   Left facial numbness 12/12/2017   Acute focal neurologic deficit with partial resolution 12/12/2017   Near syncope 02/17/2013   Morbid obesity (HCC) 07/11/2012   Overweight 06/23/2012   Fibroid uterus 05/05/2011   High cholesterol     History obtained from: chart review and patient.  Discussed the use of AI scribe software for clinical note transcription with the patient and/or guardian, who gave verbal consent to proceed.  Allison Frazier is a 62 y.o. female presenting for a follow up visit.  She was last seen in May 2024.  At that time, lung testing looked perfect.  We continue with the Breo 100 mcg 1 puff once daily as well as Airsupra 2 puffs as needed.  For her allergic rhinitis, we continued with cetirizine but increase to twice daily.  We also continue with azelastine 2 sprays per nostril up to twice daily.  Allergen immunotherapy was discussed.  Since the last visit, she has done well.   Asthma/Respiratory Symptom History:  She has been managing her breathing problems with Breo, but note that no treatment has been particularly effective. They have not had to use their inhaler recently, suggesting their respiratory symptoms are currently under control. They also report a significant weight loss of 33 pounds. She has  been getting compounded GLP1 inhibitor through her PCP.   Allergic Rhinitis Symptom History: The patient has a history of frequent sinus infections, typically experiencing two severe episodes per year. However, they note a decrease in the frequency of these infections over the past year. Despite this improvement, she continues to experience daily nasal congestion. They have tried various nasal sprays, but none have provided  significant relief.  She has been using the Sinex more than she should be. We have previously discussed this.   The patient's insurance recently changed, but it is unclear how this has affected their access to medications or medical care. They have not reported any new medications or changes to their current regimen. They have been prescribed montelukast in the past, but it was discontinued in March 2024. The patient does not recall finding this medication helpful.  Otherwise, there have been no changes to her past medical history, surgical history, family history, or social history.    Review of systems otherwise negative other than that mentioned in the HPI.    Objective:   Blood pressure 130/78, pulse 98, temperature 98 F (36.7 C), resp. rate 18, height 5' 5.75" (1.67 m), weight 233 lb (105.7 kg), last menstrual period 12/29/2012, SpO2 95%. Body mass index is 37.89 kg/m.    Physical Exam Vitals reviewed.  Constitutional:      Appearance: She is well-developed.     Comments: Delightful.   HENT:     Head: Normocephalic and atraumatic.     Right Ear: Tympanic membrane, ear canal and external ear normal. No drainage, swelling or tenderness. Tympanic membrane is not injected, scarred, erythematous, retracted or bulging.     Left Ear: Tympanic membrane, ear canal and external ear normal. No drainage, swelling or tenderness. Tympanic membrane is not injected, scarred, erythematous, retracted or bulging.     Nose: No nasal deformity, septal deviation, mucosal edema or rhinorrhea.     Right Turbinates: Enlarged, swollen and pale.     Left Turbinates: Enlarged, swollen and pale.     Right Sinus: No maxillary sinus tenderness or frontal sinus tenderness.     Left Sinus: No maxillary sinus tenderness or frontal sinus tenderness.     Comments: Polypoid sinus degeneration. She continues to have a lot of rhinorrhea.     Mouth/Throat:     Mouth: Mucous membranes are not pale and not dry.      Pharynx: Uvula midline.  Eyes:     General:        Right eye: No discharge.        Left eye: No discharge.     Conjunctiva/sclera: Conjunctivae normal.     Right eye: Right conjunctiva is not injected. No chemosis.    Left eye: Left conjunctiva is not injected. No chemosis.    Pupils: Pupils are equal, round, and reactive to light.  Cardiovascular:     Rate and Rhythm: Normal rate and regular rhythm.     Heart sounds: Normal heart sounds.  Pulmonary:     Effort: Pulmonary effort is normal. No tachypnea, accessory muscle usage or respiratory distress.     Breath sounds: Normal breath sounds. No wheezing, rhonchi or rales.     Comments: Moving air well in all lung fields. No increased work of breathing noted.  Chest:     Chest wall: No tenderness.  Abdominal:     Tenderness: There is no abdominal tenderness. There is no guarding or rebound.  Lymphadenopathy:     Head:  Right side of head: No submandibular, tonsillar or occipital adenopathy.     Left side of head: No submandibular, tonsillar or occipital adenopathy.     Cervical: No cervical adenopathy.  Skin:    General: Skin is warm.     Capillary Refill: Capillary refill takes less than 2 seconds.     Coloration: Skin is not pale.     Findings: No abrasion, erythema, petechiae or rash. Rash is not papular, urticarial or vesicular.  Neurological:     Mental Status: She is alert.  Psychiatric:        Behavior: Behavior is cooperative.      Diagnostic studies: none      Malachi Bonds, MD  [facility]

## 2023-01-16 ENCOUNTER — Telehealth: Payer: Self-pay

## 2023-01-16 DIAGNOSIS — J302 Other seasonal allergic rhinitis: Secondary | ICD-10-CM

## 2023-01-16 NOTE — Telephone Encounter (Signed)
Patient checked with her insurance and she wants to move forward with doing allergy shots. She wanted to start before the new year rolls around due to insurance.   Patient is scheduled for 02/09/2023  in [facility].

## 2023-01-19 DIAGNOSIS — J3081 Allergic rhinitis due to animal (cat) (dog) hair and dander: Secondary | ICD-10-CM | POA: Diagnosis not present

## 2023-01-19 NOTE — Progress Notes (Signed)
Aeroallergen Immunotherapy  Ordering Provider: Dr. Malachi Bonds  Patient Details Name: Allison Frazier MRN: [id number] Date of Birth: 1960/05/13  Order 1 of 2  Vial Label: G/T/H  0.6 ml (Volume)  BAU Concentration -- 7 Grass Mix* 100,000 ([address] [place], [place], [place], [place], [place], Sweet [place], [place]) 0.2 ml (Volume)  1:20 Concentration -- [place] 0.2 ml (Volume)  1:20 Concentration -- [place] 0.4 ml (Volume)  1:10 Concentration -- Pecan Pollen 0.3 ml (Volume)  1:10 Concentration -- Horse Epithelia   1.7  ml Extract Subtotal 3.3  ml Diluent 5.0  ml Maintenance Total  Schedule:  B  Blue Vial (1:100,000): Schedule B (6 doses) Yellow Vial (1:10,000): Schedule B (6 doses) Green Vial (1:1,000): Schedule B (6 doses) Red Vial (1:100): Schedule A (14 doses)  Special Instructions: After completion of the first Red Vial, please space to every two weeks. After completion of the second Red Vial, please space to every 4 weeks. Ok to up dose new vials at 0.22mL --> 0.3 mL --> 0.5 mL. Ok to come twice weekly, if desired, as long as there is 48 hours between injections.

## 2023-01-19 NOTE — Progress Notes (Signed)
VIALS EXP 03-11-23 

## 2023-01-19 NOTE — Progress Notes (Signed)
Aeroallergen Immunotherapy  Ordering Provider: Dr. Malachi Bonds  Patient Details Name: KHRYSTEN GARFINKLE MRN: [id number] Date of Birth: November 23, 1960  Order 2 of 2  Vial Label: RW/Molds/CR  0.6 ml (Volume)  1:20 Concentration -- Ragweed Mix 0.2 ml (Volume)  1:20 Concentration -- Alternaria alternata 0.2 ml (Volume)  1:20 Concentration -- Cladosporium herbarum 0.3 ml (Volume)  1:20 Concentration -- Cockroach, German   1.3  ml Extract Subtotal 3.7  ml Diluent 5.0  ml Maintenance Total  Schedule:  B  Blue Vial (1:100,000): Schedule B (6 doses) Yellow Vial (1:10,000): Schedule B (6 doses) Green Vial (1:1,000): Schedule B (6 doses) Red Vial (1:100): Schedule A (14 doses)  Special Instructions: After completion of the first Red Vial, please space to every two weeks. After completion of the second Red Vial, please space to every 4 weeks. Ok to up dose new vials at 0.31mL --> 0.3 mL --> 0.5 mL. Ok to come twice weekly, if desired, as long as there is 48 hours between injections.

## 2023-01-20 DIAGNOSIS — J302 Other seasonal allergic rhinitis: Secondary | ICD-10-CM | POA: Diagnosis not present

## 2023-02-09 ENCOUNTER — Ambulatory Visit (INDEPENDENT_AMBULATORY_CARE_PROVIDER_SITE_OTHER): Payer: BLUE CROSS/BLUE SHIELD

## 2023-02-09 DIAGNOSIS — J309 Allergic rhinitis, unspecified: Secondary | ICD-10-CM

## 2023-02-09 MED ORDER — EPINEPHRINE 0.3 MG/0.3ML IJ SOAJ: 0.3000 mg | INTRAMUSCULAR | 1 refills | Status: AC | PRN

## 2023-02-09 NOTE — Progress Notes (Signed)
Immunotherapy   Patient Details  Name: Allison Frazier MRN: [id number] Date of Birth: October 26, 1960  02/09/2023  Lucious [place] started injections for  grasses, trees, horses, ragweed's, molds, and cockroaches. Following schedule: B  Frequency:2 times per week Epi-Pen:Prescription for Epi-Pen given Consent signed and patient instructions given. Patient and her husband sat in the lobby for thirty minutes without an issue.    Ralene Muskrat 02/09/2023, 10:01 AM

## 2023-02-18 ENCOUNTER — Ambulatory Visit (INDEPENDENT_AMBULATORY_CARE_PROVIDER_SITE_OTHER): Payer: No Typology Code available for payment source

## 2023-02-18 DIAGNOSIS — J309 Allergic rhinitis, unspecified: Secondary | ICD-10-CM | POA: Diagnosis not present

## 2023-02-20 ENCOUNTER — Ambulatory Visit (INDEPENDENT_AMBULATORY_CARE_PROVIDER_SITE_OTHER): Payer: No Typology Code available for payment source

## 2023-02-20 DIAGNOSIS — J309 Allergic rhinitis, unspecified: Secondary | ICD-10-CM

## 2023-02-25 ENCOUNTER — Ambulatory Visit (INDEPENDENT_AMBULATORY_CARE_PROVIDER_SITE_OTHER): Payer: No Typology Code available for payment source

## 2023-02-25 DIAGNOSIS — J309 Allergic rhinitis, unspecified: Secondary | ICD-10-CM

## 2023-02-27 ENCOUNTER — Ambulatory Visit (INDEPENDENT_AMBULATORY_CARE_PROVIDER_SITE_OTHER): Payer: No Typology Code available for payment source

## 2023-02-27 DIAGNOSIS — J309 Allergic rhinitis, unspecified: Secondary | ICD-10-CM

## 2023-03-04 ENCOUNTER — Ambulatory Visit (INDEPENDENT_AMBULATORY_CARE_PROVIDER_SITE_OTHER): Payer: No Typology Code available for payment source

## 2023-03-04 DIAGNOSIS — J309 Allergic rhinitis, unspecified: Secondary | ICD-10-CM | POA: Diagnosis not present

## 2023-03-06 ENCOUNTER — Ambulatory Visit (INDEPENDENT_AMBULATORY_CARE_PROVIDER_SITE_OTHER): Payer: No Typology Code available for payment source

## 2023-03-06 DIAGNOSIS — J309 Allergic rhinitis, unspecified: Secondary | ICD-10-CM | POA: Diagnosis not present

## 2023-03-11 ENCOUNTER — Ambulatory Visit (INDEPENDENT_AMBULATORY_CARE_PROVIDER_SITE_OTHER): Payer: No Typology Code available for payment source | Admitting: *Deleted

## 2023-03-11 DIAGNOSIS — J309 Allergic rhinitis, unspecified: Secondary | ICD-10-CM | POA: Diagnosis not present

## 2023-03-13 ENCOUNTER — Ambulatory Visit (INDEPENDENT_AMBULATORY_CARE_PROVIDER_SITE_OTHER): Payer: No Typology Code available for payment source

## 2023-03-13 DIAGNOSIS — J309 Allergic rhinitis, unspecified: Secondary | ICD-10-CM | POA: Diagnosis not present

## 2023-03-18 ENCOUNTER — Ambulatory Visit (INDEPENDENT_AMBULATORY_CARE_PROVIDER_SITE_OTHER): Payer: No Typology Code available for payment source

## 2023-03-18 DIAGNOSIS — J309 Allergic rhinitis, unspecified: Secondary | ICD-10-CM

## 2023-03-20 ENCOUNTER — Ambulatory Visit (INDEPENDENT_AMBULATORY_CARE_PROVIDER_SITE_OTHER): Payer: No Typology Code available for payment source

## 2023-03-20 DIAGNOSIS — J309 Allergic rhinitis, unspecified: Secondary | ICD-10-CM | POA: Diagnosis not present

## 2023-03-25 ENCOUNTER — Ambulatory Visit (INDEPENDENT_AMBULATORY_CARE_PROVIDER_SITE_OTHER): Payer: No Typology Code available for payment source

## 2023-03-25 DIAGNOSIS — J309 Allergic rhinitis, unspecified: Secondary | ICD-10-CM | POA: Diagnosis not present

## 2023-03-27 ENCOUNTER — Ambulatory Visit (INDEPENDENT_AMBULATORY_CARE_PROVIDER_SITE_OTHER): Payer: No Typology Code available for payment source | Admitting: *Deleted

## 2023-03-27 DIAGNOSIS — J309 Allergic rhinitis, unspecified: Secondary | ICD-10-CM

## 2023-04-01 ENCOUNTER — Ambulatory Visit (INDEPENDENT_AMBULATORY_CARE_PROVIDER_SITE_OTHER): Payer: No Typology Code available for payment source

## 2023-04-01 DIAGNOSIS — J309 Allergic rhinitis, unspecified: Secondary | ICD-10-CM | POA: Diagnosis not present

## 2023-04-03 ENCOUNTER — Ambulatory Visit (INDEPENDENT_AMBULATORY_CARE_PROVIDER_SITE_OTHER): Payer: No Typology Code available for payment source

## 2023-04-03 DIAGNOSIS — J309 Allergic rhinitis, unspecified: Secondary | ICD-10-CM | POA: Diagnosis not present

## 2023-04-08 ENCOUNTER — Ambulatory Visit (INDEPENDENT_AMBULATORY_CARE_PROVIDER_SITE_OTHER): Payer: No Typology Code available for payment source

## 2023-04-08 DIAGNOSIS — J309 Allergic rhinitis, unspecified: Secondary | ICD-10-CM | POA: Diagnosis not present

## 2023-04-15 ENCOUNTER — Ambulatory Visit (INDEPENDENT_AMBULATORY_CARE_PROVIDER_SITE_OTHER)

## 2023-04-15 DIAGNOSIS — J309 Allergic rhinitis, unspecified: Secondary | ICD-10-CM | POA: Diagnosis not present

## 2023-04-20 ENCOUNTER — Ambulatory Visit: Payer: BLUE CROSS/BLUE SHIELD | Admitting: Obstetrics and Gynecology

## 2023-04-22 ENCOUNTER — Ambulatory Visit (INDEPENDENT_AMBULATORY_CARE_PROVIDER_SITE_OTHER)

## 2023-04-22 DIAGNOSIS — J309 Allergic rhinitis, unspecified: Secondary | ICD-10-CM | POA: Diagnosis not present

## 2023-04-24 ENCOUNTER — Ambulatory Visit (INDEPENDENT_AMBULATORY_CARE_PROVIDER_SITE_OTHER): Payer: Self-pay

## 2023-04-24 ENCOUNTER — Other Ambulatory Visit (HOSPITAL_COMMUNITY)
Admission: RE | Admit: 2023-04-24 | Discharge: 2023-04-24 | Disposition: A | Discharge status: Home / Self Care | Source: Ambulatory Visit | Attending: Obstetrics and Gynecology | Admitting: Obstetrics and Gynecology

## 2023-04-24 ENCOUNTER — Encounter: Payer: Self-pay | Admitting: Obstetrics and Gynecology

## 2023-04-24 ENCOUNTER — Ambulatory Visit (INDEPENDENT_AMBULATORY_CARE_PROVIDER_SITE_OTHER): Admitting: Obstetrics and Gynecology

## 2023-04-24 VITALS — BP 132/88 | HR 74 | Ht 64.5 in | Wt 220.0 lb

## 2023-04-24 DIAGNOSIS — Z1231 Encounter for screening mammogram for malignant neoplasm of breast: Secondary | ICD-10-CM

## 2023-04-24 DIAGNOSIS — J309 Allergic rhinitis, unspecified: Secondary | ICD-10-CM | POA: Diagnosis not present

## 2023-04-24 DIAGNOSIS — E2839 Other primary ovarian failure: Secondary | ICD-10-CM

## 2023-04-24 DIAGNOSIS — R062 Wheezing: Secondary | ICD-10-CM

## 2023-04-24 DIAGNOSIS — Z1331 Encounter for screening for depression: Secondary | ICD-10-CM

## 2023-04-24 DIAGNOSIS — Z01419 Encounter for gynecological examination (general) (routine) without abnormal findings: Secondary | ICD-10-CM | POA: Insufficient documentation

## 2023-04-24 DIAGNOSIS — N840 Polyp of corpus uteri: Secondary | ICD-10-CM

## 2023-04-24 DIAGNOSIS — Z1211 Encounter for screening for malignant neoplasm of colon: Secondary | ICD-10-CM

## 2023-04-24 DIAGNOSIS — R053 Chronic cough: Secondary | ICD-10-CM

## 2023-04-24 MED ORDER — PROGESTERONE MICRONIZED 100 MG PO CAPS: 100.0000 mg | ORAL_CAPSULE | Freq: Every day | ORAL | 4 refills | Status: AC

## 2023-04-24 MED ORDER — ESTRADIOL 1 MG PO TABS: 0.5000 mg | ORAL_TABLET | Freq: Every day | ORAL | 4 refills | Status: AC

## 2023-04-24 NOTE — Progress Notes (Signed)
 63 y.o. y.o. female here for annual exam. Patient's last menstrual period was 12/29/2012.    Pap 01/07/21, MMG 04/23/22, Bone Density 09/28/18 Colonoscopy  09/28/18  G3P3L3 Married.   Has 4 grand-children.     RP:  Established patient presenting for annual gyn exam    HPI: Postmenopause, well on HRT x 7 years.  Hot flushes and night sweats mostly controled on current dosage.  No PMB.  No pelvic pain.  No pain with IC.  Pap Neg 12/2020.  Urine/BMs wnl.  Breasts wnl. Screening mammo 04/2021 Neg.  Scheduled next week.  BMI 45.63. Physically active.  Health labs with Fam MD. DEXA 09/28/18 very mild Osteopenia only at the Rt Femoral Neck with T-Score at -1.1, all other sites normal. Recommend repeating 2 years [facility] 08/22/11.  Cologard 12/2020. Health labs with Fam MD. 2023 hysteroscopy to remove benign endometrial polyps. 2025 to get f/u PUS  Has had productive cough x6 weeks. White in color No fevers No cxr. Referral placed. To follow with PMD with results and management     04/24/2023   10:44 AM  Depression screen PHQ 2/9  Decreased Interest 0  Down, Depressed, Hopeless 0  PHQ - 2 Score 0    Blood pressure 132/88, pulse 74, height 5' 4.5" (1.638 m), weight 220 lb (99.8 kg), last menstrual period 12/29/2012, SpO2 96%.     Component Value Date/Time   DIAGPAP  01/07/2021 1545    - Negative for intraepithelial lesion or malignancy (NILM)   ADEQPAP  01/07/2021 1545    Satisfactory for evaluation; transformation zone component PRESENT.    GYN HISTORY:    Component Value Date/Time   DIAGPAP  01/07/2021 1545    - Negative for intraepithelial lesion or malignancy (NILM)   ADEQPAP  01/07/2021 1545    Satisfactory for evaluation; transformation zone component PRESENT.    OB History  Gravida Para Term Preterm AB Living  3 3 3   3   SAB IAB Ectopic Multiple Live Births      3    # Outcome Date GA Lbr Len/2nd Weight Sex Type Anes PTL Lv  3 Term     F CS-Unspec  N LIV  2 Term     M  CS-Unspec  N LIV  1 Term     F Vag-Spont  N LIV    Past Medical History:  Diagnosis Date   Asthma    Complication of anesthesia 1996   epidural worked stronger on left side   High cholesterol    History of kidney stones    Left facial numbness 12/13/2017   worked up at [employer] [facility] no cause found   PMB (postmenopausal bleeding)    Syncope 11/11/2017   worked up at [facility]   Upper respiratory infection    finished amoxicillian 09-22-2021, finished steroid dose pack 10-01-2021    Past Surgical History:  Procedure Laterality Date   CESAREAN SECTION  02/10/1994   CESAREAN SECTION  02/11/1996   CHOLECYSTECTOMY  02/03/2020   DILATATION & CURETTAGE/HYSTEROSCOPY WITH MYOSURE N/A 10/16/2021   Procedure: DILATATION & CURETTAGE/HYSTEROSCOPY WITH MYOSURE;  Surgeon: Genia Del, MD;  Location: [facility];  Service: Gynecology;  Laterality: N/A;   ENDOMETRIAL ABLATION  04/04/2007   HER OPTION   LASIX     REFRACTIVE SURGERY-  RIGHT EYE   TUBAL LIGATION  02/11/1996    Current Outpatient Medications on File Prior to Visit  Medication Sig Dispense Refill   Albuterol-Budesonide (AIRSUPRA)  90-80 MCG/ACT AERO Inhale 2 puffs into the lungs every 4 (four) hours as needed. 10.7 g 5   aspirin 81 MG EC tablet Take by mouth.     celecoxib (CELEBREX) 100 MG capsule Take 100 mg by mouth 2 (two) times daily.     cetirizine (ZYRTEC) 10 MG tablet TAKE 1 TABLET BY MOUTH DAILY AS NEEDED FOR ALLERGIES (CAN TAKE AN EXTRA DOSE DURING FLARE UPS.). 180 tablet 0   Cyanocobalamin (VITAMIN B 12 PO) Take by mouth.     doxylamine, Sleep, (UNISOM) 25 MG tablet Take 25 mg by mouth at bedtime.     EPINEPHrine (EPIPEN 2-PAK) 0.3 mg/0.3 mL IJ SOAJ injection Inject 0.3 mg into the muscle as needed for anaphylaxis. 2 each 1   estradiol (ESTRACE) 1 MG tablet Take 0.5 tablets (0.5 mg total) by mouth daily. 1/2 tab daily 45 tablet 4   ezetimibe (ZETIA) 10 MG tablet Take 10 mg by mouth daily.     fluticasone  furoate-vilanterol (BREO ELLIPTA) 200-25 MCG/ACT AEPB Inhale 1 puff into the lungs daily. 28 each 5   oxymetazoline (NASAL RELIEF) 0.05 % nasal spray Place 2 sprays into both nostrils 2 (two) times daily.     progesterone (PROMETRIUM) 100 MG capsule Take 1 capsule (100 mg total) by mouth at bedtime. 90 capsule 4   psyllium (METAMUCIL SMOOTH TEXTURE) 28 % packet Take 1 packet by mouth every morning. 2 tablespoons     Semaglutide-Weight Management 0.5 MG/0.5ML SOAJ Semaglutide 2.5 mg + Pyridoxine 10 mg/ml Fill as vials     montelukast (SINGULAIR) 10 MG tablet Take 1 tablet (10 mg total) by mouth at bedtime. 90 tablet 1   No current facility-administered medications on file prior to visit.    Social History   Socioeconomic History   Marital status: Married    Spouse name: Not on file   Number of children: Not on file   Years of education: Not on file   Highest education level: Not on file  Occupational History   Not on file  Tobacco Use   Smoking status: Never   Smokeless tobacco: Never  Vaping Use   Vaping status: Never Used  Substance and Sexual Activity   Alcohol use: Yes    Comment: social   Drug use: Never   Sexual activity: Yes    Partners: Male    Birth control/protection: Surgical, Post-menopausal    Comment: 1st intercourse- 16, partners- 10, btl  Other Topics Concern   Not on file  Social History Narrative   Not on file   Social Drivers of Health   Financial Resource Strain: Low Risk  (02/12/2023)   Received from [facility]   Overall Financial Resource Strain (CARDIA)    Difficulty of Paying Living Expenses: Not hard at all  Food Insecurity: No Food Insecurity (02/12/2023)   Received from [facility]   Hunger Vital Sign    Worried About Running Out of Food in the Last Year: Never true    Ran Out of Food in the Last Year: Never true  Transportation Needs: No Transportation Needs (02/12/2023)   Received from [facility] - Transportation    Lack of  Transportation (Medical): No    Lack of Transportation (Non-Medical): No  Physical Activity: Unknown (07/06/2022)   Received from [facility]   Exercise Vital Sign    Days of Exercise per Week: 0 days    Minutes of Exercise per Session: Not on file  Stress:  No Stress Concern Present (07/06/2022)   Received from [facility] - Occupational Stress Questionnaire    Feeling of Stress : Not at all  Social Connections: Socially Integrated (07/06/2022)   Received from [facility]   Social Network    How would you rate your social network (family, work, friends)?: Good participation with social networks  Intimate Partner Violence: Not At Risk (07/06/2022)   Received from [facility]   HITS    Over the last 12 months how often did your partner physically hurt you?: Never    Over the last 12 months how often did your partner insult you or talk down to you?: Never    Over the last 12 months how often did your partner threaten you with physical harm?: Never    Over the last 12 months how often did your partner scream or curse at you?: Never    Family History  Problem Relation Age of Onset   Heart disease Father    Cancer Father        Prostate   Allergic rhinitis Neg Hx    Asthma Neg Hx    Eczema Neg Hx    Urticaria Neg Hx      Allergies  Allergen Reactions   Other Swelling    Horses Horses   Atorvastatin Other (See Comments)    Lipitor   Simvastatin Other (See Comments)    BODY ACHES      Patient's last menstrual period was Patient's last menstrual period was 12/29/2012.Marland Kitchen            Review of Systems Alls systems reviewed and are negative.     Physical Exam Constitutional:      Appearance: Normal appearance.  Genitourinary:     Vulva and urethral meatus normal.     No lesions in the vagina.     Right Labia: No rash, lesions or skin changes.    Left Labia: No  lesions, skin changes or rash.    No vaginal discharge or tenderness.     No vaginal prolapse present.    No vaginal atrophy present.     Right Adnexa: not tender, not palpable and no mass present.    Left Adnexa: not tender, not palpable and no mass present.    No cervical motion tenderness or discharge.     Uterus is not enlarged, tender or irregular.  Breasts:    Right: Normal.     Left: Normal.  HENT:     Head: Normocephalic.  Neck:     Thyroid: No thyroid mass, thyromegaly or thyroid tenderness.  Cardiovascular:     Rate and Rhythm: Normal rate and regular rhythm.     Heart sounds: Normal heart sounds, S1 normal and S2 normal.  Pulmonary:     Effort: Pulmonary effort is normal.     Breath sounds: Normal air entry. Examination of the right-lower field reveals rhonchi. Examination of the left-lower field reveals rhonchi. Rhonchi present.  Abdominal:     General: There is no distension.     Palpations: Abdomen is soft. There is no mass.     Tenderness: There is no abdominal tenderness. There is no guarding or rebound.  Musculoskeletal:        General: Normal range of motion.     Cervical back: Full passive range of motion without pain, normal range of motion and neck supple. No tenderness.  Right lower leg: No edema.     Left lower leg: No edema.  Neurological:     Mental Status: She is alert.  Skin:    General: Skin is warm.  Psychiatric:        Mood and Affect: Mood normal.        Behavior: Behavior normal.        Thought Content: Thought content normal.  Vitals and nursing note reviewed. Exam conducted with a chaperone present.    CASE: [id number] PATIENT: Patsye Reither Surgical Pathology Report     Clinical History: PMP bleeding, endometrial polyp and endocervical polyp (crm)     FINAL MICROSCOPIC DIAGNOSIS:  A. ENDOMETRIUM POLYP AND ENDOCERV...    Cervical Cancer Screening History Report Surgical pathology Order: [id number]  Status: Edited  Result - FINAL     Next appt: 07/17/2023 at 10:30 AM in Allergy and Asthma  Alfonse Spruce, MD)   Test Result Released: Yes (seen)   2 Result Notes    Component Ref Range & Units (hover) 1 yr ago  SURGICAL PATHOLOGY SURGICAL PATHOLOGY CASE: [id number] PATIENT: Alexei Eagleton Surgical Pathology Report     Clinical History: PMP bleeding, endometrial polyp and endocervical polyp (crm)     FINAL MICROSCOPIC DIAGNOSIS:  A. ENDOMETRIUM POLYP AND ENDOCERVIX POLYP, CURETTAGE: -  Benign polyps with endocervical and endometrial features in the background of disordered proliferative endometrium, negative for atypia/hyperplasia.  GROSS DESCRIPTION:  A. Received in formalin labeled with the patients name and DOB is a 2.5 x 2.5 x 0.5 cm aggregate of tan soft tissue fragments engrossed in mucoid material, submitted in toto in two cassettes.  (LEF 10/16/2021)        A:         Well Woman GYN exam                             P:        Pap smear collected today Encouraged annual mammogram screening Colon cancer screening referral placed today DXA ordered today Labs and immunizations to do with PMD Discussed breast self exams Encouraged healthy lifestyle practices Encouraged Vit D and Calcium  CXR ordered: Will need medication from PCP, if indicated and she agreed and will schedule appointment with her. H/o endometrial polyps with removal on hysteroscopy.  To get follow-up US No follow-ups on file.  Earley Favor

## 2023-04-27 ENCOUNTER — Encounter: Payer: Self-pay | Admitting: Obstetrics and Gynecology

## 2023-04-27 LAB — CYTOLOGY - PAP
Adequacy: ABSENT
Diagnosis: NEGATIVE

## 2023-04-30 ENCOUNTER — Encounter: Payer: Self-pay | Admitting: Obstetrics and Gynecology

## 2023-05-01 ENCOUNTER — Ambulatory Visit (INDEPENDENT_AMBULATORY_CARE_PROVIDER_SITE_OTHER): Payer: Self-pay

## 2023-05-01 DIAGNOSIS — J309 Allergic rhinitis, unspecified: Secondary | ICD-10-CM

## 2023-05-08 ENCOUNTER — Encounter: Payer: Self-pay | Admitting: Obstetrics and Gynecology

## 2023-05-08 ENCOUNTER — Ambulatory Visit (INDEPENDENT_AMBULATORY_CARE_PROVIDER_SITE_OTHER): Payer: Self-pay

## 2023-05-08 DIAGNOSIS — J309 Allergic rhinitis, unspecified: Secondary | ICD-10-CM

## 2023-05-12 ENCOUNTER — Ambulatory Visit (INDEPENDENT_AMBULATORY_CARE_PROVIDER_SITE_OTHER): Admitting: Obstetrics and Gynecology

## 2023-05-12 ENCOUNTER — Ambulatory Visit

## 2023-05-12 ENCOUNTER — Encounter: Payer: Self-pay | Admitting: Obstetrics and Gynecology

## 2023-05-12 VITALS — BP 118/72 | HR 80

## 2023-05-12 DIAGNOSIS — N840 Polyp of corpus uteri: Secondary | ICD-10-CM | POA: Diagnosis not present

## 2023-05-12 DIAGNOSIS — R5382 Chronic fatigue, unspecified: Secondary | ICD-10-CM | POA: Diagnosis not present

## 2023-05-12 DIAGNOSIS — Z01419 Encounter for gynecological examination (general) (routine) without abnormal findings: Secondary | ICD-10-CM

## 2023-05-12 MED ORDER — INTRAROSA 6.5 MG VA INST: 1.0000 | VAGINAL_INSERT | Freq: Every evening | VAGINAL | 12 refills | Status: DC | PRN

## 2023-05-12 NOTE — Progress Notes (Signed)
   Acute Office Visit  Subjective:    Patient ID: Allison Frazier, female    DOB: December 26, 1960, 63 y.o.   MRN: [id number]   HPI 63 y.o. presents today for GYN Scan (PUS for recurrent endometrial polyps) . Presents today to discuss hormones and would like testosterone level checked and review DXA scan at [facility]   Pap 01/07/21, MMG 04/23/22, Bone Density 09/28/18 Colonoscopy  09/28/18  G3P3L3 Married.   Has 4 grand-children.     RP:  Established patient presenting for annual gyn exam    HPI: Postmenopause, well on HRT x 7 years.  Hot flushes and night sweats mostly controled on current dosage.  No PMB.  No pelvic pain.  No pain with IC.  Pap Neg 12/2020.  Urine/BMs wnl.  Breasts wnl. Screening mammo 04/2021 Neg.  Scheduled next week.  BMI 45.63. Physically active.  Health labs with Fam MD. DEXA 09/28/18 very mild Osteopenia only at the Rt Femoral Neck with T-Score at -1.1, all other sites normal. Recommend repeating 2 years [facility] 08/22/11.  Cologard 12/2020. Health labs with Fam MD. 2023 hysteroscopy to remove benign endometrial polyps. 2025 to get f/u PUS  Has had productive cough x6 weeks. White in color No fevers No cxr. Referral placed. To follow with PMD with results and management Patient's last menstrual period was 12/29/2012.    Review of Systems     Objective:    OBGyn Exam  BP 118/72   Pulse 80   LMP 12/29/2012 Comment: BTL  SpO2 97%  Wt Readings from Last 3 Encounters:  04/24/23 220 lb (99.8 kg)  01/14/23 233 lb (105.7 kg)  07/11/22 266 lb (120.7 kg)      PUS with  10.36cm uterus with multiple fibroids 4 measured and the size range from 1.49cm to 2.70cm Fibroids stable in size from 2023 scan  Endometrial lining is normal at 3.52mm  Normal atrophic ovaries   Patient informed chaperone available to be present for breast and/or pelvic exam. Patient has requested no chaperone to be present. Patient has been advised what will be completed during breast and pelvic  exam.   Assessment & Plan:  PUS results reviewed with patient. Fibroids are stable in comparison. Endometrial lining in normal range. Labs: see orders Doing well on her current HRT. May consider intrarosa for vaginal dryness and testosterone support Results from dxa scan left on mychart. Has osteopenia with positive frax score and could use to start treatment.  20 minutes spent on reviewing records, imaging,  and one on one patient time and counseling patient and documentation Dr. Judith Blonder

## 2023-05-13 LAB — CBC
HCT: 46.9 % — ABNORMAL HIGH (ref 35.0–45.0)
Hemoglobin: 15.3 g/dL (ref 11.7–15.5)
MCH: 28.8 pg (ref 27.0–33.0)
MCHC: 32.6 g/dL (ref 32.0–36.0)
MCV: 88.3 fL (ref 80.0–100.0)
MPV: 11.6 fL (ref 7.5–12.5)
Platelets: 262 10*3/uL (ref 140–400)
RBC: 5.31 10*6/uL — ABNORMAL HIGH (ref 3.80–5.10)
RDW: 12.7 % (ref 11.0–15.0)
WBC: 6.8 10*3/uL (ref 3.8–10.8)

## 2023-05-13 LAB — COMPREHENSIVE METABOLIC PANEL WITH GFR
AG Ratio: 2.1 (calc) (ref 1.0–2.5)
ALT: 19 U/L (ref 6–29)
AST: 12 U/L (ref 10–35)
Albumin: 4.5 g/dL (ref 3.6–5.1)
Alkaline phosphatase (APISO): 65 U/L (ref 37–153)
BUN: 21 mg/dL (ref 7–25)
CO2: 27 mmol/L (ref 20–32)
Calcium: 10.9 mg/dL — ABNORMAL HIGH (ref 8.6–10.4)
Chloride: 104 mmol/L (ref 98–110)
Creat: 0.59 mg/dL (ref 0.50–1.05)
Globulin: 2.1 g/dL (ref 1.9–3.7)
Glucose, Bld: 95 mg/dL (ref 65–99)
Potassium: 4.3 mmol/L (ref 3.5–5.3)
Sodium: 139 mmol/L (ref 135–146)
Total Bilirubin: 0.5 mg/dL (ref 0.2–1.2)
Total Protein: 6.6 g/dL (ref 6.1–8.1)
eGFR: 102 mL/min/{1.73_m2} (ref >=60)

## 2023-05-13 LAB — VITAMIN D 25 HYDROXY (VIT D DEFICIENCY, FRACTURES): Vit D, 25-Hydroxy: 40 ng/mL (ref 30–100)

## 2023-05-13 LAB — TSH: TSH: 1.49 m[IU]/L (ref 0.40–4.50)

## 2023-05-14 ENCOUNTER — Telehealth: Payer: Self-pay | Admitting: *Deleted

## 2023-05-14 NOTE — Telephone Encounter (Signed)
-----   Message from Earley Favor sent at 05/12/2023 10:57 AM EDT ----- Can we run PA for evenity and or prolia Osteopenia with positive frax and history of fracture. Dr. Karma Greaser

## 2023-05-14 NOTE — Telephone Encounter (Signed)
 Insurance information submitted to [employer] portal. Will await summary of benefits for Evenity.

## 2023-05-15 ENCOUNTER — Ambulatory Visit (INDEPENDENT_AMBULATORY_CARE_PROVIDER_SITE_OTHER)

## 2023-05-15 DIAGNOSIS — J309 Allergic rhinitis, unspecified: Secondary | ICD-10-CM

## 2023-05-15 LAB — TESTOSTERONE, FREE, TOTAL, SHBG
Sex Hormone Binding: 45.8 nmol/L (ref 17.3–125.0)
Testosterone, Free: 0.2 pg/mL (ref 0.0–4.2)
Testosterone: <3 ng/dL — ABNORMAL LOW (ref 3–67)

## 2023-05-19 ENCOUNTER — Encounter: Payer: Self-pay | Admitting: Obstetrics and Gynecology

## 2023-05-20 NOTE — Telephone Encounter (Signed)
 Dr. Karma Greaser -please review and provide alternative for Intrarosa.

## 2023-05-25 NOTE — Telephone Encounter (Signed)
 Message left to return call to [place] at [phone].

## 2023-06-05 ENCOUNTER — Ambulatory Visit (INDEPENDENT_AMBULATORY_CARE_PROVIDER_SITE_OTHER): Payer: Self-pay

## 2023-06-05 DIAGNOSIS — J309 Allergic rhinitis, unspecified: Secondary | ICD-10-CM

## 2023-06-10 ENCOUNTER — Ambulatory Visit (INDEPENDENT_AMBULATORY_CARE_PROVIDER_SITE_OTHER)

## 2023-06-10 DIAGNOSIS — J309 Allergic rhinitis, unspecified: Secondary | ICD-10-CM | POA: Diagnosis not present

## 2023-06-19 ENCOUNTER — Ambulatory Visit (INDEPENDENT_AMBULATORY_CARE_PROVIDER_SITE_OTHER)

## 2023-06-19 DIAGNOSIS — J309 Allergic rhinitis, unspecified: Secondary | ICD-10-CM | POA: Diagnosis not present

## 2023-06-23 NOTE — Telephone Encounter (Signed)
 Patient returned call. Patient advised of Evenity OOP cost of $750/month. Patient states she cannot pay that each month. Advised would update Dr. Tia Flowers and run benefits for Prolia injections. Patient agreeable.   Insurance information submitted to [employer] portal. Will await summary of benefits for prolia.

## 2023-06-23 NOTE — Telephone Encounter (Signed)
Message left to return call to [place] at [phone].

## 2023-06-30 ENCOUNTER — Other Ambulatory Visit: Payer: Self-pay | Admitting: *Deleted

## 2023-06-30 DIAGNOSIS — M810 Age-related osteoporosis without current pathological fracture: Secondary | ICD-10-CM

## 2023-06-30 MED ORDER — ROMOSOZUMAB-AQQG 105 MG/1.17ML ~~LOC~~ SOSY
210.0000 mg | PREFILLED_SYRINGE | Freq: Once | SUBCUTANEOUS | Status: AC
  Administered 2023-07-29: 210 mg via SUBCUTANEOUS

## 2023-06-30 NOTE — Telephone Encounter (Signed)
 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 Copay card ID: [id number]. See Evenity referral.   Encounter closed.

## 2023-07-01 ENCOUNTER — Ambulatory Visit (INDEPENDENT_AMBULATORY_CARE_PROVIDER_SITE_OTHER)

## 2023-07-01 DIAGNOSIS — J309 Allergic rhinitis, unspecified: Secondary | ICD-10-CM

## 2023-07-09 ENCOUNTER — Encounter (HOSPITAL_COMMUNITY): Payer: Self-pay | Admitting: *Deleted

## 2023-07-09 ENCOUNTER — Other Ambulatory Visit: Payer: Self-pay

## 2023-07-09 ENCOUNTER — Emergency Department (HOSPITAL_COMMUNITY)

## 2023-07-09 ENCOUNTER — Other Ambulatory Visit: Payer: Self-pay | Admitting: Allergy & Immunology

## 2023-07-09 ENCOUNTER — Emergency Department (HOSPITAL_COMMUNITY)
Admission: EM | Admit: 2023-07-09 | Discharge: 2023-07-09 | Disposition: A | Discharge status: Home / Self Care | Attending: Emergency Medicine | Admitting: Emergency Medicine

## 2023-07-09 DIAGNOSIS — Z7951 Long term (current) use of inhaled steroids: Secondary | ICD-10-CM | POA: Diagnosis not present

## 2023-07-09 DIAGNOSIS — J45909 Unspecified asthma, uncomplicated: Secondary | ICD-10-CM | POA: Insufficient documentation

## 2023-07-09 DIAGNOSIS — Z7982 Long term (current) use of aspirin: Secondary | ICD-10-CM | POA: Diagnosis not present

## 2023-07-09 DIAGNOSIS — M79602 Pain in left arm: Secondary | ICD-10-CM | POA: Insufficient documentation

## 2023-07-09 LAB — CBC
HCT: 49.2 % — ABNORMAL HIGH (ref 36.0–46.0)
Hemoglobin: 16 g/dL — ABNORMAL HIGH (ref 12.0–15.0)
MCH: 28.3 pg (ref 26.0–34.0)
MCHC: 32.5 g/dL (ref 30.0–36.0)
MCV: 87.1 fL (ref 80.0–100.0)
Platelets: 277 10*3/uL (ref 150–400)
RBC: 5.65 MIL/uL — ABNORMAL HIGH (ref 3.87–5.11)
RDW: 13.7 % (ref 11.5–15.5)
WBC: 6.2 10*3/uL (ref 4.0–10.5)
nRBC: 0 % (ref 0.0–0.2)

## 2023-07-09 LAB — TROPONIN I (HIGH SENSITIVITY)
Troponin I (High Sensitivity): 3 ng/L (ref <18)
Troponin I (High Sensitivity): 2 ng/L (ref <18)

## 2023-07-09 LAB — BASIC METABOLIC PANEL WITH GFR
Anion gap: 10 (ref 5–15)
BUN: 19 mg/dL (ref 8–23)
CO2: 24 mmol/L (ref 22–32)
Calcium: 10.1 mg/dL (ref 8.9–10.3)
Chloride: 106 mmol/L (ref 98–111)
Creatinine, Ser: 0.66 mg/dL (ref 0.44–1.00)
GFR, Estimated: >60 mL/min (ref >60)
Glucose, Bld: 98 mg/dL (ref 70–99)
Potassium: 3.6 mmol/L (ref 3.5–5.1)
Sodium: 140 mmol/L (ref 135–145)

## 2023-07-09 NOTE — ED Triage Notes (Signed)
 Pt states she is having left arm pain that started a couple of days ago  The pain woke her from sleep and describes the pain as an aching and throbbing pain in her left arm that radiates up her neck   Pt denies any sob, diaphoresis or dizziness

## 2023-07-09 NOTE — ED Provider Notes (Signed)
 [place] EMERGENCY DEPARTMENT AT [facility] Provider Note   CSN: [id number] Arrival date & time: 07/09/23  0751     History  Chief Complaint  Patient presents with   Arm Pain    Allison Frazier is a 63 y.o. female.   Arm Pain  Patient presents with left-sided arm pain.  Goes from shoulder up to her neck.  Had episode yesterday or day before but then woke up today.  More severe.  No injury.  No numbness or weakness.  No chest pain or trouble breathing.      Past Medical History:  Diagnosis Date   Asthma    Complication of anesthesia 1996   epidural worked stronger on left side   High cholesterol    History of kidney stones    Left facial numbness 12/13/2017   worked up at [facility] no cause found   PMB (postmenopausal bleeding)    Syncope 11/11/2017   worked up at [facility]   Upper respiratory infection    finished amoxicillian 09-22-2021, finished steroid dose pack 10-01-2021    Home Medications Prior to Admission medications   Medication Sig Start Date End Date Taking? Authorizing Provider  Albuterol -Budesonide (AIRSUPRA ) 90-80 MCG/ACT AERO Inhale 2 puffs into the lungs every 4 (four) hours as needed. 07/11/22  Yes Rochester Chuck, MD  aspirin  81 MG EC tablet Take by mouth.   Yes [provider]  celecoxib (CELEBREX) 100 MG capsule Take 100 mg by mouth 2 (two) times daily. 01/04/23  Yes [provider]  cetirizine  (ZYRTEC ) 10 MG tablet TAKE 1 TABLET BY MOUTH DAILY AS NEEDED FOR ALLERGIES (CAN TAKE AN EXTRA DOSE DURING FLARE UPS.). Patient taking differently: Take 10 mg by mouth 2 (two) times daily. 12/30/22  Yes Rochester Chuck, MD  Cyanocobalamin (VITAMIN B 12 PO) Take by mouth.   Yes [provider]  doxylamine, Sleep, (UNISOM) 25 MG tablet Take 25 mg by mouth at bedtime.   Yes [provider]  EPINEPHrine  (EPIPEN  2-PAK) 0.3 mg/0.3 mL IJ SOAJ injection Inject 0.3 mg into the muscle as needed for  anaphylaxis. 02/09/23  Yes Patel, Ammie Bale, MD  estradiol  (ESTRACE ) 1 MG tablet Take 0.5 tablets (0.5 mg total) by mouth daily. 1/2 tab daily 04/24/23  Yes Reinaldo Caras, MD  ezetimibe (ZETIA) 10 MG tablet Take 10 mg by mouth daily. 07/10/22  Yes [provider]  fluticasone  furoate-vilanterol (BREO ELLIPTA ) 200-25 MCG/ACT AEPB Inhale 1 puff into the lungs daily. 01/14/23  Yes Rochester Chuck, MD  montelukast  (SINGULAIR ) 10 MG tablet Take 1 tablet (10 mg total) by mouth at bedtime. 01/14/23 07/09/23 Yes Rochester Chuck, MD  oxymetazoline (NASAL RELIEF) 0.05 % nasal spray Place 2 sprays into both nostrils 2 (two) times daily. 04/19/15  Yes [provider]  progesterone  (PROMETRIUM ) 100 MG capsule Take 1 capsule (100 mg total) by mouth at bedtime. 04/24/23  Yes Reinaldo Caras, MD  Semaglutide-Weight Management 0.5 MG/0.5ML SOAJ Semaglutide 2.5 mg + Pyridoxine 10 mg/ml Fill as vials 04/01/23  Yes [provider]      Allergies    Other, Atorvastatin , and Simvastatin    Review of Systems   Review of Systems  Physical Exam Updated Vital Signs BP 128/70   Pulse 62   Temp 97.8 F (36.6 C) (Oral)   Resp 16   Ht 5\' 5"  (1.651 m)   Wt 99.8 kg   LMP 12/29/2012 Comment: BTL  SpO2 96%  BMI 36.61 kg/m  Physical Exam Vitals reviewed.  Cardiovascular:     Rate and Rhythm: Regular rhythm.  Pulmonary:     Breath sounds: No wheezing.  Abdominal:     Tenderness: There is no abdominal tenderness.  Musculoskeletal:        General: No tenderness.  Skin:    General: Skin is warm.  Neurological:     Mental Status: She is alert and oriented to person, place, and time.     ED Results / Procedures / Treatments   Labs (all labs ordered are listed, but only abnormal results are displayed) Labs Reviewed  CBC - Abnormal; Notable for the following components:      Result Value   RBC 5.65 (*)    Hemoglobin 16.0 (*)    HCT 49.2 (*)    All other components  within normal limits  BASIC METABOLIC PANEL WITH GFR  TROPONIN I (HIGH SENSITIVITY)  TROPONIN I (HIGH SENSITIVITY)    EKG EKG Interpretation Date/Time:  Thursday Jul 09 2023 08:07:55 EDT Ventricular Rate:  74 PR Interval:  177 QRS Duration:  93 QT Interval:  372 QTC Calculation: 413 R Axis:   64  Text Interpretation: Sinus rhythm Nonspecific T abnormalities, anterior leads No significant change since last tracing Confirmed by Mozell Arias [phone]) on 07/09/2023 8:45:08 AM  Radiology DG Chest 2 View Result Date: 07/09/2023 CLINICAL DATA:  Chest pain EXAM: CHEST - 2 VIEW COMPARISON:  Chest x-ray performed February 11, 2022 FINDINGS: The heart size and mediastinal contours are unchanged. Both lungs are clear. The visualized skeletal structures are unremarkable. IMPRESSION: No active cardiopulmonary disease. Electronically Signed   By: Reagan Camera M.D.   On: 07/09/2023 09:03    Procedures Procedures    Medications Ordered in ED Medications - No data to display  ED Course/ Medical Decision Making/ A&P                                 Medical Decision Making Amount and/or Complexity of Data Reviewed Labs: ordered. Radiology: ordered.   Patient presents with left arm pain.  Has had for the last couple days on and off.  Differential diagnosis does include musculoskeletal pain but also causes versus atypical angina.  Will get EKG x-ray and basic blood work.  EKG reassuring.  Blood work reassuring.  X-ray reassuring.  Troponin negative x 2.  Doubt cardiac ischemia.  Potential musculoskeletal pain.  Appears stable for discharge home.        Final Clinical Impression(s) / ED Diagnoses Final diagnoses:  Left arm pain    Rx / DC Orders ED Discharge Orders     None         Mozell Arias, MD 07/09/23 1504

## 2023-07-10 ENCOUNTER — Ambulatory Visit (INDEPENDENT_AMBULATORY_CARE_PROVIDER_SITE_OTHER): Payer: Self-pay

## 2023-07-10 DIAGNOSIS — J309 Allergic rhinitis, unspecified: Secondary | ICD-10-CM

## 2023-07-15 DIAGNOSIS — J3081 Allergic rhinitis due to animal (cat) (dog) hair and dander: Secondary | ICD-10-CM | POA: Diagnosis not present

## 2023-07-15 NOTE — Progress Notes (Signed)
 VIALS MADE 07-13-23

## 2023-07-16 DIAGNOSIS — J302 Other seasonal allergic rhinitis: Secondary | ICD-10-CM | POA: Diagnosis not present

## 2023-07-17 ENCOUNTER — Other Ambulatory Visit: Payer: Self-pay

## 2023-07-17 ENCOUNTER — Ambulatory Visit: Payer: BLUE CROSS/BLUE SHIELD | Admitting: Allergy & Immunology

## 2023-07-17 ENCOUNTER — Encounter: Payer: Self-pay | Admitting: Allergy & Immunology

## 2023-07-17 VITALS — BP 120/68 | HR 89 | Temp 98.0°F | Resp 18 | Ht 63.78 in | Wt 220.0 lb

## 2023-07-17 DIAGNOSIS — J302 Other seasonal allergic rhinitis: Secondary | ICD-10-CM | POA: Diagnosis not present

## 2023-07-17 DIAGNOSIS — Z888 Allergy status to other drugs, medicaments and biological substances status: Secondary | ICD-10-CM | POA: Diagnosis not present

## 2023-07-17 DIAGNOSIS — T485X5A Adverse effect of other anti-common-cold drugs, initial encounter: Secondary | ICD-10-CM

## 2023-07-17 DIAGNOSIS — J4540 Moderate persistent asthma, uncomplicated: Secondary | ICD-10-CM

## 2023-07-17 DIAGNOSIS — J3089 Other allergic rhinitis: Secondary | ICD-10-CM

## 2023-07-17 MED ORDER — FLUTICASONE FUROATE-VILANTEROL 200-25 MCG/ACT IN AEPB: 1.0000 | INHALATION_SPRAY | Freq: Every day | RESPIRATORY_TRACT | 1 refills | Status: DC

## 2023-07-17 NOTE — Progress Notes (Signed)
 FOLLOW UP  Date of Service/Encounter:  07/17/23   Assessment:   Seasonal and perennial allergic rhinitis (grasses, ragweed, trees, outdoor molds, and cockroach) - on allergen immunotherapy with maintenance reached April 2025  Overlying rhinitis medicamentosa   Mild intermittent asthma, uncomplicated - doing well with Breo and AirSupra  PRN  Osteoporosis - on romosozumab  (Evenity )  Plan/Recommendations:   1. Seasonal and perennial allergic rhinitis (grasses, ragweed, trees, outdoor molds, and cockroach) - You are in the [employer], so you SHOULD feel better next allergy season if not sooner. - Please use FLONASE with the Sinex to decrease the rebound congestion. - STOP the montelukast  since it does not seem to have helped anyway.  - Continue with the azelastine  two sprays per nostril up to twice daily. - Continue with cetirizine  1-2 times daily. - We could consider a sinus CT if you are interested in checking into that, but I do not want it to be another expense.   2. Mild persistent asthma, uncomplicated - Lung testing looked great today. - We will increase the Breo to 200 mcg instead of the 100 mcg dose.  - Daily controller medication(s): Breo 200/25mcg one puff once daily - Prior to physical activity: AirSupra  two puffs 10-15 minutes before physical activity. - Rescue medications: AirSupra  2 puffs every 4-6 hours as needed - Asthma control goals:  * Full participation in all desired activities (may need albuterol  before activity) * Albuterol  use two time or less a week on average (not counting use with activity) * Cough interfering with sleep two time or less a month * Oral steroids no more than once a year * No hospitalizations  3. Return in about 6 months (around 01/16/2024). You can have the follow up appointment with Dr. Idolina Maker or a Nurse Practicioner (our Nurse Practitioners are excellent and always have Physician oversight!).    Subjective:   Allison Frazier is a  63 y.o. female presenting today for follow up of  Chief Complaint  Patient presents with   Establish Care   Allergies    [place] has a history of the following: Patient Active Problem List   Diagnosis Date Noted   Osteoarthritis of left hip 01/06/2018   Acute focal neurological deficit 12/12/2017   Left facial numbness 12/12/2017   Acute focal neurologic deficit with partial resolution 12/12/2017   Near syncope 02/17/2013   Morbid obesity (HCC) 07/11/2012   Overweight 06/23/2012   Fibroid uterus 05/05/2011   High cholesterol     History obtained from: chart review and patient.  Discussed the use of AI scribe software for clinical note transcription with the patient and/or guardian, who gave verbal consent to proceed.  Allison Frazier is a 63 y.o. female presenting for a follow up visit.  She was last seen in December 2024 at which point she decided to start allergen immunotherapy.  We continued with montelukast  as well as Astelin  and cetirizine .  For her asthma, she was doing great on Breo and Airsupra .  Since the last visit, she has done well.   Asthma/Respiratory Symptom History: She also uses Breo every morning and has been on steroids twice a year, which she notes 'always screws me up.' She has not noticed a significant difference with montelukast .  She has never been on Trelegy.  She is on 100 mcg dose of Breo.  We briefly discussed a biologic, but I do not think we need to go that direction right now. t  Allergic Rhinitis Symptom History: She  experiences persistent nasal congestion despite receiving allergy shots, which she describes as ineffective. The congestion alternates between sides of her nose, which she finds 'so annoying.' She uses a nasal spray, specifically Sonix moisturizer, but has not found relief with Flonase in the past. She has not seen an ear, nose, and throat specialist and recalls having a sinus CT years ago that showed no significant findings. Her current  medications include montelukast , which she takes at night, and cetirizine , which she takes both in the morning and at night due to welts.   She experiences postnasal drip and sneezing, though these symptoms do not bother her significantly. She also reports watery eyes and occasional coughing spasms, particularly when transitioning from cold to warm environments. She describes the cough as a 'big cough' with no productive sputum.  Allison Frazier is on allergen immunotherapy. She receives two injections. Immunotherapy script #1 contains ragweed, molds, and cockroach. She currently receives 0.25mL of the RED vial (1/100). Immunotherapy script #2 contains horse, trees, and grasses. She currently receives 0.25mL of the RED vial (1/100). She started shots December of 2024 and reached maintenance in April of 2025.  She mentions a history of osteoporosis and is concerned about the impact of steroid use on her bone health.  She is not on any medication to treat osteoporosis, which seems to be the left in the past 22 years.  She discusses her insurance situation, noting high premiums and deductibles.   Otherwise, there have been no changes to her past medical history, surgical history, family history, or social history.    Review of systems otherwise negative other than that mentioned in the HPI.    Objective:   Blood pressure 120/68, pulse 89, temperature 98 F (36.7 C), temperature source Temporal, resp. rate 18, height 5' 3.78" (1.62 m), weight 220 lb (99.8 kg), last menstrual period 12/29/2012, SpO2 97%. Body mass index is 38.02 kg/m.    Physical Exam Vitals reviewed.  Constitutional:      Appearance: She is well-developed.     Comments: Delightful.   HENT:     Head: Normocephalic and atraumatic.     Right Ear: Tympanic membrane, ear canal and external ear normal. No drainage, swelling or tenderness. Tympanic membrane is not injected, scarred, erythematous, retracted or bulging.     Left Ear:  Tympanic membrane, ear canal and external ear normal. No drainage, swelling or tenderness. Tympanic membrane is not injected, scarred, erythematous, retracted or bulging.     Nose: No nasal deformity, septal deviation, mucosal edema or rhinorrhea.     Right Turbinates: Enlarged, swollen and pale.     Left Turbinates: Enlarged, swollen and pale.     Right Sinus: No maxillary sinus tenderness or frontal sinus tenderness.     Left Sinus: No maxillary sinus tenderness or frontal sinus tenderness.     Comments: Polypoid sinus degeneration. She continues to have a lot of rhinorrhea.     Mouth/Throat:     Lips: Pink.     Mouth: Mucous membranes are moist. Mucous membranes are not pale and not dry.     Pharynx: Uvula midline.  Eyes:     General: Lids are normal. Allergic shiner present.        Right eye: No discharge.        Left eye: No discharge.     Conjunctiva/sclera: Conjunctivae normal.     Right eye: Right conjunctiva is not injected. No chemosis.    Left eye: Left conjunctiva is not injected. No chemosis.  Pupils: Pupils are equal, round, and reactive to light.  Cardiovascular:     Rate and Rhythm: Normal rate and regular rhythm.     Heart sounds: Normal heart sounds.  Pulmonary:     Effort: Pulmonary effort is normal. No tachypnea, accessory muscle usage or respiratory distress.     Breath sounds: Normal breath sounds. No wheezing, rhonchi or rales.     Comments: Moving air well in all lung fields. No increased work of breathing noted.  Chest:     Chest wall: No tenderness.  Abdominal:     Tenderness: There is no abdominal tenderness. There is no guarding or rebound.  Lymphadenopathy:     Head:     Right side of head: No submandibular, tonsillar or occipital adenopathy.     Left side of head: No submandibular, tonsillar or occipital adenopathy.     Cervical: No cervical adenopathy.  Skin:    General: Skin is warm.     Capillary Refill: Capillary refill takes less than 2  seconds.     Coloration: Skin is not pale.     Findings: No abrasion, erythema, petechiae or rash. Rash is not papular, urticarial or vesicular.  Neurological:     Mental Status: She is alert.  Psychiatric:        Behavior: Behavior is cooperative.      Diagnostic studies:    Spirometry: results normal (FEV1: 2.60/115%, FVC: 3.30/115%, FEV1/FVC: 79%).    Spirometry consistent with normal pattern.   Allergy Studies: none        Drexel Gentles, MD  [facility] 

## 2023-07-17 NOTE — Addendum Note (Signed)
 Addended by: Mollie Anger on: 07/17/2023 02:34 PM   Modules accepted: Orders

## 2023-07-17 NOTE — Patient Instructions (Addendum)
 1. Seasonal and perennial allergic rhinitis (grasses, ragweed, trees, outdoor molds, and cockroach) - You are in the [employer], so you SHOULD feel better next allergy season if not sooner. - Please use FLONASE with the Sinex to decrease the rebound congestion. - STOP the montelukast  since it does not seem to have helped anyway.  - Continue with the azelastine  two sprays per nostril up to twice daily. - Continue with cetirizine  1-2 times daily. - We could consider a sinus CT if you are interested in checking into that, but I do not want it to be another expense.   2. Mild persistent asthma, uncomplicated - Lung testing looked great today. - We will increase the Breo to 200 mcg instead of the 100 mcg dose.  - Daily controller medication(s): Breo 200/25mcg one puff once daily - Prior to physical activity: AirSupra  two puffs 10-15 minutes before physical activity. - Rescue medications: AirSupra  2 puffs every 4-6 hours as needed - Asthma control goals:  * Full participation in all desired activities (may need albuterol  before activity) * Albuterol  use two time or less a week on average (not counting use with activity) * Cough interfering with sleep two time or less a month * Oral steroids no more than once a year * No hospitalizations  3. Return in about 6 months (around 01/16/2024). You can have the follow up appointment with Dr. Idolina Maker or a Nurse Practicioner (our Nurse Practitioners are excellent and always have Physician oversight!).    Please inform us  of any Emergency Department visits, hospitalizations, or changes in symptoms. Call us  before going to the ED for breathing or allergy symptoms since we might be able to fit you in for a sick visit. Feel free to contact us  anytime with any questions, problems, or concerns.  It was a pleasure to see you again today!  Thanks for taking care of all of those babies!   Websites that have reliable patient information: 1. American Academy of  Asthma, Allergy, and Immunology: www.aaaai.org 2. Food Allergy Research and Education (FARE): foodallergy.org 3. Mothers of Asthmatics: http://www.asthmacommunitynetwork.org 4. American College of Allergy, Asthma, and Immunology: www.acaai.org      "Like" us  on Facebook and Instagram for our latest updates!      A healthy democracy works best when [employer] participate! Make sure you are registered to vote! If you have moved or changed any of your contact information, you will need to get this updated before voting! Scan the QR codes below to learn more!

## 2023-07-20 ENCOUNTER — Other Ambulatory Visit: Payer: Self-pay | Admitting: *Deleted

## 2023-07-20 DIAGNOSIS — M810 Age-related osteoporosis without current pathological fracture: Secondary | ICD-10-CM

## 2023-07-20 MED ORDER — ROMOSOZUMAB-AQQG 105 MG/1.17ML ~~LOC~~ SOSY
210.0000 mg | PREFILLED_SYRINGE | SUBCUTANEOUS | Status: AC
  Administered 2023-09-02 – 2024-01-19 (×4): 210 mg via SUBCUTANEOUS

## 2023-07-23 ENCOUNTER — Telehealth: Payer: Self-pay

## 2023-07-23 NOTE — Telephone Encounter (Signed)
-----   Message from Reinaldo Caras sent at 07/23/2023  9:02 AM EDT ----- Showing as overdue on the cologuard.  Can we reach out to her and see if she has heard from the company or if she changed her mind on colon cancer screening testing? Dr. Tia Flowers ----- Message ----- From: SYSTEM Sent: 07/23/2023  12:18 AM EDT To: Reinaldo Caras, MD

## 2023-07-23 NOTE — Telephone Encounter (Signed)
 Spoke to the patient regarding her cologuard test kit.  Patient states she talked to the cologuard company and it was too soon for her to do the test and her insurance would not cover.  Patient's last cologuard was negative 07/27/23.

## 2023-07-24 ENCOUNTER — Ambulatory Visit (INDEPENDENT_AMBULATORY_CARE_PROVIDER_SITE_OTHER)

## 2023-07-24 DIAGNOSIS — J309 Allergic rhinitis, unspecified: Secondary | ICD-10-CM | POA: Diagnosis not present

## 2023-07-29 ENCOUNTER — Ambulatory Visit

## 2023-07-29 DIAGNOSIS — M810 Age-related osteoporosis without current pathological fracture: Secondary | ICD-10-CM

## 2023-07-30 ENCOUNTER — Ambulatory Visit

## 2023-07-31 ENCOUNTER — Ambulatory Visit (INDEPENDENT_AMBULATORY_CARE_PROVIDER_SITE_OTHER): Payer: Self-pay

## 2023-07-31 DIAGNOSIS — J309 Allergic rhinitis, unspecified: Secondary | ICD-10-CM | POA: Diagnosis not present

## 2023-08-05 ENCOUNTER — Ambulatory Visit (INDEPENDENT_AMBULATORY_CARE_PROVIDER_SITE_OTHER)

## 2023-08-05 DIAGNOSIS — J309 Allergic rhinitis, unspecified: Secondary | ICD-10-CM | POA: Diagnosis not present

## 2023-08-19 ENCOUNTER — Ambulatory Visit (INDEPENDENT_AMBULATORY_CARE_PROVIDER_SITE_OTHER)

## 2023-08-19 DIAGNOSIS — J309 Allergic rhinitis, unspecified: Secondary | ICD-10-CM

## 2023-08-28 ENCOUNTER — Ambulatory Visit (INDEPENDENT_AMBULATORY_CARE_PROVIDER_SITE_OTHER): Payer: Self-pay

## 2023-08-28 DIAGNOSIS — J309 Allergic rhinitis, unspecified: Secondary | ICD-10-CM

## 2023-09-02 ENCOUNTER — Ambulatory Visit

## 2023-09-02 DIAGNOSIS — M810 Age-related osteoporosis without current pathological fracture: Secondary | ICD-10-CM | POA: Diagnosis not present

## 2023-09-04 ENCOUNTER — Ambulatory Visit (INDEPENDENT_AMBULATORY_CARE_PROVIDER_SITE_OTHER): Payer: Self-pay

## 2023-09-04 DIAGNOSIS — J309 Allergic rhinitis, unspecified: Secondary | ICD-10-CM

## 2023-09-11 ENCOUNTER — Ambulatory Visit (INDEPENDENT_AMBULATORY_CARE_PROVIDER_SITE_OTHER): Payer: Self-pay

## 2023-09-11 DIAGNOSIS — J309 Allergic rhinitis, unspecified: Secondary | ICD-10-CM | POA: Diagnosis not present

## 2023-09-12 ENCOUNTER — Other Ambulatory Visit: Payer: Self-pay | Admitting: Allergy & Immunology

## 2023-09-18 ENCOUNTER — Ambulatory Visit (INDEPENDENT_AMBULATORY_CARE_PROVIDER_SITE_OTHER): Payer: Self-pay

## 2023-09-18 DIAGNOSIS — J309 Allergic rhinitis, unspecified: Secondary | ICD-10-CM

## 2023-09-28 ENCOUNTER — Other Ambulatory Visit: Payer: Self-pay | Admitting: Allergy & Immunology

## 2023-10-02 ENCOUNTER — Ambulatory Visit (INDEPENDENT_AMBULATORY_CARE_PROVIDER_SITE_OTHER)

## 2023-10-02 DIAGNOSIS — J309 Allergic rhinitis, unspecified: Secondary | ICD-10-CM

## 2023-10-05 ENCOUNTER — Ambulatory Visit

## 2023-10-05 DIAGNOSIS — M810 Age-related osteoporosis without current pathological fracture: Secondary | ICD-10-CM | POA: Insufficient documentation

## 2023-10-05 MED ORDER — ROMOSOZUMAB-AQQG 105 MG/1.17ML ~~LOC~~ SOSY
210.0000 mg | PREFILLED_SYRINGE | SUBCUTANEOUS | Status: AC
  Administered 2023-11-10: 210 mg via SUBCUTANEOUS

## 2023-10-05 NOTE — Progress Notes (Signed)
 3rd Evenity  injection given on each side of the abdomen, as per Patient's request on  10/05/23 - IC, CCMA

## 2023-10-05 NOTE — Progress Notes (Signed)
 2nd Evenity  injection given on each side of the abdomen, as per Patient's request on 09/02/23 - IC, CCMA

## 2023-10-09 ENCOUNTER — Ambulatory Visit (INDEPENDENT_AMBULATORY_CARE_PROVIDER_SITE_OTHER)

## 2023-10-09 DIAGNOSIS — J309 Allergic rhinitis, unspecified: Secondary | ICD-10-CM | POA: Diagnosis not present

## 2023-10-16 ENCOUNTER — Ambulatory Visit (INDEPENDENT_AMBULATORY_CARE_PROVIDER_SITE_OTHER)

## 2023-10-16 DIAGNOSIS — J309 Allergic rhinitis, unspecified: Secondary | ICD-10-CM

## 2023-10-23 ENCOUNTER — Ambulatory Visit (INDEPENDENT_AMBULATORY_CARE_PROVIDER_SITE_OTHER)

## 2023-10-23 DIAGNOSIS — J309 Allergic rhinitis, unspecified: Secondary | ICD-10-CM

## 2023-10-28 ENCOUNTER — Ambulatory Visit (INDEPENDENT_AMBULATORY_CARE_PROVIDER_SITE_OTHER)

## 2023-10-28 DIAGNOSIS — J309 Allergic rhinitis, unspecified: Secondary | ICD-10-CM | POA: Diagnosis not present

## 2023-11-04 ENCOUNTER — Ambulatory Visit (INDEPENDENT_AMBULATORY_CARE_PROVIDER_SITE_OTHER)

## 2023-11-04 DIAGNOSIS — J309 Allergic rhinitis, unspecified: Secondary | ICD-10-CM

## 2023-11-06 ENCOUNTER — Ambulatory Visit

## 2023-11-09 ENCOUNTER — Other Ambulatory Visit: Payer: Self-pay | Admitting: Allergy & Immunology

## 2023-11-09 DIAGNOSIS — J4540 Moderate persistent asthma, uncomplicated: Secondary | ICD-10-CM

## 2023-11-09 DIAGNOSIS — J4520 Mild intermittent asthma, uncomplicated: Secondary | ICD-10-CM

## 2023-11-10 ENCOUNTER — Ambulatory Visit

## 2023-11-10 DIAGNOSIS — M810 Age-related osteoporosis without current pathological fracture: Secondary | ICD-10-CM | POA: Diagnosis not present

## 2023-11-12 ENCOUNTER — Telehealth: Payer: Self-pay

## 2023-11-12 NOTE — Telephone Encounter (Signed)
 Patients husband called and left a vm for the front office.  I called him back and made him aware that the [place] office and the [place] office had no providers in office today. I offered an appt in [place] next week but they wanted to be seen today or tomorrow if at all possible. the [place] and [place] locations as well as [facility]. He advised he would contact [facility]. I provided their number for him to call.   Patient advised if they were unable to see her he would call back and schedule for next week.

## 2023-11-13 ENCOUNTER — Ambulatory Visit (INDEPENDENT_AMBULATORY_CARE_PROVIDER_SITE_OTHER)

## 2023-11-13 DIAGNOSIS — J309 Allergic rhinitis, unspecified: Secondary | ICD-10-CM

## 2023-11-27 ENCOUNTER — Ambulatory Visit (INDEPENDENT_AMBULATORY_CARE_PROVIDER_SITE_OTHER)

## 2023-11-27 DIAGNOSIS — J309 Allergic rhinitis, unspecified: Secondary | ICD-10-CM

## 2023-12-02 ENCOUNTER — Ambulatory Visit (INDEPENDENT_AMBULATORY_CARE_PROVIDER_SITE_OTHER)

## 2023-12-02 DIAGNOSIS — J309 Allergic rhinitis, unspecified: Secondary | ICD-10-CM

## 2023-12-04 ENCOUNTER — Other Ambulatory Visit: Payer: Self-pay | Admitting: Allergy & Immunology

## 2023-12-04 DIAGNOSIS — J4520 Mild intermittent asthma, uncomplicated: Secondary | ICD-10-CM

## 2023-12-04 DIAGNOSIS — J4540 Moderate persistent asthma, uncomplicated: Secondary | ICD-10-CM

## 2023-12-11 ENCOUNTER — Ambulatory Visit (INDEPENDENT_AMBULATORY_CARE_PROVIDER_SITE_OTHER): Payer: Self-pay

## 2023-12-11 DIAGNOSIS — J309 Allergic rhinitis, unspecified: Secondary | ICD-10-CM | POA: Diagnosis not present

## 2023-12-15 ENCOUNTER — Ambulatory Visit (INDEPENDENT_AMBULATORY_CARE_PROVIDER_SITE_OTHER): Admitting: *Deleted

## 2023-12-15 DIAGNOSIS — M810 Age-related osteoporosis without current pathological fracture: Secondary | ICD-10-CM

## 2023-12-17 ENCOUNTER — Other Ambulatory Visit: Payer: Self-pay | Admitting: Allergy & Immunology

## 2023-12-25 ENCOUNTER — Ambulatory Visit (INDEPENDENT_AMBULATORY_CARE_PROVIDER_SITE_OTHER)

## 2023-12-25 DIAGNOSIS — J309 Allergic rhinitis, unspecified: Secondary | ICD-10-CM | POA: Diagnosis not present

## 2023-12-30 ENCOUNTER — Ambulatory Visit (INDEPENDENT_AMBULATORY_CARE_PROVIDER_SITE_OTHER)

## 2023-12-30 DIAGNOSIS — J309 Allergic rhinitis, unspecified: Secondary | ICD-10-CM

## 2023-12-30 DIAGNOSIS — J3089 Other allergic rhinitis: Secondary | ICD-10-CM

## 2024-01-13 ENCOUNTER — Ambulatory Visit

## 2024-01-13 DIAGNOSIS — J309 Allergic rhinitis, unspecified: Secondary | ICD-10-CM

## 2024-01-15 ENCOUNTER — Ambulatory Visit

## 2024-01-19 ENCOUNTER — Ambulatory Visit

## 2024-01-19 DIAGNOSIS — M810 Age-related osteoporosis without current pathological fracture: Secondary | ICD-10-CM | POA: Diagnosis not present

## 2024-01-19 NOTE — Progress Notes (Signed)
 01/19/24:  6th Evenity  injection given SQ on both sides of abdomen. (IC, CCMA)

## 2024-01-20 ENCOUNTER — Encounter: Payer: Self-pay | Admitting: Allergy & Immunology

## 2024-01-20 ENCOUNTER — Ambulatory Visit (INDEPENDENT_AMBULATORY_CARE_PROVIDER_SITE_OTHER)

## 2024-01-20 ENCOUNTER — Other Ambulatory Visit: Payer: Self-pay

## 2024-01-20 ENCOUNTER — Ambulatory Visit: Admitting: Allergy & Immunology

## 2024-01-20 VITALS — BP 126/72 | HR 81 | Temp 98.0°F | Resp 18 | Ht 65.0 in | Wt 244.8 lb

## 2024-01-20 DIAGNOSIS — J4540 Moderate persistent asthma, uncomplicated: Secondary | ICD-10-CM | POA: Diagnosis not present

## 2024-01-20 DIAGNOSIS — J3089 Other allergic rhinitis: Secondary | ICD-10-CM

## 2024-01-20 DIAGNOSIS — J309 Allergic rhinitis, unspecified: Secondary | ICD-10-CM

## 2024-01-20 DIAGNOSIS — J302 Other seasonal allergic rhinitis: Secondary | ICD-10-CM

## 2024-01-20 NOTE — Progress Notes (Signed)
 FOLLOW UP  Date of Service/Encounter:  01/20/24   Assessment:   Seasonal and perennial allergic rhinitis (grasses, ragweed, trees, outdoor molds, and cockroach) - on allergen immunotherapy with maintenance reached April 2025   Overlying rhinitis medicamentosa   Mild intermittent asthma, uncomplicated - doing well with Breo and AirSupra  PRN   Osteoporosis - on romosozumab  (Evenity )  Plan/Recommendations:   1. Seasonal and perennial allergic rhinitis (grasses, ragweed, trees, outdoor molds, and cockroach) - You are in the [employer], so you SHOULD feel better next allergy season if not sooner. - Please use FLONASE with the Sinex to decrease the rebound congestion. - Continue with cetirizine  1-2 times daily. - We are ordering a sinus CT to try to get that in before the end of the year.   2. Mild persistent asthma, uncomplicated - Lung testing not done today. - Daily controller medication(s): Breo 200/25mcg one puff once daily - Prior to physical activity: AirSupra  two puffs 10-15 minutes before physical activity. - Rescue medications: AirSupra  2 puffs every 4-6 hours as needed - Asthma control goals:  * Full participation in all desired activities (may need albuterol  before activity) * Albuterol  use two time or less a week on average (not counting use with activity) * Cough interfering with sleep two time or less a month * Oral steroids no more than once a year * No hospitalizations  3. Return in about 3 months (around 04/19/2024). You can have the follow up appointment with Dr. Iva or a Nurse Practicioner (our Nurse Practitioners are excellent and always have Physician oversight!).   Subjective:   Allison Frazier is a 63 y.o. female presenting today for follow up of  Chief Complaint  Patient presents with   Follow-up    Pt presents to the office for a 6 month follow up    Allison Frazier has a history of the following: Patient Active Problem List   Diagnosis Date  Noted   Age related osteoporosis 10/05/2023   Osteoarthritis of left hip 01/06/2018   Acute focal neurological deficit 12/12/2017   Left facial numbness 12/12/2017   Acute focal neurologic deficit with partial resolution 12/12/2017   Near syncope 02/17/2013   Morbid obesity (HCC) 07/11/2012   Overweight 06/23/2012   Fibroid uterus 05/05/2011   High cholesterol     History obtained from: chart review and patient.  Discussed the use of AI scribe software for clinical note transcription with the patient and/or guardian, who gave verbal consent to proceed.  Allison Frazier is a 63 y.o. female presenting for a follow up visit.  She was last seen in June 2025.  At that time, she was doing well on her red vial.  We continue with Flonase.  We stopped the montelukast  since it was not helping.  We also continue with Astelin  and Zyrtec .  Dr. Doing a sinus CT as well.  For her asthma, lung testing was not well-controlled.  We increased her Breo from 100 mcg to 200 mcg.  Since last visit, she has done well.   Asthma/Respiratory Symptom History: She has not experienced any significant asthma attacks recently, except when exposed to horses, which she avoids due to severe allergic reactions. Her medication regimen includes Breo, which she takes every morning, and she has recently run out of montelukast . She has not needed to use her Air Super inhaler recently.  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   Allergic Rhinitis Symptom History: She experiences ongoing nasal congestion that alternates sides. Despite previous discussions about a sinus CT scan, it has not yet been performed. She has used azelastine  nasal spray, which she finds ineffective, and has tried Flonase and montelukast  without relief. She continues to use Breo daily, although she is uncertain about its effectiveness for her sinus  symptoms.   She notes a reduction in watery eyes, which she attributes to allergy shots, although she is unsure of their overall impact. She has been using Sinex less frequently but still relies on it occasionally.   Allison Frazier is on allergen immunotherapy. She receives two injections. Immunotherapy script #1 contains ragweed, molds, and cockroach. She currently receives 0.5mL of the RED vial (1/100). Immunotherapy script #2 contains horse, trees, and grasses. She currently receives 0.5mL of the RED vial (1/100). She started shots December of 2024 and reached maintenance in April of 2025.  Otherwise, there have been no changes to her past medical history, surgical history, family history, or social history.    Review of systems otherwise negative other than that mentioned in the HPI.    Objective:   Blood pressure 126/72, pulse 81, temperature 98 F (36.7 C), temperature source Temporal, resp. rate 18, height 5' 5 (1.651 m), weight 244 lb 12.8 oz (111 kg), last menstrual period 12/29/2012, SpO2 97%. Body mass index is 40.74 kg/m.    Physical Exam Vitals reviewed.  Constitutional:      Appearance: She is well-developed.     Comments: Allison Frazier.  Smiling.  HENT:     Head: Normocephalic and atraumatic.     Right Ear: Tympanic membrane, ear canal and external ear normal. No drainage, swelling or tenderness. Tympanic membrane is not injected, scarred, erythematous, retracted or bulging.     Left Ear: Tympanic membrane, ear canal and external ear normal. No drainage, swelling or tenderness. Tympanic membrane is not injected, scarred, erythematous, retracted or bulging.     Nose: No nasal deformity, septal deviation, mucosal edema or rhinorrhea.     Right Turbinates: Enlarged, swollen and pale.     Left Turbinates: Enlarged, swollen and pale.     Right Sinus: No maxillary sinus tenderness or frontal sinus tenderness.     Left Sinus: No maxillary sinus tenderness or frontal sinus  tenderness.     Comments: Polypoid sinus degeneration. She continues to have a lot of rhinorrhea.     Mouth/Throat:     Lips: Pink.     Mouth: Mucous membranes are moist. Mucous membranes are not pale and not dry.     Pharynx: Uvula midline.     Comments: Cobblestoning of the posterior oropharynx. Eyes:     General: Lids are normal. Allergic shiner present.        Right eye: No discharge.        Left eye: No discharge.     Conjunctiva/sclera: Conjunctivae normal.     Right eye: Right conjunctiva is not injected. No chemosis.    Left eye: Left conjunctiva is not injected. No chemosis.    Pupils: Pupils are equal, round, and reactive to light.  Cardiovascular:     Rate and Rhythm: Normal rate and regular rhythm.     Heart sounds: Normal heart sounds.  Pulmonary:     Effort: Pulmonary effort is normal. No tachypnea, accessory muscle usage or respiratory distress.     Breath sounds: Normal breath sounds. No wheezing, rhonchi or rales.  Comments: Moving air well in all lung fields. No increased work of breathing noted.  Chest:     Chest wall: No tenderness.  Abdominal:     Tenderness: There is no abdominal tenderness. There is no guarding or rebound.  Lymphadenopathy:     Head:     Right side of head: No submandibular, tonsillar or occipital adenopathy.     Left side of head: No submandibular, tonsillar or occipital adenopathy.     Cervical: No cervical adenopathy.  Skin:    General: Skin is warm.     Capillary Refill: Capillary refill takes less than 2 seconds.     Coloration: Skin is not pale.     Findings: No abrasion, erythema, petechiae or rash. Rash is not papular, urticarial or vesicular.  Neurological:     Mental Status: She is alert.  Psychiatric:        Behavior: Behavior is cooperative.      Diagnostic studies: none       Marty Shaggy, MD  [facility] 

## 2024-01-20 NOTE — Patient Instructions (Addendum)
 1. Seasonal and perennial allergic rhinitis (grasses, ragweed, trees, outdoor molds, and cockroach) - You are in the [employer], so you SHOULD feel better next allergy season if not sooner. - Please use FLONASE with the Sinex to decrease the rebound congestion. - Continue with cetirizine  1-2 times daily. - We are ordering a sinus CT to try to get that in before the end of the year.   2. Mild persistent asthma, uncomplicated - Lung testing not done today. - Daily controller medication(s): Breo 200/25mcg one puff once daily - Prior to physical activity: AirSupra  two puffs 10-15 minutes before physical activity. - Rescue medications: AirSupra  2 puffs every 4-6 hours as needed - Asthma control goals:  * Full participation in all desired activities (may need albuterol  before activity) * Albuterol  use two time or less a week on average (not counting use with activity) * Cough interfering with sleep two time or less a month * Oral steroids no more than once a year * No hospitalizations  3. Return in about 3 months (around 04/19/2024). You can have the follow up appointment with Dr. Iva or a Nurse Practicioner (our Nurse Practitioners are excellent and always have Physician oversight!).    Please inform us  of any Emergency Department visits, hospitalizations, or changes in symptoms. Call us  before going to the ED for breathing or allergy symptoms since we might be able to fit you in for a sick visit. Feel free to contact us  anytime with any questions, problems, or concerns.  It was a pleasure to see you again today!   Websites that have reliable patient information: 1. American Academy of Asthma, Allergy, and Immunology: www.aaaai.org 2. Food Allergy Research and Education (FARE): foodallergy.org 3. Mothers of Asthmatics: http://www.asthmacommunitynetwork.org 4. American College of Allergy, Asthma, and Immunology: www.acaai.org      Like us  on [employer] and Instagram for our latest  updates!      A healthy democracy works best when [employer] participate! Make sure you are registered to vote! If you have moved or changed any of your contact information, you will need to get this updated before voting! Scan the QR codes below to learn more!

## 2024-01-25 DIAGNOSIS — J3081 Allergic rhinitis due to animal (cat) (dog) hair and dander: Secondary | ICD-10-CM | POA: Diagnosis not present

## 2024-01-25 DIAGNOSIS — J301 Allergic rhinitis due to pollen: Secondary | ICD-10-CM | POA: Diagnosis not present

## 2024-01-25 DIAGNOSIS — J302 Other seasonal allergic rhinitis: Secondary | ICD-10-CM | POA: Diagnosis not present

## 2024-01-25 NOTE — Progress Notes (Signed)
 VIALS MADE ON 01/25/24

## 2024-02-01 ENCOUNTER — Ambulatory Visit

## 2024-02-01 DIAGNOSIS — J309 Allergic rhinitis, unspecified: Secondary | ICD-10-CM

## 2024-02-17 ENCOUNTER — Other Ambulatory Visit: Payer: Self-pay | Admitting: *Deleted

## 2024-02-17 ENCOUNTER — Encounter: Payer: Self-pay | Admitting: *Deleted

## 2024-02-17 DIAGNOSIS — M810 Age-related osteoporosis without current pathological fracture: Secondary | ICD-10-CM

## 2024-02-17 MED ORDER — ROMOSOZUMAB-AQQG 105 MG/1.17ML ~~LOC~~ SOSY
210.0000 mg | PREFILLED_SYRINGE | SUBCUTANEOUS | Status: AC
  Administered 2024-03-09: 210 mg via SUBCUTANEOUS

## 2024-02-19 ENCOUNTER — Ambulatory Visit

## 2024-02-19 ENCOUNTER — Other Ambulatory Visit (HOSPITAL_COMMUNITY): Payer: Self-pay

## 2024-02-19 ENCOUNTER — Telehealth: Payer: Self-pay

## 2024-02-19 DIAGNOSIS — J302 Other seasonal allergic rhinitis: Secondary | ICD-10-CM

## 2024-02-19 NOTE — Telephone Encounter (Signed)
 Evenity  VOB initiated via [email or web address]  Last OV:  Next OV:  Last Evenity  inj: 01/19/24 Next Evenity  inj DUE: NOW

## 2024-02-25 ENCOUNTER — Other Ambulatory Visit (HOSPITAL_COMMUNITY): Payer: Self-pay

## 2024-02-25 NOTE — Telephone Encounter (Signed)
 SABRA

## 2024-02-25 NOTE — Telephone Encounter (Addendum)
 PHARMACY PA SUBMITTED VIA LATENT. KEY: [id number]

## 2024-02-26 LAB — COLOGUARD: COLOGUARD: NEGATIVE

## 2024-02-26 NOTE — Telephone Encounter (Signed)
 Buy/Bill (Office supplied medication)  Out-of-pocket cost due at time of clinic visit: $1050  Number of injection/visits approved: ---  Primary: [employer] Co-insurance: $1050 Admin fee co-insurance: 0%  Secondary: --- Co-insurance:  Admin fee co-insurance:   Medical Benefit Details: Date Benefits were checked: 02/25/24 Deductible: NO/ Coinsurance: $1050/ Admin Fee: 0%  Prior Auth: N/A PA# Expiration Date:   # of doses approved: -----------------------------------------------------------------------  Patient IS eligible for Copay Card. Copay Card can make patient's cost as little as $25. Link to apply: [email or web address]://[email or web address].[email or web address]  ** This summary of benefits is an estimation of the patient's out-of-pocket cost. Exact cost may very based on individual plan coverage.

## 2024-02-28 ENCOUNTER — Ambulatory Visit: Payer: Self-pay | Admitting: Obstetrics and Gynecology

## 2024-03-01 NOTE — Telephone Encounter (Signed)
 See referral

## 2024-03-02 ENCOUNTER — Ambulatory Visit

## 2024-03-02 DIAGNOSIS — J302 Other seasonal allergic rhinitis: Secondary | ICD-10-CM

## 2024-03-09 ENCOUNTER — Ambulatory Visit (INDEPENDENT_AMBULATORY_CARE_PROVIDER_SITE_OTHER)

## 2024-03-09 DIAGNOSIS — M810 Age-related osteoporosis without current pathological fracture: Secondary | ICD-10-CM | POA: Diagnosis not present

## 2024-03-10 NOTE — Progress Notes (Signed)
 03/09/24: 7th Evenity  - injections given SQ on both sides of abdomen. Allison Frazier

## 2024-03-11 ENCOUNTER — Ambulatory Visit (INDEPENDENT_AMBULATORY_CARE_PROVIDER_SITE_OTHER)

## 2024-03-11 ENCOUNTER — Other Ambulatory Visit: Payer: Self-pay | Admitting: Allergy & Immunology

## 2024-03-11 DIAGNOSIS — J302 Other seasonal allergic rhinitis: Secondary | ICD-10-CM | POA: Diagnosis not present

## 2024-03-18 ENCOUNTER — Ambulatory Visit (INDEPENDENT_AMBULATORY_CARE_PROVIDER_SITE_OTHER)

## 2024-03-18 DIAGNOSIS — J302 Other seasonal allergic rhinitis: Secondary | ICD-10-CM

## 2024-04-12 ENCOUNTER — Ambulatory Visit

## 2024-04-22 ENCOUNTER — Ambulatory Visit: Admitting: Family Medicine

## 2024-04-26 ENCOUNTER — Ambulatory Visit: Admitting: Obstetrics and Gynecology
